# Patient Record
Sex: Female | Born: 1975 | Race: Black or African American | Hispanic: No | Smoking: Current some day smoker
Health system: Southern US, Community
[De-identification: ages and names within clinical notes are randomized; demographics above are authoritative.]

## PROBLEM LIST (undated history)

## (undated) DIAGNOSIS — R569 Unspecified convulsions: Secondary | ICD-10-CM

## (undated) DIAGNOSIS — R011 Cardiac murmur, unspecified: Secondary | ICD-10-CM

## (undated) DIAGNOSIS — Z87442 Personal history of urinary calculi: Secondary | ICD-10-CM

## (undated) DIAGNOSIS — R51 Headache: Secondary | ICD-10-CM

## (undated) DIAGNOSIS — J45909 Unspecified asthma, uncomplicated: Secondary | ICD-10-CM

## (undated) DIAGNOSIS — D649 Anemia, unspecified: Secondary | ICD-10-CM

## (undated) DIAGNOSIS — F32A Depression, unspecified: Secondary | ICD-10-CM

## (undated) DIAGNOSIS — R519 Headache, unspecified: Secondary | ICD-10-CM

## (undated) DIAGNOSIS — F329 Major depressive disorder, single episode, unspecified: Secondary | ICD-10-CM

## (undated) DIAGNOSIS — L732 Hidradenitis suppurativa: Secondary | ICD-10-CM

## (undated) HISTORY — PX: BACK SURGERY: SHX140

## (undated) HISTORY — DX: Hidradenitis suppurativa: L73.2

## (undated) HISTORY — PX: TUBAL LIGATION: SHX77

## (undated) HISTORY — PX: KIDNEY STONE SURGERY: SHX686

## (undated) HISTORY — PX: CHOLECYSTECTOMY: SHX55

---

## 1995-07-06 HISTORY — PX: APPENDECTOMY: SHX54

## 1995-07-06 HISTORY — PX: KNEE SURGERY: SHX244

## 2005-07-05 DIAGNOSIS — L732 Hidradenitis suppurativa: Secondary | ICD-10-CM

## 2005-07-05 HISTORY — DX: Hidradenitis suppurativa: L73.2

## 2005-07-05 HISTORY — PX: AXILLARY HIDRADENITIS EXCISION: SUR522

## 2007-02-27 ENCOUNTER — Emergency Department: Payer: Self-pay | Admitting: Emergency Medicine

## 2007-06-29 ENCOUNTER — Emergency Department: Payer: Self-pay | Admitting: Emergency Medicine

## 2007-08-07 ENCOUNTER — Emergency Department: Payer: Self-pay | Admitting: Emergency Medicine

## 2008-06-09 ENCOUNTER — Emergency Department: Payer: Self-pay | Admitting: Emergency Medicine

## 2009-04-19 ENCOUNTER — Emergency Department: Payer: Self-pay | Admitting: Internal Medicine

## 2009-09-25 ENCOUNTER — Emergency Department: Payer: Self-pay | Admitting: Emergency Medicine

## 2009-10-21 ENCOUNTER — Ambulatory Visit: Payer: Self-pay | Admitting: Orthopedic Surgery

## 2010-10-07 ENCOUNTER — Ambulatory Visit: Payer: Self-pay | Admitting: Internal Medicine

## 2011-01-18 ENCOUNTER — Ambulatory Visit: Payer: Self-pay | Admitting: Family

## 2011-02-01 ENCOUNTER — Ambulatory Visit: Payer: Self-pay | Admitting: Family

## 2012-05-25 ENCOUNTER — Emergency Department: Payer: Self-pay | Admitting: Emergency Medicine

## 2012-05-25 LAB — HCG, QUANTITATIVE, PREGNANCY: Beta Hcg, Quant.: <1 m[IU]/mL — ABNORMAL LOW

## 2012-05-25 LAB — ETHANOL
Ethanol %: 0.087 % — ABNORMAL HIGH (ref 0.000–0.080)
Ethanol: 87 mg/dL

## 2012-06-15 ENCOUNTER — Emergency Department: Payer: Self-pay | Admitting: Emergency Medicine

## 2012-06-15 LAB — COMPREHENSIVE METABOLIC PANEL
Albumin: 3.4 g/dL (ref 3.4–5.0)
Anion Gap: 10 (ref 7–16)
BUN: 6 mg/dL — ABNORMAL LOW (ref 7–18)
Bilirubin,Total: 0.2 mg/dL (ref 0.2–1.0)
Chloride: 111 mmol/L — ABNORMAL HIGH (ref 98–107)
Creatinine: 0.56 mg/dL — ABNORMAL LOW (ref 0.60–1.30)
EGFR (African American): >60
EGFR (Non-African Amer.): >60
Glucose: 82 mg/dL (ref 65–99)
Potassium: 3.9 mmol/L (ref 3.5–5.1)
SGOT(AST): 23 U/L (ref 15–37)
Sodium: 142 mmol/L (ref 136–145)
Total Protein: 8 g/dL (ref 6.4–8.2)

## 2012-06-15 LAB — CBC
HGB: 11.8 g/dL — ABNORMAL LOW (ref 12.0–16.0)
MCH: 32.5 pg (ref 26.0–34.0)
MCV: 95 fL (ref 80–100)
Platelet: 214 10*3/uL (ref 150–440)
RBC: 3.64 10*6/uL — ABNORMAL LOW (ref 3.80–5.20)
WBC: 10.1 10*3/uL (ref 3.6–11.0)

## 2012-06-15 LAB — ETHANOL: Ethanol %: 0.162 % — ABNORMAL HIGH (ref 0.000–0.080)

## 2012-09-04 ENCOUNTER — Ambulatory Visit: Payer: Self-pay | Admitting: Emergency Medicine

## 2012-09-04 LAB — PREGNANCY, URINE: Pregnancy Test, Urine: NEGATIVE m[IU]/mL

## 2012-10-19 ENCOUNTER — Emergency Department: Payer: Self-pay | Admitting: Emergency Medicine

## 2012-10-19 LAB — CK TOTAL AND CKMB (NOT AT ARMC): CK-MB: <0.5 ng/mL — ABNORMAL LOW (ref 0.5–3.6)

## 2012-10-19 LAB — BASIC METABOLIC PANEL
Anion Gap: 7 (ref 7–16)
BUN: 5 mg/dL — ABNORMAL LOW (ref 7–18)
Calcium, Total: 9.1 mg/dL (ref 8.5–10.1)
Chloride: 109 mmol/L — ABNORMAL HIGH (ref 98–107)
Co2: 23 mmol/L (ref 21–32)
Creatinine: 0.81 mg/dL (ref 0.60–1.30)
EGFR (African American): >60
EGFR (Non-African Amer.): >60
Glucose: 86 mg/dL (ref 65–99)
Osmolality: 274 (ref 275–301)
Sodium: 139 mmol/L (ref 136–145)

## 2012-10-19 LAB — TROPONIN I: Troponin-I: <0.02 ng/mL

## 2012-10-19 LAB — CBC
HCT: 37.1 % (ref 35.0–47.0)
Platelet: 264 10*3/uL (ref 150–440)
RBC: 3.88 10*6/uL (ref 3.80–5.20)
RDW: 13.5 % (ref 11.5–14.5)
WBC: 11.3 10*3/uL — ABNORMAL HIGH (ref 3.6–11.0)

## 2013-01-06 ENCOUNTER — Emergency Department: Payer: Self-pay | Admitting: Emergency Medicine

## 2013-04-24 ENCOUNTER — Emergency Department: Payer: Self-pay | Admitting: Emergency Medicine

## 2013-04-24 LAB — CBC
HCT: 38.8 % (ref 35.0–47.0)
MCH: 33.7 pg (ref 26.0–34.0)
MCHC: 34.8 g/dL (ref 32.0–36.0)
MCV: 97 fL (ref 80–100)
Platelet: 230 10*3/uL (ref 150–440)
RBC: 4 10*6/uL (ref 3.80–5.20)
WBC: 16.5 10*3/uL — ABNORMAL HIGH (ref 3.6–11.0)

## 2013-04-24 LAB — BASIC METABOLIC PANEL
BUN: 6 mg/dL — ABNORMAL LOW (ref 7–18)
Calcium, Total: 8.4 mg/dL — ABNORMAL LOW (ref 8.5–10.1)
Chloride: 109 mmol/L — ABNORMAL HIGH (ref 98–107)
Co2: 19 mmol/L — ABNORMAL LOW (ref 21–32)
Creatinine: 0.77 mg/dL (ref 0.60–1.30)
EGFR (African American): >60
EGFR (Non-African Amer.): >60
Glucose: 86 mg/dL (ref 65–99)
Osmolality: 271 (ref 275–301)
Potassium: 3.5 mmol/L (ref 3.5–5.1)
Sodium: 137 mmol/L (ref 136–145)

## 2013-04-28 ENCOUNTER — Ambulatory Visit: Payer: Self-pay | Admitting: Family Medicine

## 2013-10-05 ENCOUNTER — Ambulatory Visit: Payer: Self-pay | Admitting: Physician Assistant

## 2013-10-05 DIAGNOSIS — R059 Cough, unspecified: Secondary | ICD-10-CM | POA: Diagnosis not present

## 2013-10-05 DIAGNOSIS — J45909 Unspecified asthma, uncomplicated: Secondary | ICD-10-CM | POA: Diagnosis not present

## 2013-10-05 DIAGNOSIS — Z79899 Other long term (current) drug therapy: Secondary | ICD-10-CM | POA: Diagnosis not present

## 2013-10-05 DIAGNOSIS — G8929 Other chronic pain: Secondary | ICD-10-CM | POA: Diagnosis not present

## 2013-10-05 DIAGNOSIS — F17200 Nicotine dependence, unspecified, uncomplicated: Secondary | ICD-10-CM | POA: Diagnosis not present

## 2013-10-05 DIAGNOSIS — R05 Cough: Secondary | ICD-10-CM | POA: Diagnosis not present

## 2013-10-05 DIAGNOSIS — J069 Acute upper respiratory infection, unspecified: Secondary | ICD-10-CM | POA: Diagnosis not present

## 2013-11-05 DIAGNOSIS — M545 Low back pain, unspecified: Secondary | ICD-10-CM | POA: Diagnosis not present

## 2013-11-05 DIAGNOSIS — J449 Chronic obstructive pulmonary disease, unspecified: Secondary | ICD-10-CM | POA: Diagnosis not present

## 2013-11-05 DIAGNOSIS — J019 Acute sinusitis, unspecified: Secondary | ICD-10-CM | POA: Diagnosis not present

## 2013-11-05 DIAGNOSIS — R51 Headache: Secondary | ICD-10-CM | POA: Diagnosis not present

## 2013-11-27 ENCOUNTER — Ambulatory Visit: Payer: Self-pay | Admitting: Physician Assistant

## 2013-11-27 DIAGNOSIS — Z79899 Other long term (current) drug therapy: Secondary | ICD-10-CM | POA: Diagnosis not present

## 2013-11-27 DIAGNOSIS — S060X9A Concussion with loss of consciousness of unspecified duration, initial encounter: Secondary | ICD-10-CM | POA: Diagnosis not present

## 2013-11-27 DIAGNOSIS — H9319 Tinnitus, unspecified ear: Secondary | ICD-10-CM | POA: Diagnosis not present

## 2013-11-27 DIAGNOSIS — S060XAA Concussion with loss of consciousness status unknown, initial encounter: Secondary | ICD-10-CM | POA: Diagnosis not present

## 2013-11-27 DIAGNOSIS — R42 Dizziness and giddiness: Secondary | ICD-10-CM | POA: Diagnosis not present

## 2013-11-27 DIAGNOSIS — J45909 Unspecified asthma, uncomplicated: Secondary | ICD-10-CM | POA: Diagnosis not present

## 2013-11-27 DIAGNOSIS — S0990XA Unspecified injury of head, initial encounter: Secondary | ICD-10-CM | POA: Diagnosis not present

## 2013-11-27 DIAGNOSIS — Z9851 Tubal ligation status: Secondary | ICD-10-CM | POA: Diagnosis not present

## 2013-11-27 DIAGNOSIS — R51 Headache: Secondary | ICD-10-CM | POA: Diagnosis not present

## 2013-11-27 DIAGNOSIS — H538 Other visual disturbances: Secondary | ICD-10-CM | POA: Diagnosis not present

## 2013-11-27 DIAGNOSIS — Z9089 Acquired absence of other organs: Secondary | ICD-10-CM | POA: Diagnosis not present

## 2013-12-07 ENCOUNTER — Emergency Department: Payer: Self-pay | Admitting: Emergency Medicine

## 2013-12-07 DIAGNOSIS — IMO0002 Reserved for concepts with insufficient information to code with codable children: Secondary | ICD-10-CM | POA: Diagnosis not present

## 2013-12-07 DIAGNOSIS — Z79899 Other long term (current) drug therapy: Secondary | ICD-10-CM | POA: Diagnosis not present

## 2013-12-07 DIAGNOSIS — Z9089 Acquired absence of other organs: Secondary | ICD-10-CM | POA: Diagnosis not present

## 2013-12-07 DIAGNOSIS — M5430 Sciatica, unspecified side: Secondary | ICD-10-CM | POA: Diagnosis not present

## 2013-12-07 DIAGNOSIS — F17200 Nicotine dependence, unspecified, uncomplicated: Secondary | ICD-10-CM | POA: Diagnosis not present

## 2013-12-19 ENCOUNTER — Emergency Department: Payer: Self-pay | Admitting: Internal Medicine

## 2013-12-19 DIAGNOSIS — L02219 Cutaneous abscess of trunk, unspecified: Secondary | ICD-10-CM | POA: Diagnosis not present

## 2013-12-19 DIAGNOSIS — R52 Pain, unspecified: Secondary | ICD-10-CM | POA: Diagnosis not present

## 2013-12-19 DIAGNOSIS — J45909 Unspecified asthma, uncomplicated: Secondary | ICD-10-CM | POA: Diagnosis not present

## 2013-12-19 DIAGNOSIS — R109 Unspecified abdominal pain: Secondary | ICD-10-CM | POA: Diagnosis not present

## 2013-12-19 DIAGNOSIS — Z9089 Acquired absence of other organs: Secondary | ICD-10-CM | POA: Diagnosis not present

## 2013-12-19 DIAGNOSIS — Z79899 Other long term (current) drug therapy: Secondary | ICD-10-CM | POA: Diagnosis not present

## 2013-12-19 DIAGNOSIS — R1110 Vomiting, unspecified: Secondary | ICD-10-CM | POA: Diagnosis not present

## 2013-12-19 DIAGNOSIS — N949 Unspecified condition associated with female genital organs and menstrual cycle: Secondary | ICD-10-CM | POA: Diagnosis not present

## 2013-12-19 LAB — CBC WITH DIFFERENTIAL/PLATELET
BASOS ABS: 0.1 10*3/uL (ref 0.0–0.1)
Basophil %: 0.9 %
EOS PCT: 1.8 %
Eosinophil #: 0.2 10*3/uL (ref 0.0–0.7)
HCT: 39.8 % (ref 35.0–47.0)
HGB: 13.4 g/dL (ref 12.0–16.0)
LYMPHS PCT: 20.8 %
Lymphocyte #: 2.7 10*3/uL (ref 1.0–3.6)
MCH: 32.7 pg (ref 26.0–34.0)
MCHC: 33.6 g/dL (ref 32.0–36.0)
MCV: 97 fL (ref 80–100)
Monocyte #: 0.7 x10 3/mm (ref 0.2–0.9)
Monocyte %: 5.6 %
Neutrophil #: 9.1 10*3/uL — ABNORMAL HIGH (ref 1.4–6.5)
Neutrophil %: 70.9 %
PLATELETS: 274 10*3/uL (ref 150–440)
RBC: 4.09 10*6/uL (ref 3.80–5.20)
RDW: 12.5 % (ref 11.5–14.5)
WBC: 12.9 10*3/uL — AB (ref 3.6–11.0)

## 2013-12-19 LAB — COMPREHENSIVE METABOLIC PANEL
ALBUMIN: 3.5 g/dL (ref 3.4–5.0)
ALK PHOS: 115 U/L
Anion Gap: 8 (ref 7–16)
BUN: 5 mg/dL — AB (ref 7–18)
Bilirubin,Total: 0.6 mg/dL (ref 0.2–1.0)
CALCIUM: 9.3 mg/dL (ref 8.5–10.1)
Chloride: 102 mmol/L (ref 98–107)
Co2: 24 mmol/L (ref 21–32)
Creatinine: 0.81 mg/dL (ref 0.60–1.30)
EGFR (African American): >60
Glucose: 84 mg/dL (ref 65–99)
Osmolality: 265 (ref 275–301)
Potassium: 3.7 mmol/L (ref 3.5–5.1)
SGOT(AST): 14 U/L — ABNORMAL LOW (ref 15–37)
SGPT (ALT): 11 U/L — ABNORMAL LOW (ref 12–78)
Sodium: 134 mmol/L — ABNORMAL LOW (ref 136–145)
Total Protein: 8.5 g/dL — ABNORMAL HIGH (ref 6.4–8.2)

## 2013-12-19 LAB — URINALYSIS, COMPLETE
BACTERIA: NONE SEEN
Bilirubin,UR: NEGATIVE
Blood: NEGATIVE
Glucose,UR: NEGATIVE mg/dL (ref 0–75)
Nitrite: NEGATIVE
PH: 5 (ref 4.5–8.0)
PROTEIN: NEGATIVE
RBC,UR: 7 /HPF (ref 0–5)
SPECIFIC GRAVITY: 1.021 (ref 1.003–1.030)

## 2013-12-19 LAB — TROPONIN I

## 2013-12-19 LAB — LIPASE, BLOOD: LIPASE: 86 U/L (ref 73–393)

## 2014-02-25 DIAGNOSIS — M5137 Other intervertebral disc degeneration, lumbosacral region: Secondary | ICD-10-CM | POA: Diagnosis not present

## 2014-02-25 DIAGNOSIS — R0789 Other chest pain: Secondary | ICD-10-CM | POA: Diagnosis not present

## 2014-02-25 DIAGNOSIS — R079 Chest pain, unspecified: Secondary | ICD-10-CM | POA: Diagnosis not present

## 2014-03-01 DIAGNOSIS — N946 Dysmenorrhea, unspecified: Secondary | ICD-10-CM | POA: Diagnosis not present

## 2014-03-26 DIAGNOSIS — L0501 Pilonidal cyst with abscess: Secondary | ICD-10-CM | POA: Diagnosis not present

## 2014-04-02 ENCOUNTER — Encounter: Payer: Self-pay | Admitting: General Surgery

## 2014-04-02 ENCOUNTER — Ambulatory Visit (INDEPENDENT_AMBULATORY_CARE_PROVIDER_SITE_OTHER): Payer: Medicare Other | Admitting: General Surgery

## 2014-04-02 VITALS — BP 122/78 | HR 78 | Resp 14 | Ht 64.0 in | Wt 197.0 lb

## 2014-04-02 DIAGNOSIS — L0501 Pilonidal cyst with abscess: Secondary | ICD-10-CM | POA: Diagnosis not present

## 2014-04-02 DIAGNOSIS — L732 Hidradenitis suppurativa: Secondary | ICD-10-CM

## 2014-04-02 NOTE — Progress Notes (Signed)
Patient ID: Melissa [name], female   DOB: 09/10/1975, 38 y.o.   MRN: [id number]  Chief Complaint  Patient presents with  . Other    New Pt evaluation abscess on tailbone    HPI Melissa [name] is a 38 y.o. female here today for a evaluation of a abscess on tailbone.  Patient is on Keflex four times daily. The patient states she noticed on Sunday 03/24/14. She complains of pain in this area. The area feels like it is starting to get larger in size. She has had some drainage in this area. She also states she has an area located in the pubic area. The left pubic area has been more chronic.   HPI  Past Medical History  Diagnosis Date  . Myocardial infarction 2014  . Hidradenitis 2007    Past Surgical History  Procedure Laterality Date  . Appendectomy  1997  . Knee surgery Left 1997  . Axillary hidradenitis excision Bilateral 2007    History reviewed. No pertinent family history.  Social History History  Substance Use Topics  . Smoking status: Current Every Day Smoker -- 1.00 packs/day for 10 years  . Smokeless tobacco: Not on file  . Alcohol Use: Yes    Allergies  Allergen Reactions  . Tylenol [Acetaminophen] Hives    Current Outpatient Prescriptions  Medication Sig Dispense Refill  . cephALEXin (KEFLEX) 500 MG capsule Take 1 capsule by mouth 4 (four) times daily. For 10 days      . HYDROcodone-acetaminophen (NORCO) 7.5-325 MG per tablet Take 1 tablet by mouth 2 (two) times daily.      . naproxen sodium (ANAPROX) 220 MG tablet Take 440 mg by mouth 2 (two) times daily with a meal.      . traMADol (ULTRAM) 50 MG tablet Take 1 tablet by mouth 2 (two) times daily.       No current facility-administered medications for this visit.    Review of Systems Review of Systems  Constitutional: Negative.   Respiratory: Negative.   Cardiovascular: Negative.     Blood pressure 122/78, pulse 78, resp. rate 14, height 5\' 4"  (1.626 m), weight 197 lb (89.359 kg), last menstrual period  04/02/2014.  Physical Exam Physical Exam  Constitutional: She is oriented to person, place, and time. She appears well-developed and well-nourished.  Lymphadenopathy:       Right: No inguinal adenopathy present.       Left: No inguinal adenopathy present.  Neurological: She is alert and oriented to person, place, and time.   Small area of hidradenits in left pubic area.  Has a 4cm induration with soft middle in upper gluteal cleft.  Data Reviewed PCP notes  Assessment    Pilonidal abscess and focal hidradenitis pubic area. Rcommended drainage of pilonidal abscess and completed with her consent.    Plan   2 ml 1% xylocaine used. Betadine prep. Cruciate incision made and pus and blood drained.  No immediate problems from procedure.          [name] 04/03/2014, 11:19 AM

## 2014-04-02 NOTE — Patient Instructions (Signed)
Patient to return in 3 weeks for follow up. The patient is aware to call back for any questions or concerns.  

## 2014-04-03 ENCOUNTER — Encounter: Payer: Self-pay | Admitting: General Surgery

## 2014-04-05 DIAGNOSIS — L0502 Pilonidal sinus with abscess: Secondary | ICD-10-CM | POA: Diagnosis not present

## 2014-04-24 ENCOUNTER — Ambulatory Visit: Payer: Medicare Other | Admitting: General Surgery

## 2014-05-05 ENCOUNTER — Emergency Department: Payer: Self-pay | Admitting: Emergency Medicine

## 2014-05-05 DIAGNOSIS — F918 Other conduct disorders: Secondary | ICD-10-CM | POA: Diagnosis not present

## 2014-05-05 DIAGNOSIS — R401 Stupor: Secondary | ICD-10-CM | POA: Diagnosis not present

## 2014-05-05 DIAGNOSIS — R079 Chest pain, unspecified: Secondary | ICD-10-CM | POA: Diagnosis not present

## 2014-05-05 DIAGNOSIS — R410 Disorientation, unspecified: Secondary | ICD-10-CM | POA: Diagnosis not present

## 2014-05-05 DIAGNOSIS — R569 Unspecified convulsions: Secondary | ICD-10-CM | POA: Diagnosis not present

## 2014-05-05 DIAGNOSIS — F419 Anxiety disorder, unspecified: Secondary | ICD-10-CM | POA: Diagnosis not present

## 2014-05-05 DIAGNOSIS — J45901 Unspecified asthma with (acute) exacerbation: Secondary | ICD-10-CM | POA: Diagnosis not present

## 2014-05-05 LAB — COMPREHENSIVE METABOLIC PANEL
ALK PHOS: 90 U/L
ANION GAP: 11 (ref 7–16)
AST: 23 U/L (ref 15–37)
Albumin: 3.3 g/dL — ABNORMAL LOW (ref 3.4–5.0)
BUN: 7 mg/dL (ref 7–18)
Bilirubin,Total: 0.2 mg/dL (ref 0.2–1.0)
Calcium, Total: 8.2 mg/dL — ABNORMAL LOW (ref 8.5–10.1)
Chloride: 112 mmol/L — ABNORMAL HIGH (ref 98–107)
Co2: 22 mmol/L (ref 21–32)
Creatinine: 0.8 mg/dL (ref 0.60–1.30)
EGFR (Non-African Amer.): >60
GLUCOSE: 85 mg/dL (ref 65–99)
OSMOLALITY: 286 (ref 275–301)
POTASSIUM: 3.4 mmol/L — AB (ref 3.5–5.1)
SGPT (ALT): 15 U/L
SODIUM: 145 mmol/L (ref 136–145)
Total Protein: 7.7 g/dL (ref 6.4–8.2)

## 2014-05-05 LAB — CK TOTAL AND CKMB (NOT AT ARMC)
CK, TOTAL: 422 U/L — AB
CK-MB: 1.5 ng/mL (ref 0.5–3.6)

## 2014-05-05 LAB — CBC
HCT: 36.9 % (ref 35.0–47.0)
HGB: 12.1 g/dL (ref 12.0–16.0)
MCH: 32.1 pg (ref 26.0–34.0)
MCHC: 32.9 g/dL (ref 32.0–36.0)
MCV: 98 fL (ref 80–100)
PLATELETS: 269 10*3/uL (ref 150–440)
RBC: 3.78 10*6/uL — ABNORMAL LOW (ref 3.80–5.20)
RDW: 13.3 % (ref 11.5–14.5)
WBC: 11.3 10*3/uL — ABNORMAL HIGH (ref 3.6–11.0)

## 2014-05-05 LAB — TROPONIN I

## 2014-05-05 LAB — ETHANOL: ETHANOL LVL: 171 mg/dL

## 2014-05-06 LAB — URINALYSIS, COMPLETE
BACTERIA: NONE SEEN
BILIRUBIN, UR: NEGATIVE
BLOOD: NEGATIVE
GLUCOSE, UR: NEGATIVE mg/dL (ref 0–75)
Ketone: NEGATIVE
Nitrite: NEGATIVE
Ph: 5 (ref 4.5–8.0)
Protein: NEGATIVE
RBC,UR: 1 /HPF (ref 0–5)
Specific Gravity: 1.01 (ref 1.003–1.030)
Squamous Epithelial: <1
WBC UR: 2 /HPF (ref 0–5)

## 2014-05-06 LAB — DRUG SCREEN, URINE
AMPHETAMINES, UR SCREEN: NEGATIVE (ref ?–1000)
BARBITURATES, UR SCREEN: NEGATIVE (ref ?–200)
Benzodiazepine, Ur Scrn: NEGATIVE (ref ?–200)
CANNABINOID 50 NG, UR ~~LOC~~: NEGATIVE (ref ?–50)
COCAINE METABOLITE, UR ~~LOC~~: NEGATIVE (ref ?–300)
MDMA (Ecstasy)Ur Screen: NEGATIVE (ref ?–500)
Methadone, Ur Screen: NEGATIVE (ref ?–300)
Opiate, Ur Screen: NEGATIVE (ref ?–300)
PHENCYCLIDINE (PCP) UR S: NEGATIVE (ref ?–25)
Tricyclic, Ur Screen: NEGATIVE (ref ?–1000)

## 2014-05-06 LAB — TROPONIN I: Troponin-I: <0.02 ng/mL

## 2014-05-07 ENCOUNTER — Encounter: Payer: Self-pay | Admitting: General Surgery

## 2014-05-07 ENCOUNTER — Ambulatory Visit (INDEPENDENT_AMBULATORY_CARE_PROVIDER_SITE_OTHER): Payer: Medicare Other | Admitting: General Surgery

## 2014-05-07 VITALS — BP 128/72 | HR 76 | Resp 12 | Ht 64.0 in | Wt 197.0 lb

## 2014-05-07 DIAGNOSIS — L732 Hidradenitis suppurativa: Secondary | ICD-10-CM | POA: Diagnosis not present

## 2014-05-07 MED ORDER — MINOCYCLINE HCL 100 MG PO CAPS: 100.0000 mg | ORAL_CAPSULE | Freq: Every day | ORAL | Status: DC

## 2014-05-07 NOTE — Progress Notes (Signed)
Patient ID: Melissa [name], female   DOB: 1976-03-10, 38 y.o.   MRN: [id number]  Chief Complaint  Patient presents with  . Follow-up    pilonidal cyst    HPI Melissa [name] is a 38 y.o. female. here today following up from an pilonidal cyst abcsess. Patient states the area is itching but not draining. She has an area in the labia  That is still draining.                              HPI  Past Medical History  Diagnosis Date  . Myocardial infarction 2014  . Hidradenitis 2007    Past Surgical History  Procedure Laterality Date  . Appendectomy  1997  . Knee surgery Left 1997  . Axillary hidradenitis excision Bilateral 2007    History reviewed. No pertinent family history.  Social History History  Substance Use Topics  . Smoking status: Current Every Day Smoker -- 1.00 packs/day for 10 years  . Smokeless tobacco: Not on file  . Alcohol Use: Yes    Allergies  Allergen Reactions  . Tylenol [Acetaminophen] Hives    Current Outpatient Prescriptions  Medication Sig Dispense Refill  . cephALEXin (KEFLEX) 500 MG capsule Take 1 capsule by mouth 4 (four) times daily. For 10 days    . HYDROcodone-acetaminophen (NORCO) 7.5-325 MG per tablet Take 1 tablet by mouth 2 (two) times daily.    . naproxen sodium (ANAPROX) 220 MG tablet Take 440 mg by mouth 2 (two) times daily with a meal.    . traMADol (ULTRAM) 50 MG tablet Take 1 tablet by mouth 2 (two) times daily.    . minocycline (MINOCIN,DYNACIN) 100 MG capsule Take 1 capsule (100 mg total) by mouth daily. 30 capsule 0   No current facility-administered medications for this visit.    Review of Systems Review of Systems  Constitutional: Negative.   Respiratory: Negative.   Cardiovascular: Negative.      Blood pressure 128/72, pulse 76, resp. rate 12, height 5\' 4"  (1.626 m), weight 197 lb (89.359 kg).  Physical Exam Physical Exam Pilonidal area is fully healed. Still has active 5cm area of hidradenitis left pubic skin.  One  spot of pus noted, culture obtained. Data Reviewed None  Assessment    Healed pilonidal abscess . Hidradenitis left public area. Doxycyline 100 mg daily for one month    Plan    Patient to return in one month       SANKAR,SEEPLAPUTHUR G 05/07/2014, 8:03 PM

## 2014-05-07 NOTE — Patient Instructions (Signed)
Patient to return in one month. 

## 2014-05-13 LAB — ANAEROBIC AND AEROBIC CULTURE

## 2014-05-30 DIAGNOSIS — M79604 Pain in right leg: Secondary | ICD-10-CM | POA: Diagnosis not present

## 2014-05-30 DIAGNOSIS — M549 Dorsalgia, unspecified: Secondary | ICD-10-CM | POA: Diagnosis not present

## 2014-05-30 DIAGNOSIS — R0782 Intercostal pain: Secondary | ICD-10-CM | POA: Diagnosis not present

## 2014-06-01 ENCOUNTER — Emergency Department: Payer: Self-pay | Admitting: Emergency Medicine

## 2014-06-01 DIAGNOSIS — R079 Chest pain, unspecified: Secondary | ICD-10-CM | POA: Diagnosis not present

## 2014-06-01 DIAGNOSIS — R0789 Other chest pain: Secondary | ICD-10-CM | POA: Diagnosis not present

## 2014-06-01 DIAGNOSIS — Z3202 Encounter for pregnancy test, result negative: Secondary | ICD-10-CM | POA: Diagnosis not present

## 2014-06-01 DIAGNOSIS — F329 Major depressive disorder, single episode, unspecified: Secondary | ICD-10-CM | POA: Diagnosis not present

## 2014-06-01 DIAGNOSIS — M549 Dorsalgia, unspecified: Secondary | ICD-10-CM | POA: Diagnosis not present

## 2014-06-01 DIAGNOSIS — Z791 Long term (current) use of non-steroidal anti-inflammatories (NSAID): Secondary | ICD-10-CM | POA: Diagnosis not present

## 2014-06-01 DIAGNOSIS — F419 Anxiety disorder, unspecified: Secondary | ICD-10-CM | POA: Diagnosis not present

## 2014-06-01 DIAGNOSIS — R109 Unspecified abdominal pain: Secondary | ICD-10-CM | POA: Diagnosis not present

## 2014-06-01 DIAGNOSIS — Z792 Long term (current) use of antibiotics: Secondary | ICD-10-CM | POA: Diagnosis not present

## 2014-06-01 DIAGNOSIS — Z79891 Long term (current) use of opiate analgesic: Secondary | ICD-10-CM | POA: Diagnosis not present

## 2014-06-01 DIAGNOSIS — Z720 Tobacco use: Secondary | ICD-10-CM | POA: Diagnosis not present

## 2014-06-01 DIAGNOSIS — Z79899 Other long term (current) drug therapy: Secondary | ICD-10-CM | POA: Diagnosis not present

## 2014-06-01 LAB — DRUG SCREEN, URINE
Amphetamines, Ur Screen: NEGATIVE (ref ?–1000)
Barbiturates, Ur Screen: NEGATIVE (ref ?–200)
Benzodiazepine, Ur Scrn: NEGATIVE (ref ?–200)
COCAINE METABOLITE, UR ~~LOC~~: NEGATIVE (ref ?–300)
Cannabinoid 50 Ng, Ur ~~LOC~~: NEGATIVE (ref ?–50)
MDMA (ECSTASY) UR SCREEN: NEGATIVE (ref ?–500)
Methadone, Ur Screen: NEGATIVE (ref ?–300)
Opiate, Ur Screen: NEGATIVE (ref ?–300)
PHENCYCLIDINE (PCP) UR S: NEGATIVE (ref ?–25)
Tricyclic, Ur Screen: NEGATIVE (ref ?–1000)

## 2014-06-01 LAB — COMPREHENSIVE METABOLIC PANEL
ALBUMIN: 3.3 g/dL — AB (ref 3.4–5.0)
ALK PHOS: 94 U/L
ALT: 16 U/L
Anion Gap: 7 (ref 7–16)
BILIRUBIN TOTAL: 0.1 mg/dL — AB (ref 0.2–1.0)
BUN: 11 mg/dL (ref 7–18)
Calcium, Total: 8.5 mg/dL (ref 8.5–10.1)
Chloride: 109 mmol/L — ABNORMAL HIGH (ref 98–107)
Co2: 25 mmol/L (ref 21–32)
Creatinine: 0.75 mg/dL (ref 0.60–1.30)
EGFR (African American): >60
GLUCOSE: 82 mg/dL (ref 65–99)
Osmolality: 280 (ref 275–301)
POTASSIUM: 3.9 mmol/L (ref 3.5–5.1)
SGOT(AST): 22 U/L (ref 15–37)
SODIUM: 141 mmol/L (ref 136–145)
TOTAL PROTEIN: 7.9 g/dL (ref 6.4–8.2)

## 2014-06-01 LAB — TROPONIN I
Troponin-I: <0.02 ng/mL
Troponin-I: <0.02 ng/mL

## 2014-06-01 LAB — ETHANOL: Ethanol: 130 mg/dL

## 2014-06-01 LAB — URINALYSIS, COMPLETE
BACTERIA: NONE SEEN
Bilirubin,UR: NEGATIVE
Glucose,UR: NEGATIVE mg/dL (ref 0–75)
KETONE: NEGATIVE
Nitrite: NEGATIVE
Ph: 6 (ref 4.5–8.0)
Protein: NEGATIVE
RBC,UR: <1 /HPF (ref 0–5)
SPECIFIC GRAVITY: 1.003 (ref 1.003–1.030)
WBC UR: 5 /HPF (ref 0–5)

## 2014-06-01 LAB — PREGNANCY, URINE: Pregnancy Test, Urine: NEGATIVE m[IU]/mL

## 2014-06-01 LAB — CBC
HCT: 37.2 % (ref 35.0–47.0)
HGB: 12.4 g/dL (ref 12.0–16.0)
MCH: 33.2 pg (ref 26.0–34.0)
MCHC: 33.4 g/dL (ref 32.0–36.0)
MCV: 99 fL (ref 80–100)
PLATELETS: 241 10*3/uL (ref 150–440)
RBC: 3.75 10*6/uL — ABNORMAL LOW (ref 3.80–5.20)
RDW: 13.5 % (ref 11.5–14.5)
WBC: 12.8 10*3/uL — ABNORMAL HIGH (ref 3.6–11.0)

## 2014-06-01 LAB — LIPASE, BLOOD: LIPASE: 179 U/L (ref 73–393)

## 2014-06-05 ENCOUNTER — Ambulatory Visit: Payer: Medicare Other | Admitting: General Surgery

## 2014-06-05 ENCOUNTER — Ambulatory Visit (INDEPENDENT_AMBULATORY_CARE_PROVIDER_SITE_OTHER): Payer: Medicare Other | Admitting: General Surgery

## 2014-06-05 ENCOUNTER — Encounter: Payer: Self-pay | Admitting: General Surgery

## 2014-06-05 VITALS — BP 130/74 | HR 76 | Resp 14 | Ht 64.0 in | Wt 191.0 lb

## 2014-06-05 DIAGNOSIS — L732 Hidradenitis suppurativa: Secondary | ICD-10-CM | POA: Diagnosis not present

## 2014-06-05 NOTE — Patient Instructions (Signed)
The patient is aware to call back for any questions or concerns.  

## 2014-06-05 NOTE — Addendum Note (Signed)
Addended by: Kieth BrightlySANKAR, SEEPLAPUTHUR G on: 06/05/2014 01:14 PM   Modules accepted: Orders

## 2014-06-05 NOTE — Progress Notes (Signed)
Patient ID: Melissa [name], female   DOB: [birth year], 38 y.o.   MRN: [id number]  The patient presents for a 1 month follow up of hidradenitis. The patient states she is having pain and drainage in these areas. Still taking doxycycline.  Fair amount of induration left pubic area     PE Heart  -NSR, No murmurs Neck: no masses Lungs: clear  pubic area: R side appears normal with healed areas of hidradenitis in the past. Left side with moderate induration encroaching on left labial region, couple of small spots of drainage.  Impression Active hidradenitis left pubic region. Failed antibiotic treatment Discussed surgical option as well as importance of hygiene Patient is agreeable to surgical excision. Procedure risks and benefits explained

## 2014-06-06 ENCOUNTER — Other Ambulatory Visit: Payer: Self-pay | Admitting: General Surgery

## 2014-06-06 NOTE — Telephone Encounter (Signed)
She is not sure why we got a refill request. Surgery scheduled for 06-18-14. She will need work note. How long will she be out 2 or will it be 3 weeks?

## 2014-06-10 ENCOUNTER — Ambulatory Visit: Payer: Self-pay | Admitting: General Surgery

## 2014-06-18 ENCOUNTER — Ambulatory Visit: Payer: Self-pay | Admitting: General Surgery

## 2014-06-18 ENCOUNTER — Encounter: Payer: Self-pay | Admitting: General Surgery

## 2014-06-18 DIAGNOSIS — Z720 Tobacco use: Secondary | ICD-10-CM | POA: Diagnosis not present

## 2014-06-18 DIAGNOSIS — I252 Old myocardial infarction: Secondary | ICD-10-CM | POA: Diagnosis not present

## 2014-06-18 DIAGNOSIS — M549 Dorsalgia, unspecified: Secondary | ICD-10-CM | POA: Diagnosis not present

## 2014-06-18 DIAGNOSIS — L732 Hidradenitis suppurativa: Secondary | ICD-10-CM | POA: Diagnosis not present

## 2014-06-18 DIAGNOSIS — N830 Follicular cyst of ovary: Secondary | ICD-10-CM | POA: Diagnosis not present

## 2014-06-18 DIAGNOSIS — E669 Obesity, unspecified: Secondary | ICD-10-CM | POA: Diagnosis not present

## 2014-06-18 HISTORY — PX: INGUINAL HIDRADENITIS EXCISION: SHX1827

## 2014-06-20 ENCOUNTER — Encounter: Payer: Self-pay | Admitting: General Surgery

## 2014-06-20 ENCOUNTER — Telehealth: Payer: Self-pay | Admitting: General Surgery

## 2014-06-20 NOTE — Telephone Encounter (Signed)
06-20-14 PT CALLED IN AND WANTED TO KNOW IF SHE SHOULD BE TAKING BOTH MINOCYCLINE 100 MG & DOXYCYCLINE 100 MG? SHE ALSO REPORTS HER LT LEG KEEPS GOING NUMB.WHEN IT DOES SHE HAS TROUBLE WALKING & MOVING HER LEG. SHE WOULD LIKE TO KNOW IF SHE HAS DISSOLVABLE SUTURES?

## 2014-06-25 ENCOUNTER — Ambulatory Visit (INDEPENDENT_AMBULATORY_CARE_PROVIDER_SITE_OTHER): Payer: Self-pay | Admitting: General Surgery

## 2014-06-25 ENCOUNTER — Encounter: Payer: Self-pay | Admitting: General Surgery

## 2014-06-25 VITALS — BP 130/68 | HR 82 | Resp 14 | Ht 64.0 in | Wt 191.0 lb

## 2014-06-25 DIAGNOSIS — L732 Hidradenitis suppurativa: Secondary | ICD-10-CM

## 2014-06-25 NOTE — Progress Notes (Signed)
This is a 38 year old female here today for her post op hidradenitis groin area done on 06/18/14. Patient states she is still very sore.   The incision is relatively clean. Upper stiches removed. 5 stitches left to be removed next week.  May soak in warm tub water. Complete antibiotics this week. New RX for Percocet.

## 2014-06-25 NOTE — Patient Instructions (Signed)
Warm bath soaks

## 2014-06-26 ENCOUNTER — Encounter: Payer: Self-pay | Admitting: General Surgery

## 2014-07-02 ENCOUNTER — Ambulatory Visit: Payer: Medicare Other

## 2014-07-03 ENCOUNTER — Ambulatory Visit (INDEPENDENT_AMBULATORY_CARE_PROVIDER_SITE_OTHER): Payer: Self-pay | Admitting: *Deleted

## 2014-07-03 DIAGNOSIS — L732 Hidradenitis suppurativa: Secondary | ICD-10-CM

## 2014-07-03 NOTE — Progress Notes (Signed)
Patient came in today for a wound check/suture removal.  The wound is clean, the top and lower portion of incision is open. Follow up as scheduled.

## 2014-07-03 NOTE — Patient Instructions (Signed)
Follow up as scheduled.  

## 2014-07-15 ENCOUNTER — Encounter: Payer: Self-pay | Admitting: General Surgery

## 2014-07-15 ENCOUNTER — Ambulatory Visit (INDEPENDENT_AMBULATORY_CARE_PROVIDER_SITE_OTHER): Payer: Self-pay | Admitting: General Surgery

## 2014-07-15 VITALS — BP 124/72 | HR 100 | Resp 14 | Ht 64.0 in | Wt 191.0 lb

## 2014-07-15 DIAGNOSIS — L732 Hidradenitis suppurativa: Secondary | ICD-10-CM

## 2014-07-15 NOTE — Progress Notes (Signed)
This is a 39 year old female here today for her post op excuision hidradenitis groin area done on 06/18/14. Incision looks clean and healing well. Only area that is minimally open is in the middle.  No induration or any drainage suggesting infection. Patient to return in 1 mo.

## 2014-07-15 NOTE — Patient Instructions (Signed)
Patient to return in one month. 

## 2014-07-26 ENCOUNTER — Emergency Department: Payer: Self-pay | Admitting: Emergency Medicine

## 2014-07-26 DIAGNOSIS — Z720 Tobacco use: Secondary | ICD-10-CM | POA: Diagnosis not present

## 2014-07-26 DIAGNOSIS — Z79899 Other long term (current) drug therapy: Secondary | ICD-10-CM | POA: Diagnosis not present

## 2014-07-26 DIAGNOSIS — Z3202 Encounter for pregnancy test, result negative: Secondary | ICD-10-CM | POA: Diagnosis not present

## 2014-07-26 DIAGNOSIS — Z9049 Acquired absence of other specified parts of digestive tract: Secondary | ICD-10-CM | POA: Diagnosis not present

## 2014-07-26 DIAGNOSIS — N12 Tubulo-interstitial nephritis, not specified as acute or chronic: Secondary | ICD-10-CM | POA: Diagnosis not present

## 2014-07-26 DIAGNOSIS — R591 Generalized enlarged lymph nodes: Secondary | ICD-10-CM | POA: Diagnosis not present

## 2014-07-26 DIAGNOSIS — R1031 Right lower quadrant pain: Secondary | ICD-10-CM | POA: Diagnosis not present

## 2014-07-26 DIAGNOSIS — Z9089 Acquired absence of other organs: Secondary | ICD-10-CM | POA: Diagnosis not present

## 2014-07-26 DIAGNOSIS — N390 Urinary tract infection, site not specified: Secondary | ICD-10-CM | POA: Diagnosis not present

## 2014-07-26 DIAGNOSIS — Z79891 Long term (current) use of opiate analgesic: Secondary | ICD-10-CM | POA: Diagnosis not present

## 2014-07-26 LAB — COMPREHENSIVE METABOLIC PANEL
ALK PHOS: 93 U/L
Albumin: 3.5 g/dL (ref 3.4–5.0)
Anion Gap: 7 (ref 7–16)
BUN: 10 mg/dL (ref 7–18)
Bilirubin,Total: 0.3 mg/dL (ref 0.2–1.0)
CREATININE: 0.9 mg/dL (ref 0.60–1.30)
Calcium, Total: 9 mg/dL (ref 8.5–10.1)
Chloride: 107 mmol/L (ref 98–107)
Co2: 25 mmol/L (ref 21–32)
Glucose: 83 mg/dL (ref 65–99)
OSMOLALITY: 276 (ref 275–301)
Potassium: 4.2 mmol/L (ref 3.5–5.1)
SGOT(AST): 20 U/L (ref 15–37)
SGPT (ALT): 14 U/L
SODIUM: 139 mmol/L (ref 136–145)
Total Protein: 8.4 g/dL — ABNORMAL HIGH (ref 6.4–8.2)

## 2014-07-26 LAB — CBC
HCT: 44.4 % (ref 35.0–47.0)
HGB: 14.7 g/dL (ref 12.0–16.0)
MCH: 33 pg (ref 26.0–34.0)
MCHC: 33.1 g/dL (ref 32.0–36.0)
MCV: 100 fL (ref 80–100)
Platelet: 287 10*3/uL (ref 150–440)
RBC: 4.45 10*6/uL (ref 3.80–5.20)
RDW: 13 % (ref 11.5–14.5)
WBC: 12 10*3/uL — AB (ref 3.6–11.0)

## 2014-07-26 LAB — URINALYSIS, COMPLETE
Bacteria: NONE SEEN
Bilirubin,UR: NEGATIVE
GLUCOSE, UR: NEGATIVE mg/dL (ref 0–75)
Ketone: NEGATIVE
NITRITE: NEGATIVE
Ph: 5 (ref 4.5–8.0)
Protein: NEGATIVE
RBC,UR: 8 /HPF (ref 0–5)
Specific Gravity: 1.026 (ref 1.003–1.030)
Squamous Epithelial: 3

## 2014-07-26 LAB — TROPONIN I: Troponin-I: <0.02 ng/mL

## 2014-08-14 ENCOUNTER — Ambulatory Visit (INDEPENDENT_AMBULATORY_CARE_PROVIDER_SITE_OTHER): Payer: Self-pay | Admitting: General Surgery

## 2014-08-14 ENCOUNTER — Encounter: Payer: Self-pay | Admitting: General Surgery

## 2014-08-14 VITALS — BP 124/82 | HR 72 | Resp 14 | Ht 64.0 in | Wt 195.0 lb

## 2014-08-14 DIAGNOSIS — L732 Hidradenitis suppurativa: Secondary | ICD-10-CM

## 2014-08-14 NOTE — Patient Instructions (Signed)
The patient is aware to call back for any questions or concerns.  

## 2014-08-14 NOTE — Progress Notes (Signed)
This is a 39 year old female here today for her post op excuision hidradenitis groin area done on 06/18/14. She is recovering from a kidney infection 08-04-14 and has completed the Levaquin the ED gave her.  Incision is intact and well healed. Pt instructed to keep area clean and dry.  PT to return to work 08/19/14.

## 2014-08-15 ENCOUNTER — Encounter: Payer: Self-pay | Admitting: General Surgery

## 2014-08-26 DIAGNOSIS — M5136 Other intervertebral disc degeneration, lumbar region: Secondary | ICD-10-CM | POA: Diagnosis not present

## 2014-08-26 DIAGNOSIS — M542 Cervicalgia: Secondary | ICD-10-CM | POA: Diagnosis not present

## 2014-08-26 DIAGNOSIS — M65812 Other synovitis and tenosynovitis, left shoulder: Secondary | ICD-10-CM | POA: Diagnosis not present

## 2014-08-26 DIAGNOSIS — M66812 Spontaneous rupture of other tendons, left shoulder: Secondary | ICD-10-CM | POA: Diagnosis not present

## 2014-08-27 ENCOUNTER — Ambulatory Visit: Payer: Self-pay | Admitting: Internal Medicine

## 2014-08-27 DIAGNOSIS — S4991XA Unspecified injury of right shoulder and upper arm, initial encounter: Secondary | ICD-10-CM | POA: Diagnosis not present

## 2014-08-27 DIAGNOSIS — M25511 Pain in right shoulder: Secondary | ICD-10-CM | POA: Diagnosis not present

## 2014-08-30 DIAGNOSIS — M47812 Spondylosis without myelopathy or radiculopathy, cervical region: Secondary | ICD-10-CM | POA: Diagnosis not present

## 2014-08-30 DIAGNOSIS — G959 Disease of spinal cord, unspecified: Secondary | ICD-10-CM | POA: Diagnosis not present

## 2014-08-30 DIAGNOSIS — M7581 Other shoulder lesions, right shoulder: Secondary | ICD-10-CM | POA: Diagnosis not present

## 2014-09-22 ENCOUNTER — Emergency Department: Payer: Self-pay | Admitting: Emergency Medicine

## 2014-10-01 ENCOUNTER — Ambulatory Visit: Payer: Self-pay | Admitting: Internal Medicine

## 2014-10-01 DIAGNOSIS — M25811 Other specified joint disorders, right shoulder: Secondary | ICD-10-CM | POA: Diagnosis not present

## 2014-10-01 DIAGNOSIS — M79601 Pain in right arm: Secondary | ICD-10-CM | POA: Diagnosis not present

## 2014-10-01 DIAGNOSIS — M7550 Bursitis of unspecified shoulder: Secondary | ICD-10-CM | POA: Diagnosis not present

## 2014-10-01 DIAGNOSIS — M5022 Other cervical disc displacement, mid-cervical region: Secondary | ICD-10-CM | POA: Diagnosis not present

## 2014-10-01 DIAGNOSIS — M5382 Other specified dorsopathies, cervical region: Secondary | ICD-10-CM | POA: Diagnosis not present

## 2014-10-01 DIAGNOSIS — M5020 Other cervical disc displacement, unspecified cervical region: Secondary | ICD-10-CM | POA: Diagnosis not present

## 2014-10-01 DIAGNOSIS — M5021 Other cervical disc displacement,  high cervical region: Secondary | ICD-10-CM | POA: Diagnosis not present

## 2014-10-18 DIAGNOSIS — M5136 Other intervertebral disc degeneration, lumbar region: Secondary | ICD-10-CM | POA: Diagnosis not present

## 2014-10-18 DIAGNOSIS — M549 Dorsalgia, unspecified: Secondary | ICD-10-CM | POA: Diagnosis not present

## 2014-10-22 ENCOUNTER — Emergency Department
Admit: 2014-10-22 | Disposition: A | Discharge status: Other Acute Inpatient Hospital | Payer: Self-pay | Admitting: Student

## 2014-10-22 DIAGNOSIS — R509 Fever, unspecified: Secondary | ICD-10-CM | POA: Diagnosis not present

## 2014-10-22 DIAGNOSIS — N939 Abnormal uterine and vaginal bleeding, unspecified: Secondary | ICD-10-CM | POA: Diagnosis not present

## 2014-10-22 DIAGNOSIS — A419 Sepsis, unspecified organism: Secondary | ICD-10-CM | POA: Diagnosis not present

## 2014-10-22 DIAGNOSIS — D72829 Elevated white blood cell count, unspecified: Secondary | ICD-10-CM | POA: Diagnosis not present

## 2014-10-22 DIAGNOSIS — N7689 Other specified inflammation of vagina and vulva: Secondary | ICD-10-CM | POA: Diagnosis not present

## 2014-10-22 DIAGNOSIS — Z720 Tobacco use: Secondary | ICD-10-CM | POA: Diagnosis not present

## 2014-10-22 DIAGNOSIS — L732 Hidradenitis suppurativa: Secondary | ICD-10-CM | POA: Diagnosis not present

## 2014-10-22 DIAGNOSIS — N739 Female pelvic inflammatory disease, unspecified: Secondary | ICD-10-CM | POA: Diagnosis not present

## 2014-10-22 DIAGNOSIS — Z3202 Encounter for pregnancy test, result negative: Secondary | ICD-10-CM | POA: Diagnosis not present

## 2014-10-22 DIAGNOSIS — R1030 Lower abdominal pain, unspecified: Secondary | ICD-10-CM | POA: Diagnosis not present

## 2014-10-22 DIAGNOSIS — R591 Generalized enlarged lymph nodes: Secondary | ICD-10-CM | POA: Diagnosis not present

## 2014-10-22 DIAGNOSIS — J9811 Atelectasis: Secondary | ICD-10-CM | POA: Diagnosis not present

## 2014-10-22 DIAGNOSIS — R319 Hematuria, unspecified: Secondary | ICD-10-CM | POA: Diagnosis not present

## 2014-10-22 DIAGNOSIS — R109 Unspecified abdominal pain: Secondary | ICD-10-CM | POA: Diagnosis not present

## 2014-10-22 DIAGNOSIS — R1084 Generalized abdominal pain: Secondary | ICD-10-CM | POA: Diagnosis not present

## 2014-10-22 DIAGNOSIS — N493 Fournier gangrene: Secondary | ICD-10-CM | POA: Diagnosis not present

## 2014-10-22 DIAGNOSIS — L089 Local infection of the skin and subcutaneous tissue, unspecified: Secondary | ICD-10-CM | POA: Diagnosis not present

## 2014-10-22 LAB — COMPREHENSIVE METABOLIC PANEL
ALBUMIN: 3.7 g/dL
ANION GAP: 8 (ref 7–16)
AST: 21 U/L
Alkaline Phosphatase: 86 U/L
BUN: 12 mg/dL
Bilirubin,Total: 0.7 mg/dL
CHLORIDE: 106 mmol/L
Calcium, Total: 8.6 mg/dL — ABNORMAL LOW
Co2: 22 mmol/L
Creatinine: 0.8 mg/dL
EGFR (Non-African Amer.): >60
Glucose: 106 mg/dL — ABNORMAL HIGH
Potassium: 3.6 mmol/L
SGPT (ALT): 11 U/L — ABNORMAL LOW
SODIUM: 136 mmol/L
TOTAL PROTEIN: 7.6 g/dL

## 2014-10-22 LAB — URINALYSIS, COMPLETE
BILIRUBIN, UR: NEGATIVE
Glucose,UR: NEGATIVE mg/dL (ref 0–75)
Ketone: NEGATIVE
NITRITE: NEGATIVE
PROTEIN: NEGATIVE
Ph: 8 (ref 4.5–8.0)
Specific Gravity: 1.018 (ref 1.003–1.030)

## 2014-10-22 LAB — CBC
HCT: 35.7 % (ref 35.0–47.0)
HGB: 11.9 g/dL — AB (ref 12.0–16.0)
MCH: 32.3 pg (ref 26.0–34.0)
MCHC: 33.4 g/dL (ref 32.0–36.0)
MCV: 97 fL (ref 80–100)
Platelet: 199 10*3/uL (ref 150–440)
RBC: 3.7 10*6/uL — AB (ref 3.80–5.20)
RDW: 12.5 % (ref 11.5–14.5)
WBC: 16.5 10*3/uL — ABNORMAL HIGH (ref 3.6–11.0)

## 2014-10-22 LAB — WET PREP, GENITAL

## 2014-10-22 LAB — LIPASE, BLOOD: Lipase: 25 U/L

## 2014-10-22 LAB — LACTIC ACID, PLASMA: Lactic Acid, Venous: 0.6 mmol/L

## 2014-10-23 DIAGNOSIS — R591 Generalized enlarged lymph nodes: Secondary | ICD-10-CM | POA: Diagnosis not present

## 2014-10-23 DIAGNOSIS — K668 Other specified disorders of peritoneum: Secondary | ICD-10-CM | POA: Diagnosis not present

## 2014-10-23 DIAGNOSIS — Z886 Allergy status to analgesic agent status: Secondary | ICD-10-CM | POA: Diagnosis not present

## 2014-10-23 DIAGNOSIS — L732 Hidradenitis suppurativa: Secondary | ICD-10-CM | POA: Diagnosis not present

## 2014-10-23 DIAGNOSIS — R509 Fever, unspecified: Secondary | ICD-10-CM | POA: Diagnosis not present

## 2014-10-23 DIAGNOSIS — I96 Gangrene, not elsewhere classified: Secondary | ICD-10-CM | POA: Diagnosis not present

## 2014-10-23 DIAGNOSIS — N764 Abscess of vulva: Secondary | ICD-10-CM | POA: Diagnosis present

## 2014-10-23 DIAGNOSIS — A549 Gonococcal infection, unspecified: Secondary | ICD-10-CM | POA: Diagnosis not present

## 2014-10-23 DIAGNOSIS — N7689 Other specified inflammation of vagina and vulva: Secondary | ICD-10-CM | POA: Diagnosis not present

## 2014-10-23 DIAGNOSIS — Z3202 Encounter for pregnancy test, result negative: Secondary | ICD-10-CM | POA: Diagnosis not present

## 2014-10-23 DIAGNOSIS — R1084 Generalized abdominal pain: Secondary | ICD-10-CM | POA: Diagnosis not present

## 2014-10-23 DIAGNOSIS — A419 Sepsis, unspecified organism: Secondary | ICD-10-CM | POA: Diagnosis not present

## 2014-10-23 DIAGNOSIS — R1030 Lower abdominal pain, unspecified: Secondary | ICD-10-CM | POA: Diagnosis not present

## 2014-10-23 DIAGNOSIS — Z720 Tobacco use: Secondary | ICD-10-CM | POA: Diagnosis not present

## 2014-10-23 DIAGNOSIS — D72829 Elevated white blood cell count, unspecified: Secondary | ICD-10-CM | POA: Diagnosis not present

## 2014-10-23 DIAGNOSIS — N939 Abnormal uterine and vaginal bleeding, unspecified: Secondary | ICD-10-CM | POA: Diagnosis not present

## 2014-10-23 DIAGNOSIS — R102 Pelvic and perineal pain: Secondary | ICD-10-CM | POA: Diagnosis not present

## 2014-10-23 DIAGNOSIS — J45909 Unspecified asthma, uncomplicated: Secondary | ICD-10-CM | POA: Diagnosis present

## 2014-10-23 DIAGNOSIS — Z5320 Procedure and treatment not carried out because of patient's decision for unspecified reasons: Secondary | ICD-10-CM | POA: Diagnosis present

## 2014-10-23 DIAGNOSIS — J9811 Atelectasis: Secondary | ICD-10-CM | POA: Diagnosis not present

## 2014-10-23 DIAGNOSIS — M726 Necrotizing fasciitis: Secondary | ICD-10-CM | POA: Diagnosis not present

## 2014-10-23 DIAGNOSIS — L089 Local infection of the skin and subcutaneous tissue, unspecified: Secondary | ICD-10-CM | POA: Diagnosis not present

## 2014-10-23 LAB — DRUG SCREEN, URINE
Amphetamines, Ur Screen: NEGATIVE
Barbiturates, Ur Screen: NEGATIVE
Benzodiazepine, Ur Scrn: NEGATIVE
Cannabinoid 50 Ng, Ur ~~LOC~~: NEGATIVE
Cocaine Metabolite,Ur ~~LOC~~: NEGATIVE
MDMA (ECSTASY) UR SCREEN: NEGATIVE
METHADONE, UR SCREEN: NEGATIVE
Opiate, Ur Screen: NEGATIVE
Phencyclidine (PCP) Ur S: NEGATIVE
TRICYCLIC, UR SCREEN: NEGATIVE

## 2014-10-23 LAB — GC/CHLAMYDIA PROBE AMP

## 2014-10-23 LAB — HCG, QUANTITATIVE, PREGNANCY: Beta Hcg, Quant.: 2 m[IU]/mL

## 2014-10-23 LAB — TROPONIN I: Troponin-I: 0.03 ng/mL

## 2014-10-26 NOTE — Op Note (Signed)
PATIENT NAME:  Melissa [name], Melissa [name] MR#:  [id number] DATE OF BIRTH:  25-Feb-1976  DATE OF PROCEDURE:  06/18/2014  PREOPERATIVE DIAGNOSIS: Hidradenitis in the left pubic and labial region.   POSTOPERATIVE DIAGNOSIS: Hidradenitis in the left pubic and labial region.  OPERATION PERFORMED: Excision of hidradenitis, left pubic and labial region.   SURGEON: Kathreen CosierS. G. Komal Stangelo, M.D.   ANESTHESIA: General.   COMPLICATIONS: None.   ESTIMATED BLOOD LOSS: Approximately 100 mL.  DRAINS: None.   DESCRIPTION OF PROCEDURE: This patient was put to sleep in the supine position on the operating room table and thereafter placed in adjustable stirrups with adequate exposure of the left side of the perineum and pubic region. The area was prepped with Betadine and draped out. Timeout was performed. The active area of hidradenitis was located in the left side of the pubis extending onto the left labial fold and even further posteriorly as another extension. The entire area measured approximately 12 cm long and as much as 5 cm wide in maximal width. There were some other areas of healed hidradenitis on both sides. These were left alone since the most active process appeared to be in the region on the left side. This area was mapped out. [name] skin incision was made along this area encircling the involved portion of the skin and using cautery it was then completely excised out from the deep and subcutaneous tissue in some places extending to [name] depth of about 2 cm. Bleeding was controlled with cautery. There was [name] significant amount of vasculature in this area and the dissection was done slowly so that bleeders could be cauterized satisfactorily. The excised tissue was sent in formalin for pathology. The wound was inspected and hemostasis ensured. It was then irrigated with some saline. [name] small portion of the skin involving the left labial region, in the posterior aspect, was repaired with 2-0 Vicryl in the deep tissue and the skin with  subcuticular 4-0 Vicryl. The posterior 1 inch of the incision also closed with 2-0 Vicryl and 3-0 Vicryl in deeper tissue and on the skin with subcuticular 4-0 Vicryl. Following this the rest of the wound was approximated with interrupted 3-0 Vicryl stitches, and the skin closed with interrupted stitches of 4-0 nylon [name] dry sterile dressing was placed with use of padded gauze and mesh underwear. The patient subsequently was returned to the recovery room in stable condition.  ____________________________ S.Wynona LunaG. Rito Lecomte, MD sgs:sb D: 06/18/2014 10:53:57 ET T: 06/18/2014 12:15:09 ET JOB#: [id number]  cc: S.G. Evette CristalSankar, MD, <Dictator> [facility] Wynona LunaG Toluwani Ruder MD ELECTRONICALLY SIGNED 06/21/2014 10:05

## 2014-10-27 LAB — CULTURE, BLOOD (SINGLE)

## 2014-10-28 LAB — SURGICAL PATHOLOGY

## 2014-11-03 NOTE — Consult Note (Signed)
Consulting Service: Emergency Department Consulting Physician: Toney RakesGayle, Eryka MD  Consulting Question: Necrotizing Fasciitis   History of Present Illness: 39 year old female presenting to the emergency room with abdominal pain, subjective fevers, chills, rigors.  The patient started feeling ill approximately 24-hr ago, gradual worsening over that time course prompting her fiance to call EMS.  The patient did not note any lesions prior to onset of symptoms.  She does have history of I&D left labial crual fold/mons on June 18, 2014 by Dr. Evette CristalSankar [facility] for what was believed to be hidradenitis.  She also relays a history of similar biols under her arms.  Some nausea no emesis.  History limited by patient discomfort, also received fentanyl just prior to my arrival.  Review of Systems: 10 point review of sytems negative unless otherwise noted in HPI  Past Medical History:Hidradenitisi suppurativa  Surgical History:I&D mons 06/18/2014  Gyencologic History: Denies history of abnormal paps or STI's  Social History: Denies tobacco, EtOH, or illicit drug use  Family History: non-contirbutory  Allergies: tramadol, tyelnol  Medications: none  Physical Exam:97.7; BP 118/76; HR 78, RR 20, O2sat 100%RA appears in acute distress, writhing on stretcher, slightly diaphoreticnormocephalic, anicterictachycardic, no adventitious heart soundsCTABNABS, soft, non-distended, difussely tender, no crepetence, no visible skin changes Some drainage/exudate noted on left labia, overall there is no signfiicant erythema or induration, she does reprots signficant tenderness over both labia extending onto medial thighs, no crepetence as well.no edema, erythema, pedal pulses 2+  Laboratory:Acid: 0.6WBC 16.5K, H&H 11.9 & 25.7, platelets 199K25normalnegative Scan: CT scan showing inflammatory changes vulva L>R, extending mons, and labial crual folds, most concerning for evidence of air within the  changes  Assessment: 39 year old female with presentation and imaging 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  If this is indeed necroticing fasciitis will likely require mutliple debridements, possible wound vac placement or flaps for reconstructions.  I do not feel that we have the ability to offer the patient a high enough acutiy of care at our facility and she may benefit from gynecology oncology input in her management particular if she requires extensive debridement or resection.  Electronic Signatures: Lorrene ReidStaebler, Dynasti Kerman M (MD)  (Signed on 20-Apr-16 01:05)  Authored  Last Updated: 20-Apr-16 01:05 by Lorrene ReidStaebler, Mima Cranmore M (MD)

## 2014-12-27 DIAGNOSIS — Z6835 Body mass index (BMI) 35.0-35.9, adult: Secondary | ICD-10-CM | POA: Diagnosis not present

## 2014-12-27 DIAGNOSIS — M5136 Other intervertebral disc degeneration, lumbar region: Secondary | ICD-10-CM | POA: Diagnosis not present

## 2015-01-09 ENCOUNTER — Other Ambulatory Visit: Payer: Self-pay | Admitting: Neurosurgery

## 2015-01-09 DIAGNOSIS — M5136 Other intervertebral disc degeneration, lumbar region: Secondary | ICD-10-CM

## 2015-01-14 ENCOUNTER — Ambulatory Visit
Admission: RE | Admit: 2015-01-14 | Discharge: 2015-01-14 | Disposition: A | Discharge status: Home / Self Care | Payer: Medicare Other | Source: Ambulatory Visit | Attending: Neurosurgery | Admitting: Neurosurgery

## 2015-01-14 DIAGNOSIS — M5127 Other intervertebral disc displacement, lumbosacral region: Secondary | ICD-10-CM | POA: Diagnosis not present

## 2015-01-14 DIAGNOSIS — M4806 Spinal stenosis, lumbar region: Secondary | ICD-10-CM | POA: Diagnosis not present

## 2015-01-14 DIAGNOSIS — M5137 Other intervertebral disc degeneration, lumbosacral region: Secondary | ICD-10-CM | POA: Diagnosis not present

## 2015-01-14 DIAGNOSIS — M5136 Other intervertebral disc degeneration, lumbar region: Secondary | ICD-10-CM

## 2015-01-14 DIAGNOSIS — M47817 Spondylosis without myelopathy or radiculopathy, lumbosacral region: Secondary | ICD-10-CM | POA: Diagnosis not present

## 2015-01-14 DIAGNOSIS — M5387 Other specified dorsopathies, lumbosacral region: Secondary | ICD-10-CM | POA: Insufficient documentation

## 2015-01-14 DIAGNOSIS — M9973 Connective tissue and disc stenosis of intervertebral foramina of lumbar region: Secondary | ICD-10-CM | POA: Diagnosis not present

## 2015-01-17 DIAGNOSIS — M5136 Other intervertebral disc degeneration, lumbar region: Secondary | ICD-10-CM | POA: Diagnosis not present

## 2015-01-27 DIAGNOSIS — M5136 Other intervertebral disc degeneration, lumbar region: Secondary | ICD-10-CM | POA: Diagnosis not present

## 2015-01-27 DIAGNOSIS — H65199 Other acute nonsuppurative otitis media, unspecified ear: Secondary | ICD-10-CM | POA: Diagnosis not present

## 2015-01-27 DIAGNOSIS — M549 Dorsalgia, unspecified: Secondary | ICD-10-CM | POA: Diagnosis not present

## 2015-01-31 DIAGNOSIS — M5136 Other intervertebral disc degeneration, lumbar region: Secondary | ICD-10-CM | POA: Diagnosis not present

## 2015-02-27 DIAGNOSIS — F17200 Nicotine dependence, unspecified, uncomplicated: Secondary | ICD-10-CM | POA: Diagnosis not present

## 2015-02-27 DIAGNOSIS — M5136 Other intervertebral disc degeneration, lumbar region: Secondary | ICD-10-CM | POA: Diagnosis not present

## 2015-02-27 DIAGNOSIS — R51 Headache: Secondary | ICD-10-CM | POA: Diagnosis not present

## 2015-03-20 ENCOUNTER — Other Ambulatory Visit (HOSPITAL_COMMUNITY): Payer: Self-pay | Admitting: Neurosurgery

## 2015-03-20 DIAGNOSIS — M5136 Other intervertebral disc degeneration, lumbar region: Secondary | ICD-10-CM | POA: Diagnosis not present

## 2015-03-24 ENCOUNTER — Encounter (HOSPITAL_COMMUNITY): Payer: Self-pay | Admitting: Emergency Medicine

## 2015-03-24 ENCOUNTER — Encounter (HOSPITAL_COMMUNITY)
Admission: RE | Admit: 2015-03-24 | Discharge: 2015-03-24 | Disposition: A | Discharge status: Home / Self Care | Payer: Medicare Other | Source: Ambulatory Visit | Attending: Neurosurgery | Admitting: Neurosurgery

## 2015-03-24 ENCOUNTER — Other Ambulatory Visit (HOSPITAL_COMMUNITY): Payer: Self-pay | Admitting: *Deleted

## 2015-03-24 ENCOUNTER — Encounter (HOSPITAL_COMMUNITY): Payer: Self-pay

## 2015-03-24 HISTORY — DX: Headache: R51

## 2015-03-24 HISTORY — DX: Headache, unspecified: R51.9

## 2015-03-24 HISTORY — DX: Unspecified asthma, uncomplicated: J45.909

## 2015-03-24 HISTORY — DX: Anemia, unspecified: D64.9

## 2015-03-24 HISTORY — DX: Major depressive disorder, single episode, unspecified: F32.9

## 2015-03-24 HISTORY — DX: Cardiac murmur, unspecified: R01.1

## 2015-03-24 HISTORY — DX: Unspecified convulsions: R56.9

## 2015-03-24 HISTORY — DX: Depression, unspecified: F32.A

## 2015-03-24 LAB — CBC WITH DIFFERENTIAL/PLATELET
BASOS PCT: 1 %
Basophils Absolute: 0.1 10*3/uL (ref 0.0–0.1)
Eosinophils Absolute: 0.4 10*3/uL (ref 0.0–0.7)
Eosinophils Relative: 4 %
HEMATOCRIT: 36.6 % (ref 36.0–46.0)
Hemoglobin: 12.4 g/dL (ref 12.0–15.0)
LYMPHS ABS: 3 10*3/uL (ref 0.7–4.0)
LYMPHS PCT: 31 %
MCH: 32 pg (ref 26.0–34.0)
MCHC: 33.9 g/dL (ref 30.0–36.0)
MCV: 94.6 fL (ref 78.0–100.0)
MONO ABS: 0.6 10*3/uL (ref 0.1–1.0)
MONOS PCT: 6 %
NEUTROS ABS: 5.7 10*3/uL (ref 1.7–7.7)
Neutrophils Relative %: 58 %
Platelets: 222 10*3/uL (ref 150–400)
RBC: 3.87 MIL/uL (ref 3.87–5.11)
RDW: 12.2 % (ref 11.5–15.5)
WBC: 9.7 10*3/uL (ref 4.0–10.5)

## 2015-03-24 LAB — TYPE AND SCREEN
ABO/RH(D): A POS
Antibody Screen: NEGATIVE

## 2015-03-24 LAB — SURGICAL PCR SCREEN
MRSA, PCR: NEGATIVE
Staphylococcus aureus: NEGATIVE

## 2015-03-24 LAB — ABO/RH: ABO/RH(D): A POS

## 2015-03-24 LAB — HCG, SERUM, QUALITATIVE: Preg, Serum: NEGATIVE

## 2015-03-24 MED ORDER — CEFAZOLIN SODIUM-DEXTROSE 2-3 GM-% IV SOLR
2.0000 g | INTRAVENOUS | Status: AC
  Administered 2015-03-25: 2 g via INTRAVENOUS
  Filled 2015-03-24: qty 50

## 2015-03-24 MED ORDER — DEXAMETHASONE SODIUM PHOSPHATE 10 MG/ML IJ SOLN
10.0000 mg | INTRAMUSCULAR | Status: AC
  Administered 2015-03-25: 10 mg via INTRAVENOUS
  Filled 2015-03-24: qty 1

## 2015-03-24 NOTE — Progress Notes (Signed)
Anesthesia Chart Review:  Pt is 39 year old female scheduled for L4-5 posterior lumbar fusion on 03/25/2015 with Dr. Jordan Likes.   PMH includes: heart murmur (was told had one after one of her children was born), asthma, anemia. Current smoker. BMI 35.   Preoperative labs reviewed.   EKG 07/26/2014: NSR. Possible Left atrial enlargement. LVH.   H&P in media tab dated 03/26/14 documented no heart murmurs auscultated on CV exam.   If no changes, I anticipate pt can proceed with surgery as scheduled.   Rica Mast, FNP-BC [facility] Short Stay Surgical Center/Anesthesiology Phone: [phone] 03/24/2015 2:53 PM

## 2015-03-24 NOTE — Pre-Procedure Instructions (Signed)
  Melissa [name]  03/24/2015     Your procedure is scheduled on Tuesday, March 25, 2015 at 8:00 AM.   Report to [facility] Entrance "A" Admitting Office at 6:00 AM.   Call this number if you have problems the morning of surgery: [phone]     Remember:  Do not eat food or drink liquids after midnight tonight.  Take these medicines the morning of surgery with A SIP OF WATER: Gabapentin (Neurontin), Hydrocodone OR Tramadol - if needed   Do not wear jewelry, make-up or nail polish.  Do not wear lotions, powders, or perfumes.  You may wear deodorant.  Do not shave 48 hours prior to surgery.    Do not bring valuables to the hospital.  [facility] is not responsible for any belongings or valuables.  Contacts, dentures or bridgework may not be worn into surgery.  Leave your suitcase in the car.  After surgery it may be brought to your room.  For patients admitted to the hospital, discharge time will be determined by your treatment team.  Special instructions:  [place] - Preparing for Surgery  Before surgery, you can play an important role.  Because skin is not sterile, your skin needs to be as free of germs as possible.  You can reduce the number of germs on you skin by washing with CHG (chlorahexidine gluconate) soap before surgery.  CHG is an antiseptic cleaner which kills germs and bonds with the skin to continue killing germs even after washing.  Please DO NOT use if you have an allergy to CHG or antibacterial soaps.  If your skin becomes reddened/irritated stop using the CHG and inform your nurse when you arrive at Short Stay.  Do not shave (including legs and underarms) for at least 48 hours prior to the first CHG shower.  You may shave your face.  Please follow these instructions carefully:   1.  Shower with CHG Soap the night before surgery and the                                morning of Surgery.  2.  If you choose to wash your hair, wash your hair first as  usual with your       normal shampoo.  3.  After you shampoo, rinse your hair and body thoroughly to remove the                      Shampoo.  4.  Use CHG as you would any other liquid soap.  You can apply chg directly       to the skin and wash gently with scrungie or a clean washcloth.  5.  Apply the CHG Soap to your body ONLY FROM THE NECK DOWN.        Do not use on open wounds or open sores.  Avoid contact with your eyes,       ears, mouth and genitals (private parts).  Wash genitals (private parts)       with your normal soap.  6.  Wash thoroughly, paying special attention to the area where your surgery        will be performed.  7.  Thoroughly rinse your body with warm water from the neck down.  8.  DO NOT shower/wash with your normal soap after using and rinsing off       the   CHG Soap.  9.  Pat yourself dry with a clean towel.            10.  Wear clean pajamas.            11.  Place clean sheets on your bed the night of your first shower and do not        sleep with pets.  Day of Surgery  Do not apply any lotions the morning of surgery.  Please wear clean clothes to the hospital.   Please read over the following fact sheets that you were given. Pain Booklet, Coughing and Deep Breathing, Blood Transfusion Information, MRSA Information and Surgical Site Infection Prevention       

## 2015-03-24 NOTE — Progress Notes (Signed)
Pt states she was told after having one of her children she had a heart murmur. She states she has never had any problems with it. She also states that "a nurse" a couple of years ago told her that it looked like she had had a heart attack at sometime by looking at her EKG. She states she was never sent for followup, never had Echo, stress test or heart cath. She denies any chest pain or sob.

## 2015-03-24 NOTE — Pre-Procedure Instructions (Signed)
Melissa [name]  03/24/2015     Your procedure is scheduled on Tuesday, March 25, 2015 at 8:00 AM.   Report to [facility] Entrance "A" Admitting Office at 6:00 AM.   Call this number if you have problems the morning of surgery: [phone]     Remember:  Do not eat food or drink liquids after midnight tonight.  Take these medicines the morning of surgery with A SIP OF WATER: Gabapentin (Neurontin), Hydrocodone OR Tramadol - if needed   Do not wear jewelry, make-up or nail polish.  Do not wear lotions, powders, or perfumes.  You may wear deodorant.  Do not shave 48 hours prior to surgery.    Do not bring valuables to the hospital.  [facility] is not responsible for any belongings or valuables.  Contacts, dentures or bridgework may not be worn into surgery.  Leave your suitcase in the car.  After surgery it may be brought to your room.  For patients admitted to the hospital, discharge time will be determined by your treatment team.  Special instructions:  [place] - Preparing for Surgery  Before surgery, you can play an important role.  Because skin is not sterile, your skin needs to be as free of germs as possible.  You can reduce the number of germs on you skin by washing with CHG (chlorahexidine gluconate) soap before surgery.  CHG is an antiseptic cleaner which kills germs and bonds with the skin to continue killing germs even after washing.  Please DO NOT use if you have an allergy to CHG or antibacterial soaps.  If your skin becomes reddened/irritated stop using the CHG and inform your nurse when you arrive at Short Stay.  Do not shave (including legs and underarms) for at least 48 hours prior to the first CHG shower.  You may shave your face.  Please follow these instructions carefully:   1.  Shower with CHG Soap the night before surgery and the                                morning of Surgery.  2.  If you choose to wash your hair, wash your hair first as  usual with your       normal shampoo.  3.  After you shampoo, rinse your hair and body thoroughly to remove the                      Shampoo.  4.  Use CHG as you would any other liquid soap.  You can apply chg directly       to the skin and wash gently with scrungie or a clean washcloth.  5.  Apply the CHG Soap to your body ONLY FROM THE NECK DOWN.        Do not use on open wounds or open sores.  Avoid contact with your eyes,       ears, mouth and genitals (private parts).  Wash genitals (private parts)       with your normal soap.  6.  Wash thoroughly, paying special attention to the area where your surgery        will be performed.  7.  Thoroughly rinse your body with warm water from the neck down.  8.  DO NOT shower/wash with your normal soap after using and rinsing off       the  CHG Soap.  9.  Pat yourself dry with a clean towel.            10.  Wear clean pajamas.            11.  Place clean sheets on your bed the night of your first shower and do not        sleep with pets.  Day of Surgery  Do not apply any lotions the morning of surgery.  Please wear clean clothes to the hospital.   Please read over the following fact sheets that you were given. Pain Booklet, Coughing and Deep Breathing, Blood Transfusion Information, MRSA Information and Surgical Site Infection Prevention

## 2015-03-24 NOTE — Pre-Procedure Instructions (Signed)
    Melissa [name]  03/24/2015    Your procedure is scheduled on Tuesday, September 20..  Report to [facility] Admitting at [facility].                Your surgery is scheduled for 8:00 A.M.   Call this number if you have problems the morning of [phone]                  For any other questions, please call [phone], Monday - Friday 8 AM - 4 PM.   Remember:  Do not eat food or drink liquids after midnight.  Take these medicines the morning of surgery with A SIP OF WATER gabapentin (NEURONTIN).               Take if needed and if you can tolerate on an empty stomach.   Do not wear jewelry, make-up or nail polish.   Do not wear lotions, powders, or perfumes.  You may wear deodorant.   Do not shave 48 hours prior to surgery.  Men may shave face and neck.   Do not bring valuables to the hospital.   [facility] is not responsible for any belongings or valuables.  Contacts, dentures or bridgework may not be worn into surgery.  Leave your suitcase in the car.  After surgery it may be brought to your room.  For patients admitted to the hospital, discharge time will be determined by your treatment team.   Special instructions:  Review  [place] - Preparing For Surgery.  Please read over the following fact sheets that you were given. Pain Booklet, Coughing and Deep Breathing, Blood Transfusion Information and Surgical Site Infection Prevention

## 2015-03-25 ENCOUNTER — Inpatient Hospital Stay (HOSPITAL_COMMUNITY): Payer: Medicare Other

## 2015-03-25 ENCOUNTER — Inpatient Hospital Stay: Admit: 2015-03-25 | Payer: Self-pay | Admitting: Neurosurgery

## 2015-03-25 ENCOUNTER — Encounter (HOSPITAL_COMMUNITY)
Admission: RE | Disposition: A | Discharge status: Home Health | Payer: Self-pay | Source: Ambulatory Visit | Attending: Neurosurgery

## 2015-03-25 ENCOUNTER — Inpatient Hospital Stay (HOSPITAL_COMMUNITY)
Admission: RE | Admit: 2015-03-25 | Discharge: 2015-03-27 | DRG: 460 | Disposition: A | Discharge status: Home Health | Payer: Medicare Other | Source: Ambulatory Visit | Attending: Neurosurgery | Admitting: Neurosurgery

## 2015-03-25 ENCOUNTER — Encounter (HOSPITAL_COMMUNITY): Payer: Self-pay | Admitting: *Deleted

## 2015-03-25 ENCOUNTER — Inpatient Hospital Stay (HOSPITAL_COMMUNITY): Payer: Medicare Other | Admitting: Certified Registered Nurse Anesthetist

## 2015-03-25 ENCOUNTER — Encounter (HOSPITAL_COMMUNITY)
Admission: RE | Disposition: A | Discharge status: Home Health | Payer: Medicare Other | Source: Ambulatory Visit | Attending: Neurosurgery

## 2015-03-25 DIAGNOSIS — F17210 Nicotine dependence, cigarettes, uncomplicated: Secondary | ICD-10-CM | POA: Diagnosis present

## 2015-03-25 DIAGNOSIS — M51369 Other intervertebral disc degeneration, lumbar region without mention of lumbar back pain or lower extremity pain: Secondary | ICD-10-CM | POA: Diagnosis present

## 2015-03-25 DIAGNOSIS — Z6834 Body mass index (BMI) 34.0-34.9, adult: Secondary | ICD-10-CM

## 2015-03-25 DIAGNOSIS — M4806 Spinal stenosis, lumbar region: Secondary | ICD-10-CM | POA: Diagnosis present

## 2015-03-25 DIAGNOSIS — M5136 Other intervertebral disc degeneration, lumbar region: Secondary | ICD-10-CM | POA: Diagnosis present

## 2015-03-25 DIAGNOSIS — Z888 Allergy status to other drugs, medicaments and biological substances status: Secondary | ICD-10-CM | POA: Diagnosis not present

## 2015-03-25 DIAGNOSIS — Z01812 Encounter for preprocedural laboratory examination: Secondary | ICD-10-CM | POA: Diagnosis not present

## 2015-03-25 DIAGNOSIS — D649 Anemia, unspecified: Secondary | ICD-10-CM | POA: Diagnosis not present

## 2015-03-25 DIAGNOSIS — Z886 Allergy status to analgesic agent status: Secondary | ICD-10-CM

## 2015-03-25 DIAGNOSIS — M4326 Fusion of spine, lumbar region: Secondary | ICD-10-CM | POA: Diagnosis not present

## 2015-03-25 DIAGNOSIS — Z419 Encounter for procedure for purposes other than remedying health state, unspecified: Secondary | ICD-10-CM

## 2015-03-25 HISTORY — PX: POSTERIOR LUMBAR FUSION: SHX6036

## 2015-03-25 SURGERY — POSTERIOR LUMBAR FUSION
Anesthesia: General

## 2015-03-25 SURGERY — POSTERIOR LUMBAR FUSION 1 LEVEL
Anesthesia: General | Site: Back

## 2015-03-25 MED ORDER — MIDAZOLAM HCL 2 MG/2ML IJ SOLN
INTRAMUSCULAR | Status: AC
  Administered 2015-03-25: 1 mg
  Filled 2015-03-25: qty 2

## 2015-03-25 MED ORDER — VANCOMYCIN HCL 1000 MG IV SOLR
INTRAVENOUS | Status: AC
  Filled 2015-03-25: qty 1000

## 2015-03-25 MED ORDER — ACETAMINOPHEN 650 MG RE SUPP: 650.0000 mg | RECTAL | Status: DC | PRN

## 2015-03-25 MED ORDER — BUPIVACAINE HCL (PF) 0.25 % IJ SOLN
INTRAMUSCULAR | Status: DC | PRN
Start: 2015-03-25 — End: 2015-03-25
  Administered 2015-03-25: 20 mL

## 2015-03-25 MED ORDER — CEFAZOLIN SODIUM 1-5 GM-% IV SOLN
1.0000 g | Freq: Three times a day (TID) | INTRAVENOUS | Status: AC
  Administered 2015-03-25 – 2015-03-26 (×2): 1 g via INTRAVENOUS
  Filled 2015-03-25 (×2): qty 50

## 2015-03-25 MED ORDER — ROCURONIUM BROMIDE 100 MG/10ML IV SOLN
INTRAVENOUS | Status: DC | PRN
  Administered 2015-03-25: 50 mg via INTRAVENOUS

## 2015-03-25 MED ORDER — PHENOL 1.4 % MT LIQD: 1.0000 | OROMUCOSAL | Status: DC | PRN

## 2015-03-25 MED ORDER — LACTATED RINGERS IV SOLN
INTRAVENOUS | Status: DC | PRN
  Administered 2015-03-25 (×2): via INTRAVENOUS

## 2015-03-25 MED ORDER — THROMBIN 20000 UNITS EX SOLR
CUTANEOUS | Status: DC | PRN
  Administered 2015-03-25: 11:00:00 via TOPICAL

## 2015-03-25 MED ORDER — FENTANYL CITRATE (PF) 100 MCG/2ML IJ SOLN
INTRAMUSCULAR | Status: AC
  Administered 2015-03-25: 50 ug
  Filled 2015-03-25: qty 2

## 2015-03-25 MED ORDER — DIAZEPAM 5 MG PO TABS
5.0000 mg | ORAL_TABLET | Freq: Four times a day (QID) | ORAL | Status: DC | PRN
  Administered 2015-03-25 – 2015-03-26 (×5): 5 mg via ORAL
  Filled 2015-03-25 (×4): qty 1

## 2015-03-25 MED ORDER — GABAPENTIN 100 MG PO CAPS
100.0000 mg | ORAL_CAPSULE | Freq: Three times a day (TID) | ORAL | Status: DC
  Administered 2015-03-25 – 2015-03-26 (×5): 100 mg via ORAL
  Filled 2015-03-25 (×5): qty 1

## 2015-03-25 MED ORDER — LIDOCAINE HCL (CARDIAC) 20 MG/ML IV SOLN
INTRAVENOUS | Status: AC
  Filled 2015-03-25: qty 5

## 2015-03-25 MED ORDER — PROMETHAZINE HCL 25 MG/ML IJ SOLN
6.2500 mg | INTRAMUSCULAR | Status: DC | PRN
  Administered 2015-03-25: 6.25 mg via INTRAVENOUS

## 2015-03-25 MED ORDER — HYDROCODONE-ACETAMINOPHEN 5-325 MG PO TABS: 1.0000 | ORAL_TABLET | ORAL | Status: DC | PRN

## 2015-03-25 MED ORDER — MIDAZOLAM HCL 2 MG/2ML IJ SOLN
INTRAMUSCULAR | Status: AC
  Filled 2015-03-25: qty 4

## 2015-03-25 MED ORDER — HYDROMORPHONE HCL 1 MG/ML IJ SOLN
INTRAMUSCULAR | Status: AC
  Filled 2015-03-25: qty 1

## 2015-03-25 MED ORDER — PROPOFOL 10 MG/ML IV BOLUS
INTRAVENOUS | Status: AC
  Filled 2015-03-25: qty 20

## 2015-03-25 MED ORDER — PROPOFOL 10 MG/ML IV BOLUS
INTRAVENOUS | Status: DC | PRN
  Administered 2015-03-25: 150 mg via INTRAVENOUS

## 2015-03-25 MED ORDER — MENTHOL 3 MG MT LOZG: 1.0000 | LOZENGE | OROMUCOSAL | Status: DC | PRN

## 2015-03-25 MED ORDER — MIDAZOLAM HCL 5 MG/5ML IJ SOLN
INTRAMUSCULAR | Status: DC | PRN
  Administered 2015-03-25: 2 mg via INTRAVENOUS

## 2015-03-25 MED ORDER — FENTANYL CITRATE (PF) 250 MCG/5ML IJ SOLN
INTRAMUSCULAR | Status: AC
  Filled 2015-03-25: qty 5

## 2015-03-25 MED ORDER — FENTANYL CITRATE (PF) 100 MCG/2ML IJ SOLN
INTRAMUSCULAR | Status: DC | PRN
  Administered 2015-03-25: 200 ug via INTRAVENOUS
  Administered 2015-03-25 (×4): 50 ug via INTRAVENOUS

## 2015-03-25 MED ORDER — OXYCODONE-ACETAMINOPHEN 5-325 MG PO TABS
ORAL_TABLET | ORAL | Status: AC
  Filled 2015-03-25: qty 2

## 2015-03-25 MED ORDER — GLYCOPYRROLATE 0.2 MG/ML IJ SOLN
INTRAMUSCULAR | Status: DC | PRN
  Administered 2015-03-25: .8 mg via INTRAVENOUS

## 2015-03-25 MED ORDER — LIDOCAINE HCL 4 % MT SOLN
OROMUCOSAL | Status: DC | PRN
  Administered 2015-03-25: 4 mL via TOPICAL

## 2015-03-25 MED ORDER — ROCURONIUM BROMIDE 50 MG/5ML IV SOLN
INTRAVENOUS | Status: AC
  Filled 2015-03-25: qty 1

## 2015-03-25 MED ORDER — HYDROMORPHONE HCL 1 MG/ML IJ SOLN
0.2500 mg | INTRAMUSCULAR | Status: DC | PRN
  Administered 2015-03-25 (×4): 0.5 mg via INTRAVENOUS

## 2015-03-25 MED ORDER — 0.9 % SODIUM CHLORIDE (POUR BTL) OPTIME
TOPICAL | Status: DC | PRN
  Administered 2015-03-25: 1000 mL

## 2015-03-25 MED ORDER — DIAZEPAM 5 MG PO TABS
ORAL_TABLET | ORAL | Status: AC
Start: 2015-03-25 — End: 2015-03-26
  Filled 2015-03-25: qty 1

## 2015-03-25 MED ORDER — PROMETHAZINE HCL 25 MG/ML IJ SOLN
INTRAMUSCULAR | Status: AC
  Filled 2015-03-25: qty 1

## 2015-03-25 MED ORDER — ACETAMINOPHEN 325 MG PO TABS: 650.0000 mg | ORAL_TABLET | ORAL | Status: DC | PRN

## 2015-03-25 MED ORDER — OXYCODONE-ACETAMINOPHEN 5-325 MG PO TABS
1.0000 | ORAL_TABLET | ORAL | Status: DC | PRN
  Administered 2015-03-25 – 2015-03-27 (×10): 2 via ORAL
  Filled 2015-03-25 (×9): qty 2

## 2015-03-25 MED ORDER — MEPERIDINE HCL 25 MG/ML IJ SOLN: 6.2500 mg | INTRAMUSCULAR | Status: DC | PRN

## 2015-03-25 MED ORDER — HYDROMORPHONE HCL 1 MG/ML IJ SOLN
0.5000 mg | INTRAMUSCULAR | Status: DC | PRN
  Administered 2015-03-25 – 2015-03-26 (×3): 1 mg via INTRAVENOUS
  Filled 2015-03-25 (×3): qty 1

## 2015-03-25 MED ORDER — ONDANSETRON HCL 4 MG/2ML IJ SOLN
INTRAMUSCULAR | Status: DC | PRN
  Administered 2015-03-25: 4 mg via INTRAVENOUS

## 2015-03-25 MED ORDER — SODIUM CHLORIDE 0.9 % IJ SOLN: 3.0000 mL | INTRAMUSCULAR | Status: DC | PRN

## 2015-03-25 MED ORDER — SODIUM CHLORIDE 0.9 % IR SOLN
Status: DC | PRN
  Administered 2015-03-25: 11:00:00

## 2015-03-25 MED ORDER — ONDANSETRON HCL 4 MG/2ML IJ SOLN
INTRAMUSCULAR | Status: AC
  Filled 2015-03-25: qty 2

## 2015-03-25 MED ORDER — SODIUM CHLORIDE 0.9 % IJ SOLN
3.0000 mL | Freq: Two times a day (BID) | INTRAMUSCULAR | Status: DC
  Administered 2015-03-26 (×2): 3 mL via INTRAVENOUS

## 2015-03-25 MED ORDER — FENTANYL CITRATE (PF) 100 MCG/2ML IJ SOLN
50.0000 ug | Freq: Once | INTRAMUSCULAR | Status: DC
  Filled 2015-03-25: qty 1

## 2015-03-25 MED ORDER — MIDAZOLAM HCL 2 MG/2ML IJ SOLN
0.5000 mg | Freq: Once | INTRAMUSCULAR | Status: AC | PRN
  Administered 2015-03-25: 1 mg via INTRAVENOUS

## 2015-03-25 MED ORDER — VANCOMYCIN HCL 1000 MG IV SOLR
INTRAVENOUS | Status: DC | PRN
  Administered 2015-03-25: 1000 mg

## 2015-03-25 MED ORDER — LACTATED RINGERS IV SOLN: INTRAVENOUS | Status: DC | PRN

## 2015-03-25 MED ORDER — NEOSTIGMINE METHYLSULFATE 10 MG/10ML IV SOLN
INTRAVENOUS | Status: DC | PRN
  Administered 2015-03-25: 5 mg via INTRAVENOUS

## 2015-03-25 MED ORDER — LIDOCAINE HCL (CARDIAC) 20 MG/ML IV SOLN
INTRAVENOUS | Status: DC | PRN
  Administered 2015-03-25: 30 mg via INTRAVENOUS

## 2015-03-25 MED ORDER — ONDANSETRON HCL 4 MG/2ML IJ SOLN: 4.0000 mg | INTRAMUSCULAR | Status: DC | PRN

## 2015-03-25 SURGICAL SUPPLY — 63 items
BAG DECANTER FOR FLEXI CONT (MISCELLANEOUS) ×2 IMPLANT
BENZOIN TINCTURE PRP APPL 2/3 (GAUZE/BANDAGES/DRESSINGS) ×2 IMPLANT
BLADE CLIPPER SURG (BLADE) IMPLANT
BRUSH SCRUB EZ PLAIN DRY (MISCELLANEOUS) ×2 IMPLANT
BUR MATCHSTICK NEURO 3.0 LAGG (BURR) ×2 IMPLANT
CAGE CAPSTONE 10X22X6 (Cage) ×4 IMPLANT
CANISTER SUCT 3000ML PPV (MISCELLANEOUS) ×2 IMPLANT
CAP LCK SPNE (Orthopedic Implant) ×4 IMPLANT
CAP LOCK SPINE RADIUS (Orthopedic Implant) ×4 IMPLANT
CAP LOCKING (Orthopedic Implant) ×4 IMPLANT
CONT SPEC 4OZ CLIKSEAL STRL BL (MISCELLANEOUS) ×4 IMPLANT
COVER BACK TABLE 60X90IN (DRAPES) ×2 IMPLANT
COVER SURGICAL LIGHT HANDLE (MISCELLANEOUS) ×4 IMPLANT
DECANTER SPIKE VIAL GLASS SM (MISCELLANEOUS) ×2 IMPLANT
DERMABOND ADVANCED (GAUZE/BANDAGES/DRESSINGS) ×1
DERMABOND ADVANCED .7 DNX12 (GAUZE/BANDAGES/DRESSINGS) ×1 IMPLANT
DRAPE C-ARM 42X72 X-RAY (DRAPES) ×4 IMPLANT
DRAPE LAPAROTOMY 100X72X124 (DRAPES) ×2 IMPLANT
DRAPE POUCH INSTRU U-SHP 10X18 (DRAPES) ×2 IMPLANT
DRAPE PROXIMA HALF (DRAPES) IMPLANT
DRAPE SURG 17X23 STRL (DRAPES) ×8 IMPLANT
DRSG OPSITE POSTOP 4X6 (GAUZE/BANDAGES/DRESSINGS) ×2 IMPLANT
DURAPREP 26ML APPLICATOR (WOUND CARE) ×2 IMPLANT
ELECT REM PT RETURN 9FT ADLT (ELECTROSURGICAL) ×2
ELECTRODE REM PT RTRN 9FT ADLT (ELECTROSURGICAL) ×1 IMPLANT
EVACUATOR 1/8 PVC DRAIN (DRAIN) ×2 IMPLANT
GAUZE SPONGE 4X4 12PLY STRL (GAUZE/BANDAGES/DRESSINGS) ×2 IMPLANT
GAUZE SPONGE 4X4 16PLY XRAY LF (GAUZE/BANDAGES/DRESSINGS) IMPLANT
GLOVE BIOGEL PI IND STRL 7.0 (GLOVE) ×1 IMPLANT
GLOVE BIOGEL PI IND STRL 7.5 (GLOVE) ×1 IMPLANT
GLOVE BIOGEL PI INDICATOR 7.0 (GLOVE) ×1
GLOVE BIOGEL PI INDICATOR 7.5 (GLOVE) ×1
GLOVE ECLIPSE 9.0 STRL (GLOVE) ×4 IMPLANT
GLOVE EXAM NITRILE LRG STRL (GLOVE) IMPLANT
GLOVE EXAM NITRILE MD LF STRL (GLOVE) IMPLANT
GLOVE EXAM NITRILE XL STR (GLOVE) IMPLANT
GLOVE EXAM NITRILE XS STR PU (GLOVE) IMPLANT
GOWN STRL REUS W/ TWL LRG LVL3 (GOWN DISPOSABLE) IMPLANT
GOWN STRL REUS W/ TWL XL LVL3 (GOWN DISPOSABLE) ×3 IMPLANT
GOWN STRL REUS W/TWL 2XL LVL3 (GOWN DISPOSABLE) IMPLANT
GOWN STRL REUS W/TWL LRG LVL3 (GOWN DISPOSABLE)
GOWN STRL REUS W/TWL XL LVL3 (GOWN DISPOSABLE) ×3
KIT BASIN OR (CUSTOM PROCEDURE TRAY) ×2 IMPLANT
KIT ROOM TURNOVER OR (KITS) ×2 IMPLANT
LIQUID BAND (GAUZE/BANDAGES/DRESSINGS) ×2 IMPLANT
NEEDLE HYPO 22GX1.5 SAFETY (NEEDLE) ×2 IMPLANT
NS IRRIG 1000ML POUR BTL (IV SOLUTION) ×2 IMPLANT
PACK LAMINECTOMY NEURO (CUSTOM PROCEDURE TRAY) ×2 IMPLANT
ROD RADIUS 40MM (Neuro Prosthesis/Implant) ×2 IMPLANT
ROD SPNL 40X5.5XNS TI RDS (Neuro Prosthesis/Implant) ×2 IMPLANT
SCREW 5.75X40M (Screw) ×4 IMPLANT
SCREW 5.75X45MM (Screw) ×14 IMPLANT
SPONGE SURGIFOAM ABS GEL 100 (HEMOSTASIS) ×2 IMPLANT
STRIP CLOSURE SKIN 1/2X4 (GAUZE/BANDAGES/DRESSINGS) ×2 IMPLANT
SUT VIC AB 0 CT1 18XCR BRD8 (SUTURE) ×2 IMPLANT
SUT VIC AB 0 CT1 8-18 (SUTURE) ×2
SUT VIC AB 2-0 CT1 18 (SUTURE) ×2 IMPLANT
SUT VIC AB 3-0 SH 8-18 (SUTURE) ×4 IMPLANT
SYR 20ML ECCENTRIC (SYRINGE) ×2 IMPLANT
TOWEL OR 17X24 6PK STRL BLUE (TOWEL DISPOSABLE) ×2 IMPLANT
TOWEL OR 17X26 10 PK STRL BLUE (TOWEL DISPOSABLE) ×2 IMPLANT
TRAY FOLEY CATH 14FRSI W/METER (CATHETERS) IMPLANT
WATER STERILE IRR 1000ML POUR (IV SOLUTION) ×2 IMPLANT

## 2015-03-25 NOTE — Transfer of Care (Signed)
Immediate Anesthesia Transfer of Care Note  Patient: Melissa [name]  Procedure(s) Performed: Procedure(s): POSTERIOR LUMBAR INTERBODY FUSION LUMBAR FOUR-FIVE (N/A)  Patient Location: PACU  Anesthesia Type:General  Level of Consciousness: awake, alert  and oriented  Airway & Oxygen Therapy: Patient Spontanous Breathing and Patient connected to face mask oxygen  Post-op Assessment: Report given to RN and Post -op Vital signs reviewed and stable  Post vital signs: Reviewed and stable  Last Vitals:  Filed Vitals:   03/25/15 0650  BP: 111/63  Pulse: 66  Temp: 36.3 C  Resp: 18    Complications: No apparent anesthesia complications

## 2015-03-25 NOTE — Progress Notes (Signed)
Utilization review completed.  

## 2015-03-25 NOTE — Anesthesia Procedure Notes (Signed)
Procedure Name: Intubation Date/Time: 03/25/2015 10:54 AM Performed by: Dairl Ponder Pre-anesthesia Checklist: Patient identified, Timeout performed, Emergency Drugs available, Suction available and Patient being monitored Patient Re-evaluated:Patient Re-evaluated prior to inductionOxygen Delivery Method: Circle system utilized Preoxygenation: Pre-oxygenation with 100% oxygen Intubation Type: IV induction Ventilation: Mask ventilation without difficulty Laryngoscope Size: Mac and 3 Grade View: Grade II Tube size: 7.0 mm Number of attempts: 1 Airway Equipment and Method: Stylet Placement Confirmation: ETT inserted through vocal cords under direct vision,  breath sounds checked- equal and bilateral and positive ETCO2 Secured at: 23 cm Tube secured with: Tape Dental Injury: Teeth and Oropharynx as per pre-operative assessment

## 2015-03-25 NOTE — OR Nursing (Signed)
Late entry for delay code documentation. 

## 2015-03-25 NOTE — Plan of Care (Signed)
Problem: Consults Goal: Diagnosis - Spinal Surgery Outcome: Completed/Met Date Met:  03/25/15 Thoraco/Lumbar Spine Fusion

## 2015-03-25 NOTE — Progress Notes (Signed)
Surgery delayed for a little while.Pt c/o back pain rated 10 on 0-10 scale. Spoke with Dr.Germeroth with orders received may give Fentanyl and may repeat if needed.

## 2015-03-25 NOTE — H&P (Signed)
Melissa Melissa [name] is an 39 y.o. female.   Chief Complaint: Back pain HPI: 39 year old female with severe back pain and intermittent bilateral lower extremity numbness paresthesias and radiating pain. Patient has workup demonstrated evidence of extreme disc degeneration at L4-5 with erosive endplate changes. No history of prior trauma. No definite history of prior infection. Patient has failed conservative management. Remainder of her spinal's healthy.  Past Medical History  Diagnosis Date  . Hidradenitis 2007  . Kidney infection   . Heart murmur     after child was born - has never had any problems  . Asthma     as Melissa [name] young adult - not for many years  . Depression   . Kidney stones   . Seizures     as Melissa [name] baby   . Headache     migraines  . Anemia   . Family history of adverse reaction to anesthesia     Past Surgical History  Procedure Laterality Date  . Appendectomy  1997  . Knee surgery Left 1997  . Axillary hidradenitis excision Bilateral 2007  . Inguinal hidradenitis excision  06-18-14    left  . Kidney stone surgery    . Tubal ligation    . Cesarean section      Family History  Problem Relation Age of Onset  . Diabetes Mother   . Hypertension Mother    Social History:  reports that she has been smoking Cigarettes.  She has Melissa [name] 5 pack-year smoking history. She has never used smokeless tobacco. She reports that she drinks about 12.6 oz of alcohol per week. She reports that she does not use illicit drugs.  Allergies:  Allergies  Allergen Reactions  . Tylenol [Acetaminophen] Itching    Throat itch and high fever (no reaction to hydrocodone with acetaminophen)  . Celebrex [Celecoxib] Nausea And Vomiting  . Other Rash    Powder in gloves causes rash    Medications Prior to Admission  Medication Sig Dispense Refill  . gabapentin (NEURONTIN) 100 MG capsule Take 100 mg by mouth 3 (three) times daily.  2  . HYDROcodone-acetaminophen (NORCO/VICODIN) 5-325 MG per tablet Take 1  tablet by mouth every 6 (six) hours as needed (pain).   0  . Menthol, Topical Analgesic, (ICY HOT EX) Apply 1 application topically every 2 (two) hours as needed (pain).    . traMADol (ULTRAM) 50 MG tablet Take 50 mg by mouth 2 (two) times daily as needed (pain).   1    Results for orders placed or performed during the hospital encounter of 03/24/15 (from the past 48 hour(s))  hCG, serum, qualitative     Status: None   Collection Time: 03/24/15  1:47 PM  Result Value Ref Range   Preg, Serum NEGATIVE NEGATIVE    Comment:        THE SENSITIVITY OF THIS METHODOLOGY IS >10 mIU/mL.   Surgical pcr screen     Status: None   Collection Time: 03/24/15  1:47 PM  Result Value Ref Range   MRSA, PCR NEGATIVE NEGATIVE   Staphylococcus aureus NEGATIVE NEGATIVE    Comment:        The Xpert SA Assay (FDA approved for NASAL specimens in patients over 74 years of age), is one component of Melissa [name] comprehensive surveillance program.  Test performance has been validated by [facility] for patients greater than or equal to 13 year old. It is not intended to diagnose infection nor to guide or monitor treatment.  CBC WITH DIFFERENTIAL     Status: None   Collection Time: 03/24/15  1:47 PM  Result Value Ref Range   WBC 9.7 4.0 - 10.5 K/uL   RBC 3.87 3.87 - 5.11 MIL/uL   Hemoglobin 12.4 12.0 - 15.0 g/dL   HCT 40.9 81.1 - 91.4 %   MCV 94.6 78.0 - 100.0 fL   MCH 32.0 26.0 - 34.0 pg   MCHC 33.9 30.0 - 36.0 g/dL   RDW 78.2 95.6 - 21.3 %   Platelets 222 150 - 400 K/uL   Neutrophils Relative % 58 %   Neutro Abs 5.7 1.7 - 7.7 K/uL   Lymphocytes Relative 31 %   Lymphs Abs 3.0 0.7 - 4.0 K/uL   Monocytes Relative 6 %   Monocytes Absolute 0.6 0.1 - 1.0 K/uL   Eosinophils Relative 4 %   Eosinophils Absolute 0.4 0.0 - 0.7 K/uL   Basophils Relative 1 %   Basophils Absolute 0.1 0.0 - 0.1 K/uL  Type and screen     Status: None   Collection Time: 03/24/15  1:50 PM  Result Value Ref Range   ABO/RH(D) Melissa [name] POS     Antibody Screen NEG    Sample Expiration 03/27/2015   ABO/Rh     Status: None   Collection Time: 03/24/15  1:50 PM  Result Value Ref Range   ABO/RH(D) Melissa [name] POS    No results found.  Melissa [name] comprehensive review of systems was negative.  Blood pressure 111/63, pulse 66, temperature 97.4 F (36.3 C), temperature source Oral, resp. rate 18, height  (1.651 m), weight 94.348 kg (208 lb), last menstrual period 03/02/2015, SpO2 100 %.  The patient is awake and alert. She is oriented and appropriate. Cranial nerve function is intact. Speech is fluent. Judgment and insight are intact. Motor and sensory function extremities intact to direct testing. Reflexes normal. Examination head ears eyes and Thursdays are unremarkable. Neck is supple. Carotid pulses normal. Chest clear to auscultation. Heart regular rate and rhythm. Abdomen benign. Assessment/Plan L4-5 degenerative disc disease with intractable back pain and some foraminal stenosis. Plan L4-5 decompression and fusion with interbody peek cages, locally harvested autograft, and augmented with posterior lateral arthrodesis utilizing nonsegmental pedicle screw instrumentation in hopes of improving her symptoms. Risks and benefits of been explained. Patient wishes to proceed.Marland Kitchen  Melissa Melissa [name] 03/25/2015, 7:46 AM

## 2015-03-25 NOTE — Op Note (Signed)
Date of procedure: 03/25/2015  Date of dictation: Same  Service: Neurosurgery  Preoperative diagnosis: L4/5 DDD/Stenosis  Postoperative diagnosis: Same  Procedure Name: Bilateral L4-L5 decompressive laminotomies with foraminotomies, more than would be required for simple interbody fusion alone.  L4-5 posterior lumbar interbody fusion utilizing interbody peek cages and locally harvested autograft  L4-5 posterior lateral arthrodesis utilizing nonsegmental pedicle screw instrumentation and local autograft  Surgeon:Henry A.Pool, M.D.  Asst. Surgeon: Conchita Paris  Anesthesia: General  Indication: 39 year old female with severe back and bilateral lower extremity symptoms failing conservative management. Workup demonstrates evidence of marked disc degeneration with severe endplate changes at L4-5 with resultant foraminal stenosis. Patient has failed conservative management presents now for decompression and fusion in hopes of improving her symptoms.  Operative note: After induction of anesthesia, patient position prone onto Wilson frame and a properly padded. Lumbar region prepped and draped. Incision made overlying L4-5. Dissection performed. Retractor placed. Fluoroscopy used. Levels confirmed. Bilateral decompressive laminotomies and foraminotomies involving the L4 and L5 nerve roots were performed. Bone was collected for later use and autografting. Ligament flavum elevated and resected. Foraminotomies completed on course exiting nerve roots. Bilateral discectomies performed at L4-5. Disc space disc was distracted and a 10 mm distractor left the patient's left side. Thecal sac and nerve roots protected on the right side. Disc space prepared for interbody fusion. A 10 mm x 22 mm x 6 [employer] cage packed with locally harvested autograft was impacted into place and rotated into final position. Distractor removed patient's left side. Thecal sac and nerve respect on the left side. Disc space once again  prepared for interbody fusion. Morselized autograft packed into the interspace. A second cage packed with autograft was then impacted into place and her's and rotated into final position. Pedicles of L4 and L5 were notified using surface landmarks and intraoperative fluoroscopy. Pilot holes drilled. Each pilot hole was probed using pedicle awl each. Each pedicle awl track was probed and found to be solidly within the bone. Each pedicle awl track was then tapped with a screw tap and then 5.75 radius brand screws from [employer] were placed bilaterally at L4 and L5. Transverse processes and residual facets were decorticated. Morselize autograft packed posterior laterally. Short segment titanium rod placed or the screw heads at L4 and L5. Locking caps placed over the screws. Locking caps engaged with the construct under compression. Final images revealed good position of the bone graft and hardware at proper upper level with normal alignment of spine. Wound irrigated one final time. Gelfoam placed topically for hemostasis. Vancomycin powder placed the deep wound space. Wound closed in layers of 5 sutures. Steri-Strips and sterile dressing applied. No apparent complications. Patient tolerated the procedure well and she returns to the recovery room postop.

## 2015-03-25 NOTE — Anesthesia Preprocedure Evaluation (Addendum)
Anesthesia Evaluation  Patient identified by MRN, date of birth, ID band Patient awake    Reviewed: Allergy & Precautions, NPO status , Patient's Chart, lab work & pertinent test results  History of Anesthesia Complications Negative for: history of anesthetic complications  Airway Mallampati: II  TM Distance: >3 FB Neck ROM: Full    Dental  (+) Chipped, Dental Advisory Given   Pulmonary asthma , Current Smoker,    breath sounds clear to auscultation       Cardiovascular negative cardio ROS   Rhythm:Regular Rate:Normal     Neuro/Psych Seizures - (not since childhood),  Anxiety Depression Chronic back pain: narcotics    GI/Hepatic negative GI ROS, Neg liver ROS,   Endo/Other  Morbid obesity  Renal/GU negative Renal ROS     Musculoskeletal   Abdominal (+) + obese,   Peds  Hematology negative hematology ROS (+)   Anesthesia Other Findings   Reproductive/Obstetrics 03/24/15 preg test: neg                          Anesthesia Physical Anesthesia Plan  ASA: II  Anesthesia Plan: General   Post-op Pain Management:    Induction: Intravenous  Airway Management Planned: Oral ETT  Additional Equipment:   Intra-op Plan:   Post-operative Plan: Extubation in OR  Informed Consent: I have reviewed the patients History and Physical, chart, labs and discussed the procedure including the risks, benefits and alternatives for the proposed anesthesia with the patient or authorized representative who has indicated his/her understanding and acceptance.   Dental advisory given  Plan Discussed with: CRNA and Surgeon  Anesthesia Plan Comments: (Plan routine monitors, GETA)        Anesthesia Quick Evaluation

## 2015-03-25 NOTE — Brief Op Note (Signed)
03/25/2015  12:40 PM  PATIENT:  Melissa [name]  39 y.o. female  PRE-OPERATIVE DIAGNOSIS:  Spinal stenosis  POST-OPERATIVE DIAGNOSIS:  Spinal stenosis  PROCEDURE:  Procedure(s): POSTERIOR LUMBAR INTERBODY FUSION LUMBAR FOUR-FIVE (N/A)  SURGEON:  Surgeon(s) and Role:    * Julio Sicks, MD - Primary    * Lisbeth Renshaw, MD - Assisting  PHYSICIAN ASSISTANT:   ASSISTANTS:    ANESTHESIA:   general  EBL:  Total I/O In: 1000 [I.V.:1000] Out: 250 [Urine:50; Blood:200]  BLOOD ADMINISTERED:none  DRAINS: none   LOCAL MEDICATIONS USED:  MARCAINE     SPECIMEN:  No Specimen  DISPOSITION OF SPECIMEN:  N/A  COUNTS:  YES  TOURNIQUET:  * No tourniquets in log *  DICTATION: .Dragon Dictation  PLAN OF CARE: Admit to inpatient   PATIENT DISPOSITION:  PACU - hemodynamically stable.   Delay start of Pharmacological VTE agent (>24hrs) due to surgical blood loss or risk of bleeding: yes

## 2015-03-25 NOTE — Anesthesia Postprocedure Evaluation (Signed)
  Anesthesia Post-op Note  Patient: Melissa [name]  Procedure(s) Performed: Procedure(s): POSTERIOR LUMBAR INTERBODY FUSION LUMBAR FOUR-FIVE (N/A)  Patient Location: PACU  Anesthesia Type:General  Level of Consciousness: sedated, patient cooperative and responds to stimulation  Airway and Oxygen Therapy: Patient Spontanous Breathing  Post-op Pain: none  Post-op Assessment: Post-op Vital signs reviewed, Patient's Cardiovascular Status Stable, Respiratory Function Stable, Patent Airway, No signs of Nausea or vomiting and Pain level controlled LLE Motor Response: Purposeful movement LLE Sensation: Full sensation RLE Motor Response: Purposeful movement RLE Sensation: Full sensation      Post-op Vital Signs: Reviewed and stable  Last Vitals:  Filed Vitals:   03/25/15 1430  BP: 119/68  Pulse:   Temp:   Resp:     Complications: No apparent anesthesia complications

## 2015-03-26 ENCOUNTER — Encounter (HOSPITAL_COMMUNITY): Payer: Self-pay | Admitting: Neurosurgery

## 2015-03-26 ENCOUNTER — Ambulatory Visit: Payer: Medicare Other | Admitting: Anesthesiology

## 2015-03-26 MED ORDER — KETOROLAC TROMETHAMINE 30 MG/ML IJ SOLN
30.0000 mg | Freq: Four times a day (QID) | INTRAMUSCULAR | Status: DC
  Administered 2015-03-26 – 2015-03-27 (×5): 30 mg via INTRAVENOUS
  Filled 2015-03-26 (×5): qty 1

## 2015-03-26 MED FILL — Heparin Sodium (Porcine) Inj 1000 Unit/ML: INTRAMUSCULAR | Qty: 30 | Status: AC

## 2015-03-26 MED FILL — Sodium Chloride IV Soln 0.9%: INTRAVENOUS | Qty: 1000 | Status: AC

## 2015-03-26 NOTE — Evaluation (Signed)
Physical Therapy Evaluation Patient Details Name: Melissa [name] MRN: [id number] DOB: [birth year] Today's Date: 03/26/2015   History of Present Illness  39 y/o s/p L4-L5 decompressive fusion (03/25/15)  Clinical Impression  Pt admitted with above diagnosis. Pt currently with functional limitations due to the deficits listed below (see PT Problem List). Pt will benefit from skilled PT to increase their independence and safety with mobility to allow discharge to the venue listed below.  Pt with 8-9/10 pain level in back and R LE (primarily upper lateral thigh).  Educated on back precautions and incorporating into mobility.  Pt required MIN A with bed mobility.  Pt able to ambulate 40' with RW and MIN A with R LE having more difficulty progressing as gait progressed with eventual shuffle on R LE.  Pt may benefit from outpatient PT once MD clears her for this.     Follow Up Recommendations Outpatient PT (once cleared by MD)    Equipment Recommendations  Rolling walker with 5" wheels    Recommendations for Other Services       Precautions / Restrictions Precautions Precautions: Back;Fall Precaution Booklet Issued: Yes (comment) Precaution Comments: Reviewed with pt. Required Braces or Orthoses: Spinal Brace Spinal Brace: Lumbar corset Restrictions Weight Bearing Restrictions: No      Mobility  Bed Mobility Overal bed mobility: Needs Assistance Bed Mobility: Supine to Sit;Sit to Supine     Supine to sit: Min assist Sit to supine: Min assist   General bed mobility comments: A for legs with sit > supine and A for turnk with supine > sit with cueing for proper body mechanics.  Pt very guarded and moaning with bed mobility.  Transfers Overall transfer level: Needs assistance Equipment used: Rolling walker (2 wheeled) Transfers: Sit to/from Stand Sit to Stand: Min guard         General transfer comment: cues for hand placement.  Ambulation/Gait Ambulation/Gait assistance: Min  guard;Min assist Ambulation Distance (Feet): 70 Feet Assistive device: Rolling walker (2 wheeled) Gait Pattern/deviations: Decreased step length - left;Decreased stance time - right;Shuffle   Gait velocity interpretation: Below normal speed for age/gender General Gait Details: As gait progressed R LE began having more difficulty  progressing forward with eventual shuffle on R with flexed knee  Stairs            Wheelchair Mobility    Modified Rankin (Stroke Patients Only)       Balance Overall balance assessment: Needs assistance           Standing balance-Leahy Scale: Poor Standing balance comment: requires UE support                             Pertinent Vitals/Pain Pain Assessment: 0-10 Pain Score: 8  Pain Location: 8/10 R leg and 9/10 in back Pain Descriptors / Indicators: Operative site guarding;Spasm;Sore;Grimacing (grabbing) Pain Intervention(s): Premedicated before session;Ice applied;Monitored during session;Repositioned;Relaxation    Home Living Family/patient expects to be discharged to:: Private residence Living Arrangements: Spouse/significant other;Children Available Help at Discharge: Available PRN/intermittently;Family   Home Access: Stairs to enter Entrance Stairs-Rails: None Entrance Stairs-Number of Steps: 3 Home Layout: One level Home Equipment: None      Prior Function Level of Independence: Independent         Comments: Pt reports some falls at home     Hand Dominance        Extremity/Trunk Assessment   Upper Extremity Assessment: Defer to OT evaluation  Lower Extremity Assessment: RLE deficits/detail;Overall WFL for tasks assessed RLE Deficits / Details: at least 3/5 with ankle df increasing R lateral thigh pain    Cervical / Trunk Assessment: Other exceptions  Communication   Communication: No difficulties  Cognition Arousal/Alertness: Awake/alert Behavior During Therapy: WFL for tasks  assessed/performed Overall Cognitive Status: Within Functional Limits for tasks assessed                      General Comments      Exercises        Assessment/Plan    PT Assessment Patient needs continued PT services  PT Diagnosis Difficulty walking;Acute pain   PT Problem List Decreased strength;Decreased balance;Decreased mobility;Pain  PT Treatment Interventions Gait training;Stair training;DME instruction;Functional mobility training;Therapeutic activities;Therapeutic exercise;Balance training   PT Goals (Current goals can be found in the Care Plan section) Acute Rehab PT Goals Patient Stated Goal: decreased pain PT Goal Formulation: With patient Time For Goal Achievement: 04/09/15 Potential to Achieve Goals: Good    Frequency Min 5X/week   Barriers to discharge        Co-evaluation               End of Session Equipment Utilized During Treatment: Gait belt Activity Tolerance: Patient limited by pain Patient left: in chair;with call bell/phone within reach Nurse Communication: Mobility status         Time: 2956-2130 PT Time Calculation (min) (ACUTE ONLY): 32 min   Charges:   PT Evaluation $Initial PT Evaluation Tier I: 1 Procedure PT Treatments $Gait Training: 8-22 mins   PT G Codes:        SMITH,KAREN LUBECK 03/26/2015, 10:53 AM

## 2015-03-26 NOTE — Progress Notes (Addendum)
Occupational Therapy Evaluation Patient Details Name: Melissa [name] MRN: [id number] DOB: 03/04/1976 Today's Date: 03/26/2015    History of Present Illness 39 y/o s/p L4-L5 decompressive fusion (03/25/15)   Clinical Impression   PTA, pt independent with ADL. States she has had several falls at home due to RLE weakness. Began education regarding ADL and functional mobility following back precautions with use of DME and AE. Written information given. Will plan to see in am to complete education and further assess need for AE to facilitate safe D/C home with intermittent S.Recommend HHOT as pt has limited caregiver support and needs to be mod I.  Nsg notified of DME needs.    Follow Up Recommendations  HHOT follow up;Supervision - Intermittent    Equipment Recommendations  3 in 1 bedside comode;Other (comment) (RW)    Recommendations for Other Services       Precautions / Restrictions Precautions Precautions: Back;Fall Precaution Booklet Issued: Yes (comment) Precaution Comments: Reviewed with pt. Required Braces or Orthoses: Spinal Brace Spinal Brace: Lumbar corset Restrictions Weight Bearing Restrictions: No      Mobility Bed Mobility Overal bed mobility: Needs Assistance Bed Mobility: Sit to Sidelying     Supine to sit: Supervision Sit to supine: Min assist   General bed mobility comments:  s for technique  Transfers Overall transfer level: Needs assistance Equipment used: Rolling walker (2 wheeled) Transfers: Sit to/from [employer] Sit to Stand: Min guard Stand pivot transfers: Min guard       General transfer comment: good carry over from earlier session    Balance Overall balance assessment: Needs assistance           Standing balance-Leahy Scale: Fair Standing balance comment: able to release for ADL                            ADL Overall ADL's : Needs assistance/impaired     Grooming: Supervision/safety;Set  up;Standing   Upper Body Bathing: Set up;Supervision/ safety;Sitting   Lower Body Bathing: Minimal assistance;Sit to/from stand   Upper Body Dressing : Set up;Supervision/safety;Sitting   Lower Body Dressing: Minimal assistance;Sit to/from stand;Adhering to back precautions   Toilet Transfer: Ambulation;Minimal assistance;Regular Toilet   Toileting- Clothing Manipulation and Hygiene: Minimal assistance Toileting - Clothing Manipulation Details (indicate cue type and reason): vc to not twist and adherre to back precautions     Functional mobility during ADLs: Minimal assistance;Rolling walker;Cueing for safety;Cueing for sequencing General ADL Comments: Began education on back precautions and ADL using compensatory techniques. Demonstrate use of AE for ADL if needed. Pt primarily using cross leg technique but would benefit from reacher and possible long handled sponge and tongs. Pt prefers to not use tongs if able.   Pt states that her fiance will not help her "very much" - she needs to be mod I.hil     Vision     Perception     Praxis      Pertinent Vitals/Pain Pain Assessment: 0-10 Pain Score: 9  Pain Location: back/R leg Pain Descriptors / Indicators: Cramping;Burning;Aching Pain Intervention(s): Limited activity within patient's tolerance;Monitored during session;Repositioned;Premedicated before session;Ice applied     Hand Dominance     Extremity/Trunk Assessment Upper Extremity Assessment Upper Extremity Assessment: Defer to OT evaluation   Lower Extremity Assessment Lower Extremity Assessment: Defer to PT evaluation (RLE weakness) RLE Deficits / Details: at least 3/5 with ankle df increasing R lateral thigh pain RLE: Unable to fully assess  due to pain   Cervical / Trunk Assessment Cervical / Trunk Assessment: Other exceptions Cervical / Trunk Exceptions: lumbar corset   Communication Communication Communication: No difficulties   Cognition  Arousal/Alertness: Awake/alert Behavior During Therapy: WFL for tasks assessed/performed Overall Cognitive Status: Within Functional Limits for tasks assessed                     General Comments   Pt very appreciative of help    Exercises       Shoulder Instructions      Home Living Family/patient expects to be discharged to:: Private residence Living Arrangements: Spouse/significant other;Children Available Help at Discharge: Available PRN/intermittently;Family Type of Home: House Home Access: Stairs to enter Secretary/administrator of Steps: 3 Entrance Stairs-Rails: None Home Layout: One level     Bathroom Shower/Tub: Tub/shower unit Shower/tub characteristics: Engineer, building services: Standard Bathroom Accessibility: Yes How Accessible: Accessible via walker Home Equipment: None          Prior Functioning/Environment Level of Independence: Independent        Comments: Pt reports some falls at home    OT Diagnosis: Generalized weakness;Acute pain   OT Problem List: Decreased strength;Decreased activity tolerance;Decreased knowledge of use of DME or AE;Decreased knowledge of precautions;Obesity;Pain   OT Treatment/Interventions: Self-care/ADL training;DME and/or AE instruction;Therapeutic activities;Patient/family education    OT Goals(Current goals can be found in the care plan section) Acute Rehab OT Goals Patient Stated Goal: to be able to take care of myself OT Goal Formulation: With patient Time For Goal Achievement: 04/09/15 Potential to Achieve Goals: Good  OT Frequency: Min 2X/week   Barriers to D/C:            Co-evaluation              End of Session Equipment Utilized During Treatment: Gait belt;Rolling walker;Back brace Nurse Communication: Mobility status;Other (comment) (DME needs)  Activity Tolerance: Patient tolerated treatment well Patient left: in bed;with call bell/phone within reach   Time: 1031-1109 OT Time  Calculation (min): 38 min Charges:  OT General Charges $OT Visit: 1 Procedure OT Evaluation $Initial OT Evaluation Tier I: 1 Procedure OT Treatments $Self Care/Home Management : 23-37 mins G-Codes:    WARD,HILLARY 03/30/2015, 11:25 AM   Luisa Dago, OTR/L  [phone] 03/30/2015

## 2015-03-26 NOTE — Progress Notes (Signed)
Postop day 1. Back pain reasonably well-controlled. Note some pain and paresthesias in her right leg similar to preop. States her right leg gave out when walking yesterday.  Afebrile. Vitals are stable. Urine output good. Awake and alert. Oriented and appropriate. Motor and sensory exam intact although somewhat poor effort with right lower extremity. Dressing dry. Chest and abdomen benign.  Progressing slowly following lumbar decompression and fusion. Add Toradol for pain control. Possible discharge tomorrow.

## 2015-03-27 MED ORDER — DIAZEPAM 5 MG PO TABS: 5.0000 mg | ORAL_TABLET | Freq: Four times a day (QID) | ORAL | Status: DC | PRN

## 2015-03-27 MED ORDER — OXYCODONE-ACETAMINOPHEN 10-325 MG PO TABS: 1.0000 | ORAL_TABLET | ORAL | Status: DC | PRN

## 2015-03-27 NOTE — Progress Notes (Signed)
Pt and boyfriend given D/C instructions with Rx's, verbal understanding was provided. Pt's incision is clean and dry with no sign of infection. Pt's IV was removed prior to D/C. Pt's Home Health was arranged and she received RW and 3-N-1 from [facility] prior to D/C. Pt D/C'd home via wheelchair  per MD order. Pt is stable @ D/C and has no other needs at this time. Rema Fendt, RN

## 2015-03-27 NOTE — Discharge Summary (Signed)
Physician Discharge Summary  Patient ID: Melissa [name] MRN: [id number] DOB/AGE: Apr 21, 1976 39 y.o.  Admit date: 03/25/2015 Discharge date: 03/27/2015  Admission Diagnoses:  Discharge Diagnoses:  Principal Problem:   DDD (degenerative disc disease), lumbar   Discharged Condition: good  Hospital Course: Patient admitted the hospital where she underwent uncomplicated L4-5 decompression infusion. Postoperative she is doing well. Back and leg pain improving. Standing and walking with minimal difficulty ready for discharge home.  Consults:   Significant Diagnostic Studies:   Treatments:   Discharge Exam: Blood pressure 114/54, pulse 75, temperature 98.2 F (36.8 C), temperature source Oral, resp. rate 18, height  (1.651 m), weight 94.348 kg (208 lb), last menstrual period 03/02/2015, SpO2 99 %. Awake and alert. Oriented and appropriate. Cranial nerve function intact. Motor and sensory function extremities normal. Wound clean and dry. Chest and abdomen benign. Disposition: Final discharge disposition not confirmed     Medication List    TAKE these medications        diazepam 5 MG tablet  Commonly known as:  VALIUM  Take 1-2 tablets (5-10 mg total) by mouth every 6 (six) hours as needed for muscle spasms.     gabapentin 100 MG capsule  Commonly known as:  NEURONTIN  Take 100 mg by mouth 3 (three) times daily.     HYDROcodone-acetaminophen 5-325 MG per tablet  Commonly known as:  NORCO/VICODIN  Take 1 tablet by mouth every 6 (six) hours as needed (pain).     ICY HOT EX  Apply 1 application topically every 2 (two) hours as needed (pain).     oxyCODONE-acetaminophen 10-325 MG per tablet  Commonly known as:  PERCOCET  Take 1-2 tablets by mouth every 4 (four) hours as needed for pain.     traMADol 50 MG tablet  Commonly known as:  ULTRAM  Take 50 mg by mouth 2 (two) times daily as needed (pain).           Follow-up Information    Follow up with Temple Pacini, MD.    Specialty:  Neurosurgery   Contact information:   [address] [place] [address] [phone]       Signed: Temple Pacini 03/27/2015, 7:44 AM

## 2015-03-27 NOTE — Progress Notes (Signed)
Physical Therapy Treatment Patient Details Name: Melissa [name] MRN: [id number] DOB: 1976-03-31 Today's Date: 03/27/2015    History of Present Illness 39 y/o s/p L4-L5 decompressive fusion (03/25/15)    PT Comments    Pt progressing slowly towards physical therapy goals. Was able to improve ambulation distance this session however pain was a significant limiting factor. LOB on stairs requiring assist to recover. Pt will need assistance to enter home, and pt states she is unsure how much assistance her fiance will be willing to give her. Would benefit from continued therapy for transfer training, gait training, and to improve tolerance for functional activity. Recommending HHPT at d/c. Will continue to follow.   Follow Up Recommendations  Home health PT;Supervision for mobility/OOB     Equipment Recommendations  Rolling walker with 5" wheels;3in1 (PT)    Recommendations for Other Services       Precautions / Restrictions Precautions Precautions: Back;Fall Precaution Booklet Issued: Yes (comment) Precaution Comments: Reviewed with pt. Required Braces or Orthoses: Spinal Brace Spinal Brace: Lumbar corset Restrictions Weight Bearing Restrictions: No    Mobility  Bed Mobility Overal bed mobility: Needs Assistance Bed Mobility: Rolling;Sidelying to Sit Rolling: Min guard Sidelying to sit: Min assist       General bed mobility comments: Pt practiced log roll from flat bed with no railings to simulate home environment. Min assist required for trunk elevation to full sitting position.   Transfers Overall transfer level: Needs assistance Equipment used: Rolling walker (2 wheeled) Transfers: Sit to/from Stand Sit to Stand: Min assist         General transfer comment: Pt initially required min guard for power-up to standing. As pain increased during session, min assist was required for steadying assist and safety.   Ambulation/Gait Ambulation/Gait assistance: Min  guard Ambulation Distance (Feet): 200 Feet Assistive device: Rolling walker (2 wheeled) Gait Pattern/deviations: Step-through pattern;Decreased stride length;Trunk flexed Gait velocity: Decreased Gait velocity interpretation: Below normal speed for age/gender General Gait Details: Pt with difficulty with upright posture due to pain. Wheelchair was brought up for pt to sit due to pain in the RLE and back.    Stairs Stairs: Yes Stairs assistance: Min assist Stair Management: No rails;Step to pattern;Sideways;Forwards Number of Stairs: 3 General stair comments: Pt has no railings at home to enter, and close hands-on guarding was provided with +2 present for safety. Pt declined HHA and pushed up on her RLE during ascending forwards. She declines attempting ascending with LLE. To descend stairs pt turned sideways which descreased pain. One LOB in which min assist was provided to recover.   Wheelchair Mobility    Modified Rankin (Stroke Patients Only)       Balance Overall balance assessment: Needs assistance Sitting-balance support: Feet supported;No upper extremity supported Sitting balance-Leahy Scale: Fair       Standing balance-Leahy Scale: Fair Standing balance comment: static standing                    Cognition Arousal/Alertness: Awake/alert Behavior During Therapy: WFL for tasks assessed/performed Overall Cognitive Status: Within Functional Limits for tasks assessed                      Exercises      General Comments        Pertinent Vitals/Pain Pain Assessment: Faces Faces Pain Scale: Hurts whole lot Pain Location: Back/R LE Pain Descriptors / Indicators: Aching;Cramping Pain Intervention(s): Limited activity within patient's tolerance;Monitored during session;Repositioned  Home Living                      Prior Function            PT Goals (current goals can now be found in the care plan section) Acute Rehab PT  Goals Patient Stated Goal: to be able to take care of myself PT Goal Formulation: With patient Time For Goal Achievement: 04/09/15 Potential to Achieve Goals: Good Progress towards PT goals: Progressing toward goals    Frequency  Min 5X/week    PT Plan Discharge plan needs to be updated    Co-evaluation             End of Session Equipment Utilized During Treatment: Gait belt;Back brace Activity Tolerance: Patient limited by pain Patient left: in chair;with call bell/phone within reach     Time: 0759-0848 PT Time Calculation (min) (ACUTE ONLY): 49 min  Charges:  $Gait Training: 23-37 mins $Therapeutic Activity: 8-22 mins                    G Codes:      Conni Slipper 04/04/15, 9:04 AM   Conni Slipper, PT, DPT Acute Rehabilitation Services Pager: [phone]

## 2015-03-27 NOTE — Progress Notes (Signed)
Occupational Therapy Treatment Patient Details Name: Melissa [name] MRN: [id number] DOB: 06-20-1976 Today's Date: 03/27/2015    History of present illness 39 y/o s/p L4-L5 decompressive fusion (03/25/15)   OT comments  Completed education with pt regarding ADL and functional mobility. Pt voiced concerns over limited caregiver support. Will recommend follow up with [place]. Pt safe to D/C home when medically stable.   Follow Up Recommendations  Home health OT;Supervision - Intermittent    Equipment Recommendations  3 in 1 bedside comode;Other (comment)    Recommendations for Other Services      Precautions / Restrictions Precautions Precautions: Back;Fall Precaution Booklet Issued: Yes (comment) Precaution Comments: Reviewed with pt. Required Braces or Orthoses: Spinal Brace Spinal Brace: Lumbar corset Restrictions Weight Bearing Restrictions: No       Mobility   Balance    ADL                                         General ADL Comments: completed education regarding use of AE and DME for ADL. Caregiver present for educaiton. Pt voiced concerns about limited caregiver assistance. will recommend HHOT. Pt able to return demonstrate use of AE. Given reacher, long handled sponge and toilet tongs (CAID). Discussed home safety and reducing risk of falls. Educated fiance on need to assist pt after D/C - he verbalized understanding. Asking questions regarding resuming sexual activity. Booklet given. Recommended pt discuss this with MD prior to resuming.      Vision                     Perception     Praxis      Cognition   Behavior During Therapy: WFL for tasks assessed/performed Overall Cognitive Status: Within Functional Limits for tasks assessed                       Extremity/Trunk Assessment               Exercises     Shoulder Instructions       General Comments      Pertinent Vitals/ Pain       Pain Assessment:  Faces Faces Pain Scale: Hurts little more Pain Location: Back/RLE Pain Descriptors / Indicators: Aching;Grimacing Pain Intervention(s): Limited activity within patient's tolerance  Home Living                                          Prior Functioning/Environment              Frequency       Progress Toward Goals  OT Goals(current goals can now be found in the care plan section)  Progress towards OT goals: Goals met/education completed, patient discharged from OT (continue with HHOT)  Acute Rehab OT Goals Patient Stated Goal: to be able to take care of myself OT Goal Formulation: With patient Time For Goal Achievement: 04/09/15 Potential to Achieve Goals: Good ADL Goals Pt Will Perform Lower Body Bathing: with supervision;with adaptive equipment;sit to/from stand Pt Will Perform Lower Body Dressing: with supervision;with adaptive equipment;sit to/from stand Pt Will Perform Toileting - Clothing Manipulation and hygiene: with supervision;sit to/from stand;with adaptive equipment;sitting/lateral leans Pt Will Perform Tub/Shower Transfer: with supervision;3 in 1;rolling walker;ambulating;Tub transfer Additional ADL Goal #1:  Pt will independently verbalized 3/3 back precautions  Plan Discharge plan needs to be updated    Co-evaluation                 End of Session Equipment Utilized During Treatment: Back brace   Activity Tolerance Patient tolerated treatment well   Patient Left in chair;with call bell/phone within reach;with family/visitor present   Nurse Communication Mobility status        Time: 5277-8242 OT Time Calculation (min): 20 min  Charges: OT General Charges $OT Visit: 1 Procedure OT Treatments $Self Care/Home Management : 8-22 mins  WARD,HILLARY 03/27/2015, 10:08 AM   Maurie Boettcher, OTR/L  [phone] 03/27/2015

## 2015-03-27 NOTE — Care Management Note (Signed)
Case Management Note  Patient Details  Name: [name] MRN: [id number] Date of Birth: 1976-03-22  Subjective/Objective:    39 y.o. F admitted to the hospital on 03/25/2015 for Posterior Lumbar Interbody Fusion 4-5. Discharged to Home with HHPT/OT provided by [facility] services (confirmed with [facility]).               Action/Plan:Will sign off for now.    Expected Discharge Date:                  Expected Discharge Plan:  Home w Home Health Services  In-House Referral:     Discharge planning Services  CM Consult  Post Acute Care Choice:  Home Health Choice offered to:  Patient  DME Arranged:    DME Agency:  [facility]  HH Arranged:  PT, OT [facility] Agency:  [facility]  Status of Service:  Completed, signed off  Medicare Important Message Given:    Date Medicare IM Given:    Medicare IM give by:    Date Additional Medicare IM Given:    Additional Medicare Important Message give by:     If discussed at Long Length of Stay Meetings, dates discussed:    Additional Comments:  Yvone Neu, RN 03/27/2015, 12:12 PM

## 2015-03-27 NOTE — Discharge Instructions (Signed)
Wound Care °Keep incision covered and dry for two days.  If you shower, cover incision with plastic wrap.  °Do not put any creams, lotions, or ointments on incision. °Leave steri-strips on back.  They will fall off by themselves. °Activity °Walk each and every day, increasing distance each day. °No lifting greater than 5 lbs.  Avoid excessive neck motion. °No driving for 2 weeks; may ride as a passenger locally. °If provided with back brace, wear when out of bed.  It is not necessary to wear brace in bed. °Diet °Resume your normal diet.  °Return to Work °Will be discussed at you follow up appointment. °Call Your Doctor If Any of These Occur °Redness, drainage, or swelling at the wound.  °Temperature greater than 101 degrees. °Severe pain not relieved by pain medication. °Incision starts to come apart. °Follow Up Appt °Call today for appointment in 1-2 weeks ([phone]) or for problems.  If you have any hardware placed in your spine, you will need an x-ray before your appointment. ° ° °Spinal Fusion °Spinal fusion is a procedure to make 2 or more of the bones in your spinal column (vertebrae) grow together (fuse). This procedure stops movement between the vertebrae and can relieve pain and prevent deformity.  °Spinal fusion is used to treat the following conditions: °· Fractures of the spine. °· Herniated disk (the spongy material [cartilage] between the vertebrae). °· Abnormal curvatures of the spine, such as scoliosis or kyphosis. °· A weak or an unstable spine, caused by infections or tumor. °RISKS AND COMPLICATIONS °Complications associated with spinal fusion are rare, but they can occur. Possible complications include: °· Bleeding. °· Infection near the incision. °· Nerve damage. Signs of nerve damage are back pain, pain in one or both legs, weakness, or numbness. °· Spinal fluid leakage. °· Blood clot in your leg, which can move to your lungs. °· Difficulty controlling urination or bowel movements. °BEFORE THE  PROCEDURE °· A medical evaluation will be done. This will include a physical exam, blood tests, and imaging exams. °· You will talk with an anesthesiologist. This is the person who will be in charge of the anesthesia during the procedure. Spinal fusion usually requires that you are asleep during the procedure (general anesthesia). °· You will need to stop taking certain medicines, particularly those associated with an increased risk of bleeding. Ask your caregiver about changing or stopping your regular medicines. °· If you smoke, you will need to stop at least 2 weeks before the procedure. Smoking can slow down the healing process, especially fusion of the vertebrae, and increase the risk of complications. °· Do not eat or drink anything for at least 8 hours before the procedure. °PROCEDURE  °A cut (incision) is made over the vertebrae that will be fused. The back muscles are separated from the vertebrae. If you are having this procedure to treat a herniated disk, the disc material pressing on the nerve root is removed (decompression). The area where the disk is removed is then filled with extra bone. Bone from another part of your body (autogenous bone) or bone from a bone donor (allograft bone) may be used. The extra bone promotes fusion between the vertebrae. Sometimes, specific medicines are added to the fusion area to promote bone healing. In most cases, screws and rods or metal plates will be used to attach the vertebrae to stabilize them while they fuse.  °AFTER THE PROCEDURE  °· You will stay in a recovery area until the anesthesia has worn   off. Your blood pressure and pulse will be checked frequently. °· You will be given antibiotics to prevent infection. °· You may continue to receive fluids through an intravenous (IV) tube while you are still in the hospital. °· Pain after surgery is normal. You will be given pain medicine. °· You will be taught how to move correctly and how to stand and walk. While in  bed, you will be instructed to turn frequently, using a "log rolling" technique, in which the entire body is moved without twisting the back. °Document Released: 03/20/2003 Document Revised: 09/13/2011 Document Reviewed: 09/03/2010 °[employer]® Patient Information ©2015 [employer]. This information is not intended to replace advice given to you by your health care provider. Make sure you discuss any questions you have with your health care provider. ° °

## 2015-03-29 DIAGNOSIS — M5136 Other intervertebral disc degeneration, lumbar region: Secondary | ICD-10-CM | POA: Diagnosis not present

## 2015-03-29 DIAGNOSIS — J45909 Unspecified asthma, uncomplicated: Secondary | ICD-10-CM | POA: Diagnosis not present

## 2015-03-29 DIAGNOSIS — Z4789 Encounter for other orthopedic aftercare: Secondary | ICD-10-CM | POA: Diagnosis not present

## 2015-03-29 DIAGNOSIS — Z981 Arthrodesis status: Secondary | ICD-10-CM | POA: Diagnosis not present

## 2015-03-29 DIAGNOSIS — Z720 Tobacco use: Secondary | ICD-10-CM | POA: Diagnosis not present

## 2015-03-31 DIAGNOSIS — Z720 Tobacco use: Secondary | ICD-10-CM | POA: Diagnosis not present

## 2015-03-31 DIAGNOSIS — J45909 Unspecified asthma, uncomplicated: Secondary | ICD-10-CM | POA: Diagnosis not present

## 2015-03-31 DIAGNOSIS — M5136 Other intervertebral disc degeneration, lumbar region: Secondary | ICD-10-CM | POA: Diagnosis not present

## 2015-03-31 DIAGNOSIS — Z981 Arthrodesis status: Secondary | ICD-10-CM | POA: Diagnosis not present

## 2015-03-31 DIAGNOSIS — Z4789 Encounter for other orthopedic aftercare: Secondary | ICD-10-CM | POA: Diagnosis not present

## 2015-04-01 DIAGNOSIS — Z4789 Encounter for other orthopedic aftercare: Secondary | ICD-10-CM | POA: Diagnosis not present

## 2015-04-01 DIAGNOSIS — M5136 Other intervertebral disc degeneration, lumbar region: Secondary | ICD-10-CM | POA: Diagnosis not present

## 2015-04-01 DIAGNOSIS — Z981 Arthrodesis status: Secondary | ICD-10-CM | POA: Diagnosis not present

## 2015-04-01 DIAGNOSIS — Z720 Tobacco use: Secondary | ICD-10-CM | POA: Diagnosis not present

## 2015-04-01 DIAGNOSIS — J45909 Unspecified asthma, uncomplicated: Secondary | ICD-10-CM | POA: Diagnosis not present

## 2015-04-02 ENCOUNTER — Emergency Department
Admission: EM | Admit: 2015-04-02 | Discharge: 2015-04-02 | Disposition: A | Discharge status: Home / Self Care | Payer: Medicare Other | Attending: Emergency Medicine | Admitting: Emergency Medicine

## 2015-04-02 ENCOUNTER — Encounter: Payer: Self-pay | Admitting: Medical Oncology

## 2015-04-02 DIAGNOSIS — Z79899 Other long term (current) drug therapy: Secondary | ICD-10-CM | POA: Diagnosis not present

## 2015-04-02 DIAGNOSIS — M5441 Lumbago with sciatica, right side: Secondary | ICD-10-CM | POA: Diagnosis not present

## 2015-04-02 DIAGNOSIS — M545 Low back pain: Secondary | ICD-10-CM | POA: Diagnosis present

## 2015-04-02 DIAGNOSIS — M79604 Pain in right leg: Secondary | ICD-10-CM | POA: Diagnosis not present

## 2015-04-02 DIAGNOSIS — Z981 Arthrodesis status: Secondary | ICD-10-CM | POA: Diagnosis not present

## 2015-04-02 DIAGNOSIS — Z4789 Encounter for other orthopedic aftercare: Secondary | ICD-10-CM | POA: Diagnosis not present

## 2015-04-02 DIAGNOSIS — J45909 Unspecified asthma, uncomplicated: Secondary | ICD-10-CM | POA: Diagnosis not present

## 2015-04-02 DIAGNOSIS — Z720 Tobacco use: Secondary | ICD-10-CM | POA: Diagnosis not present

## 2015-04-02 DIAGNOSIS — M5136 Other intervertebral disc degeneration, lumbar region: Secondary | ICD-10-CM | POA: Diagnosis not present

## 2015-04-02 DIAGNOSIS — M549 Dorsalgia, unspecified: Secondary | ICD-10-CM | POA: Diagnosis not present

## 2015-04-02 LAB — COMPREHENSIVE METABOLIC PANEL
ALBUMIN: 3.6 g/dL (ref 3.5–5.0)
ALT: 13 U/L — ABNORMAL LOW (ref 14–54)
ANION GAP: 6 (ref 5–15)
AST: 22 U/L (ref 15–41)
Alkaline Phosphatase: 90 U/L (ref 38–126)
BUN: 6 mg/dL (ref 6–20)
CHLORIDE: 107 mmol/L (ref 101–111)
CO2: 25 mmol/L (ref 22–32)
Calcium: 9.1 mg/dL (ref 8.9–10.3)
Creatinine, Ser: 0.84 mg/dL (ref 0.44–1.00)
GFR calc Af Amer: >60 mL/min (ref >60)
GFR calc non Af Amer: >60 mL/min (ref >60)
GLUCOSE: 96 mg/dL (ref 65–99)
POTASSIUM: 3.9 mmol/L (ref 3.5–5.1)
SODIUM: 138 mmol/L (ref 135–145)
Total Bilirubin: 0.5 mg/dL (ref 0.3–1.2)
Total Protein: 8 g/dL (ref 6.5–8.1)

## 2015-04-02 LAB — CBC WITH DIFFERENTIAL/PLATELET
BASOS ABS: 0.1 10*3/uL (ref 0–0.1)
Basophils Relative: 1 %
Eosinophils Absolute: 0.7 10*3/uL (ref 0–0.7)
Eosinophils Relative: 6 %
HEMATOCRIT: 32.8 % — AB (ref 35.0–47.0)
Hemoglobin: 11.1 g/dL — ABNORMAL LOW (ref 12.0–16.0)
LYMPHS ABS: 2.7 10*3/uL (ref 1.0–3.6)
LYMPHS PCT: 20 %
MCH: 32.2 pg (ref 26.0–34.0)
MCHC: 34 g/dL (ref 32.0–36.0)
MCV: 94.8 fL (ref 80.0–100.0)
MONO ABS: 0.9 10*3/uL (ref 0.2–0.9)
MONOS PCT: 7 %
NEUTROS ABS: 8.8 10*3/uL — AB (ref 1.4–6.5)
Neutrophils Relative %: 66 %
Platelets: 291 10*3/uL (ref 150–440)
RBC: 3.46 MIL/uL — ABNORMAL LOW (ref 3.80–5.20)
RDW: 12.2 % (ref 11.5–14.5)
WBC: 13.3 10*3/uL — ABNORMAL HIGH (ref 3.6–11.0)

## 2015-04-02 MED ORDER — PREDNISONE 20 MG PO TABS: 40.0000 mg | ORAL_TABLET | Freq: Every day | ORAL | Status: DC

## 2015-04-02 MED ORDER — HYDROMORPHONE HCL 1 MG/ML IJ SOLN
1.0000 mg | Freq: Once | INTRAMUSCULAR | Status: AC
  Administered 2015-04-02: 1 mg via INTRAVENOUS
  Filled 2015-04-02: qty 1

## 2015-04-02 NOTE — Discharge Instructions (Signed)
Please follow-up with Dr. Jordan Likes as soon as possible. Return to the emergency department for any fever, increased pain/weakness, or any other symptom personally concerning to yourself.   Back Pain, Adult Back pain is very common. The pain often gets better over time. The cause of back pain is usually not dangerous. Most people can learn to manage their back pain on their own.  HOME CARE   Stay active. Start with short walks on flat ground if you can. Try to walk farther each day.  Do not sit, drive, or stand in one place for more than 30 minutes. Do not stay in bed.  Do not avoid exercise or work. Activity can help your back heal faster.  Be careful when you bend or lift an object. Bend at your knees, keep the object close to you, and do not twist.  Sleep on a firm mattress. Lie on your side, and bend your knees. If you lie on your back, put a pillow under your knees.  Only take medicines as told by your doctor.  Put ice on the injured area.  Put ice in a plastic bag.  Place a towel between your skin and the bag.  Leave the ice on for 15-20 minutes, 03-04 times a day for the first 2 to 3 days. After that, you can switch between ice and heat packs.  Ask your doctor about back exercises or massage.  Avoid feeling anxious or stressed. Find good ways to deal with stress, such as exercise. GET HELP RIGHT AWAY IF:   Your pain does not go away with rest or medicine.  Your pain does not go away in 1 week.  You have new problems.  You do not feel well.  The pain spreads into your legs.  You cannot control when you poop (bowel movement) or pee (urinate).  Your arms or legs feel weak or lose feeling (numbness).  You feel sick to your stomach (nauseous) or throw up (vomit).  You have belly (abdominal) pain.  You feel like you may pass out (faint). MAKE SURE YOU:   Understand these instructions.  Will watch your condition.  Will get help right away if you are not doing well  or get worse. Document Released: 12/08/2007 Document Revised: 09/13/2011 Document Reviewed: 10/23/2013 [facility] Patient Information 2015 [place], [place]. This information is not intended to replace advice given to you by your health care provider. Make sure you discuss any questions you have with your health care provider.

## 2015-04-02 NOTE — ED Provider Notes (Signed)
[facility] Emergency Department Provider Note  Time seen: 1:06 PM  I have reviewed the triage vital signs and the nursing notes.   HISTORY  Chief Complaint Back Pain    HPI Melissa [name] is a 39 y.o. female with a past medical history of asthma, depression, seizure disorder, headaches, status post recent back surgery 03/25/15 at [facility], presents the emergency department with back pain and right leg weakness and numbness which she states happens intermittently for the past few months. States her pain medication at home has not been enough to control her pain so she came to the emergency department due to severity of pain. States right lower extremity weakness and numbness, but states this has been happening intermittently times months, Even before the surgery. Describes the pain as 10/10 and her lower back. Much worse with any type of movement.      Past Medical History  Diagnosis Date  . Hidradenitis 2007  . Kidney infection   . Heart murmur     after child was born - has never had any problems  . Asthma     as a young adult - not for many years  . Depression   . Kidney stones   . Seizures     as a baby   . Headache     migraines  . Anemia   . Family history of adverse reaction to anesthesia     Patient Active Problem List   Diagnosis Date Noted  . DDD (degenerative disc disease), lumbar 03/25/2015    Past Surgical History  Procedure Laterality Date  . Appendectomy  1997  . Knee surgery Left 1997  . Axillary hidradenitis excision Bilateral 2007  . Inguinal hidradenitis excision  06-18-14    left  . Kidney stone surgery    . Tubal ligation    . Cesarean section    . Posterior lumbar fusion N/A 03/25/2015    Procedure: POSTERIOR LUMBAR INTERBODY FUSION LUMBAR FOUR-FIVE;  Surgeon: Julio Sicks, MD;  Location: [facility] OR;  Service: Neurosurgery;  Laterality: N/A;    Current Outpatient Rx  Name  Route  Sig  Dispense  Refill  .  diazepam (VALIUM) 5 MG tablet   Oral   Take 1-2 tablets (5-10 mg total) by mouth every 6 (six) hours as needed for muscle spasms.   60 tablet   0   . gabapentin (NEURONTIN) 100 MG capsule   Oral   Take 100 mg by mouth 3 (three) times daily.      2   . HYDROcodone-acetaminophen (NORCO/VICODIN) 5-325 MG per tablet   Oral   Take 1 tablet by mouth every 6 (six) hours as needed (pain).       0   . Menthol, Topical Analgesic, (ICY HOT EX)   Topical   Apply 1 application topically every 2 (two) hours as needed (pain).         Marland Kitchen oxyCODONE-acetaminophen (PERCOCET) 10-325 MG per tablet   Oral   Take 1-2 tablets by mouth every 4 (four) hours as needed for pain.   90 tablet   0   . traMADol (ULTRAM) 50 MG tablet   Oral   Take 50 mg by mouth 2 (two) times daily as needed (pain).       1     Allergies Tylenol; Celebrex; and Other  Family History  Problem Relation Age of Onset  . Diabetes Mother   . Hypertension Mother     Social  History Social History  Substance Use Topics  . Smoking status: Current Every Day Smoker -- 0.50 packs/day for 10 years    Types: Cigarettes  . Smokeless tobacco: Never Used  . Alcohol Use: 12.6 oz/week    21 Cans of beer per week    Review of Systems Constitutional: Negative for fever. Cardiovascular: Negative for chest pain. Respiratory: Negative for shortness of breath. Gastrointestinal: Negative for abdominal pain Genitourinary: Negative for dysuria. Negative for incontinence. Musculoskeletal: Positive for lower back pain. Right leg pain and numbness. Neurological: Negative for headache 10-point ROS otherwise negative.  ____________________________________________   PHYSICAL EXAM:  VITAL SIGNS: ED Triage Vitals  Enc Vitals Group     BP 04/02/15 1146 122/68 mmHg     Pulse Rate 04/02/15 1146 82     Resp 04/02/15 1146 20     Temp 04/02/15 1146 98.3 F (36.8 C)     Temp Source 04/02/15 1146 Oral     SpO2 04/02/15 1146 100 %      Weight 04/02/15 1146 211 lb 10.3 oz (96.001 kg)     Height 04/02/15 1146  (1.626 m)     Head Cir --      Peak Flow --      Pain Score 04/02/15 1147 10     Pain Loc --      Pain Edu? --      Excl. in GC? --     Constitutional: Alert and oriented. Moderate distress due to pain. Crying. Eyes: Normal exam ENT   Head: Normocephalic and atraumatic.   Mouth/Throat: Mucous membranes are moist. Cardiovascular: Normal rate, regular rhythm Respiratory: Normal respiratory effort without tachypnea nor retractions. Breath sounds are clear  Gastrointestinal: Soft and nontender. No distention.   Musculoskeletal: Unable to palpate the lower back as the patient will not roll over or sit up due to pain. Patient is able to move right and left lower extremities. States significant lower back pain when attempting to move right lower extremity. States subjective numbness in the right lower extremity. Neurologic:  Normal speech and language. Subjective numbness in the right lower extremity. Decreased motor strength in the right lower extremity but states this is due to pain. Skin:  Skin is warm, dry and intact.  Psychiatric: Tearful, but overall normal for current situation.  ____________________________________________  INITIAL IMPRESSION / ASSESSMENT AND PLAN / ED COURSE  Pertinent labs & imaging results that were available during my care of the patient were reviewed by me and considered in my medical decision making (see chart for details).  Patient presents for increased lower back and right lower extremity pain. Patient had a surgery on 03/25/15 by Dr. Dutch Quint at [facility]. The surgery appears to be an L4/L5 decompression. The patient was discharged on 03/27/15 with a note stating that the patient had minimal discomfort with walking. Patient states her pain medications have been inadequate at home, and her pain has worsened she was not able to get out of bed today so she came to the  emergency department for evaluation.  Labs are largely within normal limits. I discussed the patient with Dr. Dutch Quint of neurosurgery [facility], he states no further workup is needed at this time, and he will be happy to follow-up the patient in the office. He recommends discharge with Medrol Dosepak. I discussed the findings with the patient she is agreeable to plan.   ____________________________________________   FINAL CLINICAL IMPRESSION(S) / ED DIAGNOSES  Lower back pain Right leg pain  Minna Antis, MD 04/02/15 [phone]

## 2015-04-02 NOTE — ED Notes (Signed)
Pt from home via ems with reports of having L5-L6 back surgery at [facility]- Dr. Jordan Likes on 03/25/15. Since then pt has continued to have worsening pain that pain meds are not relieving.

## 2015-04-03 DIAGNOSIS — Z720 Tobacco use: Secondary | ICD-10-CM | POA: Diagnosis not present

## 2015-04-03 DIAGNOSIS — Z4789 Encounter for other orthopedic aftercare: Secondary | ICD-10-CM | POA: Diagnosis not present

## 2015-04-03 DIAGNOSIS — M5136 Other intervertebral disc degeneration, lumbar region: Secondary | ICD-10-CM | POA: Diagnosis not present

## 2015-04-03 DIAGNOSIS — J45909 Unspecified asthma, uncomplicated: Secondary | ICD-10-CM | POA: Diagnosis not present

## 2015-04-03 DIAGNOSIS — Z981 Arthrodesis status: Secondary | ICD-10-CM | POA: Diagnosis not present

## 2015-04-08 DIAGNOSIS — Z981 Arthrodesis status: Secondary | ICD-10-CM | POA: Diagnosis not present

## 2015-04-08 DIAGNOSIS — M5136 Other intervertebral disc degeneration, lumbar region: Secondary | ICD-10-CM | POA: Diagnosis not present

## 2015-04-08 DIAGNOSIS — J45909 Unspecified asthma, uncomplicated: Secondary | ICD-10-CM | POA: Diagnosis not present

## 2015-04-08 DIAGNOSIS — Z720 Tobacco use: Secondary | ICD-10-CM | POA: Diagnosis not present

## 2015-04-08 DIAGNOSIS — Z4789 Encounter for other orthopedic aftercare: Secondary | ICD-10-CM | POA: Diagnosis not present

## 2015-04-09 DIAGNOSIS — M5136 Other intervertebral disc degeneration, lumbar region: Secondary | ICD-10-CM | POA: Diagnosis not present

## 2015-04-09 DIAGNOSIS — J45909 Unspecified asthma, uncomplicated: Secondary | ICD-10-CM | POA: Diagnosis not present

## 2015-04-09 DIAGNOSIS — Z4789 Encounter for other orthopedic aftercare: Secondary | ICD-10-CM | POA: Diagnosis not present

## 2015-04-09 DIAGNOSIS — Z720 Tobacco use: Secondary | ICD-10-CM | POA: Diagnosis not present

## 2015-04-09 DIAGNOSIS — Z981 Arthrodesis status: Secondary | ICD-10-CM | POA: Diagnosis not present

## 2015-04-10 DIAGNOSIS — J45909 Unspecified asthma, uncomplicated: Secondary | ICD-10-CM | POA: Diagnosis not present

## 2015-04-10 DIAGNOSIS — Z981 Arthrodesis status: Secondary | ICD-10-CM | POA: Diagnosis not present

## 2015-04-10 DIAGNOSIS — Z720 Tobacco use: Secondary | ICD-10-CM | POA: Diagnosis not present

## 2015-04-10 DIAGNOSIS — M5136 Other intervertebral disc degeneration, lumbar region: Secondary | ICD-10-CM | POA: Diagnosis not present

## 2015-04-10 DIAGNOSIS — Z4789 Encounter for other orthopedic aftercare: Secondary | ICD-10-CM | POA: Diagnosis not present

## 2015-04-15 DIAGNOSIS — M5136 Other intervertebral disc degeneration, lumbar region: Secondary | ICD-10-CM | POA: Diagnosis not present

## 2015-04-15 DIAGNOSIS — Z4789 Encounter for other orthopedic aftercare: Secondary | ICD-10-CM | POA: Diagnosis not present

## 2015-04-15 DIAGNOSIS — J45909 Unspecified asthma, uncomplicated: Secondary | ICD-10-CM | POA: Diagnosis not present

## 2015-04-15 DIAGNOSIS — Z720 Tobacco use: Secondary | ICD-10-CM | POA: Diagnosis not present

## 2015-04-15 DIAGNOSIS — Z981 Arthrodesis status: Secondary | ICD-10-CM | POA: Diagnosis not present

## 2015-04-17 DIAGNOSIS — M5136 Other intervertebral disc degeneration, lumbar region: Secondary | ICD-10-CM | POA: Diagnosis not present

## 2015-04-17 DIAGNOSIS — Z4789 Encounter for other orthopedic aftercare: Secondary | ICD-10-CM | POA: Diagnosis not present

## 2015-04-17 DIAGNOSIS — Z720 Tobacco use: Secondary | ICD-10-CM | POA: Diagnosis not present

## 2015-04-17 DIAGNOSIS — Z981 Arthrodesis status: Secondary | ICD-10-CM | POA: Diagnosis not present

## 2015-04-17 DIAGNOSIS — J45909 Unspecified asthma, uncomplicated: Secondary | ICD-10-CM | POA: Diagnosis not present

## 2015-04-21 DIAGNOSIS — Z4789 Encounter for other orthopedic aftercare: Secondary | ICD-10-CM | POA: Diagnosis not present

## 2015-04-21 DIAGNOSIS — M5136 Other intervertebral disc degeneration, lumbar region: Secondary | ICD-10-CM | POA: Diagnosis not present

## 2015-04-21 DIAGNOSIS — J45909 Unspecified asthma, uncomplicated: Secondary | ICD-10-CM | POA: Diagnosis not present

## 2015-04-21 DIAGNOSIS — Z720 Tobacco use: Secondary | ICD-10-CM | POA: Diagnosis not present

## 2015-04-21 DIAGNOSIS — Z981 Arthrodesis status: Secondary | ICD-10-CM | POA: Diagnosis not present

## 2015-04-23 ENCOUNTER — Emergency Department (HOSPITAL_COMMUNITY): Payer: Medicare Other

## 2015-04-23 ENCOUNTER — Encounter (HOSPITAL_COMMUNITY): Payer: Self-pay | Admitting: Emergency Medicine

## 2015-04-23 ENCOUNTER — Emergency Department (HOSPITAL_COMMUNITY)
Admission: EM | Admit: 2015-04-23 | Discharge: 2015-04-23 | Disposition: A | Discharge status: Home / Self Care | Payer: Medicare Other | Attending: Emergency Medicine | Admitting: Emergency Medicine

## 2015-04-23 DIAGNOSIS — W1839XA Other fall on same level, initial encounter: Secondary | ICD-10-CM | POA: Insufficient documentation

## 2015-04-23 DIAGNOSIS — Y929 Unspecified place or not applicable: Secondary | ICD-10-CM | POA: Diagnosis not present

## 2015-04-23 DIAGNOSIS — Z872 Personal history of diseases of the skin and subcutaneous tissue: Secondary | ICD-10-CM | POA: Insufficient documentation

## 2015-04-23 DIAGNOSIS — S3992XA Unspecified injury of lower back, initial encounter: Secondary | ICD-10-CM | POA: Insufficient documentation

## 2015-04-23 DIAGNOSIS — M79604 Pain in right leg: Secondary | ICD-10-CM

## 2015-04-23 DIAGNOSIS — Z87448 Personal history of other diseases of urinary system: Secondary | ICD-10-CM | POA: Insufficient documentation

## 2015-04-23 DIAGNOSIS — M5136 Other intervertebral disc degeneration, lumbar region: Secondary | ICD-10-CM | POA: Insufficient documentation

## 2015-04-23 DIAGNOSIS — J45909 Unspecified asthma, uncomplicated: Secondary | ICD-10-CM | POA: Insufficient documentation

## 2015-04-23 DIAGNOSIS — Z87442 Personal history of urinary calculi: Secondary | ICD-10-CM | POA: Insufficient documentation

## 2015-04-23 DIAGNOSIS — G40909 Epilepsy, unspecified, not intractable, without status epilepticus: Secondary | ICD-10-CM | POA: Insufficient documentation

## 2015-04-23 DIAGNOSIS — Z4789 Encounter for other orthopedic aftercare: Secondary | ICD-10-CM | POA: Diagnosis not present

## 2015-04-23 DIAGNOSIS — S0990XA Unspecified injury of head, initial encounter: Secondary | ICD-10-CM | POA: Insufficient documentation

## 2015-04-23 DIAGNOSIS — R9431 Abnormal electrocardiogram [ECG] [EKG]: Secondary | ICD-10-CM | POA: Diagnosis not present

## 2015-04-23 DIAGNOSIS — R011 Cardiac murmur, unspecified: Secondary | ICD-10-CM | POA: Diagnosis not present

## 2015-04-23 DIAGNOSIS — G43909 Migraine, unspecified, not intractable, without status migrainosus: Secondary | ICD-10-CM | POA: Insufficient documentation

## 2015-04-23 DIAGNOSIS — Z720 Tobacco use: Secondary | ICD-10-CM | POA: Diagnosis not present

## 2015-04-23 DIAGNOSIS — W19XXXA Unspecified fall, initial encounter: Secondary | ICD-10-CM

## 2015-04-23 DIAGNOSIS — Z981 Arthrodesis status: Secondary | ICD-10-CM | POA: Diagnosis not present

## 2015-04-23 DIAGNOSIS — Y999 Unspecified external cause status: Secondary | ICD-10-CM | POA: Diagnosis not present

## 2015-04-23 DIAGNOSIS — Z7952 Long term (current) use of systemic steroids: Secondary | ICD-10-CM | POA: Diagnosis not present

## 2015-04-23 DIAGNOSIS — Z79899 Other long term (current) drug therapy: Secondary | ICD-10-CM | POA: Diagnosis not present

## 2015-04-23 DIAGNOSIS — M549 Dorsalgia, unspecified: Secondary | ICD-10-CM | POA: Diagnosis not present

## 2015-04-23 DIAGNOSIS — R531 Weakness: Secondary | ICD-10-CM | POA: Diagnosis not present

## 2015-04-23 DIAGNOSIS — Y939 Activity, unspecified: Secondary | ICD-10-CM | POA: Insufficient documentation

## 2015-04-23 DIAGNOSIS — M4326 Fusion of spine, lumbar region: Secondary | ICD-10-CM | POA: Diagnosis not present

## 2015-04-23 LAB — CBC WITH DIFFERENTIAL/PLATELET
Basophils Absolute: 0 10*3/uL (ref 0.0–0.1)
Basophils Relative: 0 %
EOS PCT: 4 %
Eosinophils Absolute: 0.4 10*3/uL (ref 0.0–0.7)
HCT: 35.8 % — ABNORMAL LOW (ref 36.0–46.0)
Hemoglobin: 11.9 g/dL — ABNORMAL LOW (ref 12.0–15.0)
LYMPHS ABS: 2.4 10*3/uL (ref 0.7–4.0)
LYMPHS PCT: 26 %
MCH: 31.5 pg (ref 26.0–34.0)
MCHC: 33.2 g/dL (ref 30.0–36.0)
MCV: 94.7 fL (ref 78.0–100.0)
MONOS PCT: 6 %
Monocytes Absolute: 0.6 10*3/uL (ref 0.1–1.0)
Neutro Abs: 6 10*3/uL (ref 1.7–7.7)
Neutrophils Relative %: 64 %
PLATELETS: 210 10*3/uL (ref 150–400)
RBC: 3.78 MIL/uL — AB (ref 3.87–5.11)
RDW: 12.7 % (ref 11.5–15.5)
WBC: 9.4 10*3/uL (ref 4.0–10.5)

## 2015-04-23 LAB — BASIC METABOLIC PANEL
Anion gap: 10 (ref 5–15)
BUN: 6 mg/dL (ref 6–20)
CHLORIDE: 108 mmol/L (ref 101–111)
CO2: 19 mmol/L — ABNORMAL LOW (ref 22–32)
Calcium: 9.2 mg/dL (ref 8.9–10.3)
Creatinine, Ser: 0.8 mg/dL (ref 0.44–1.00)
GFR calc Af Amer: >60 mL/min (ref >60)
GLUCOSE: 89 mg/dL (ref 65–99)
POTASSIUM: 3.9 mmol/L (ref 3.5–5.1)
Sodium: 137 mmol/L (ref 135–145)

## 2015-04-23 MED ORDER — LORAZEPAM 2 MG/ML IJ SOLN
INTRAMUSCULAR | Status: AC
  Administered 2015-04-23: 1 mg via INTRAVENOUS
  Filled 2015-04-23: qty 1

## 2015-04-23 MED ORDER — FENTANYL CITRATE (PF) 100 MCG/2ML IJ SOLN
25.0000 ug | Freq: Once | INTRAMUSCULAR | Status: AC
  Administered 2015-04-23: 25 ug via INTRAVENOUS
  Filled 2015-04-23: qty 2

## 2015-04-23 MED ORDER — LORAZEPAM 2 MG/ML IJ SOLN
1.0000 mg | Freq: Once | INTRAMUSCULAR | Status: AC
  Administered 2015-04-23: 1 mg via INTRAVENOUS

## 2015-04-23 MED ORDER — SODIUM CHLORIDE 0.9 % IV BOLUS (SEPSIS)
1000.0000 mL | Freq: Once | INTRAVENOUS | Status: AC
  Administered 2015-04-23: 1000 mL via INTRAVENOUS

## 2015-04-23 MED ORDER — GADOBENATE DIMEGLUMINE 529 MG/ML IV SOLN
19.0000 mL | Freq: Once | INTRAVENOUS | Status: AC | PRN
  Administered 2015-04-23: 19 mL via INTRAVENOUS

## 2015-04-23 NOTE — ED Notes (Signed)
From home via [facility], fell this AM (3rd fall in 2 days) S/P lumbar fusion, VSS, CBG 93, has marked weakness and numbness in RLE, sts this is unchanged today - strong DP pulse  4 mg Zofran 75 mcg Fentanyl

## 2015-04-23 NOTE — ED Notes (Signed)
Pt returns from MRI tearful and states 10/10 pain to leg, head, and back. MD notified.

## 2015-04-23 NOTE — Discharge Instructions (Signed)
Degenerative Disk Disease  Degenerative disk disease is a condition caused by the changes that occur in spinal disks as you grow older. Spinal disks are soft and compressible disks located between the bones of your spine (vertebrae). These disks act like shock absorbers. Degenerative disk disease can affect the whole spine. However, the neck and lower back are most commonly affected. Many changes can occur in the spinal disks with aging, such as:  · The spinal disks may dry and shrink.  · Small tears may occur in the tough, outer covering of the disk (annulus).  · The disk space may become smaller due to loss of water.  · Abnormal growths in the bone (spurs) may occur. This can put pressure on the nerve roots exiting the spinal canal, causing pain.  · The spinal canal may become narrowed.  RISK FACTORS   · Being overweight.  · Having a family history of degenerative disk disease.  · Smoking.  · There is increased risk if you are doing heavy lifting or have a sudden injury.  SIGNS AND SYMPTOMS   Symptoms vary from person to person and may include:  · Pain that varies in intensity. Some people have no pain, while others have severe pain. The location of the pain depends on the part of your backbone that is affected.  ¨ You will have neck or arm pain if a disk in the neck area is affected.  ¨ You will have pain in your back, buttocks, or legs if a disk in the lower back is affected.  · Pain that becomes worse while bending, reaching up, or with twisting movements.  · Pain that may start gradually and then get worse as time passes. It may also start after a major or minor injury.  · Numbness or tingling in the arms or legs.  DIAGNOSIS   Your health care provider will ask you about your symptoms and about activities or habits that may cause the pain. He or she may also ask about any injuries, diseases, or treatments you have had. Your health care provider will examine you to check for the range of movement that is  possible in the affected area, to check for strength in your extremities, and to check for sensation in the areas of the arms and legs supplied by different nerve roots. You may also have:   · An X-ray of the spine.  · Other imaging tests, such as MRI.  TREATMENT   Your health care provider will advise you on the best plan for treatment. Treatment may include:  · Medicines.  · Rehabilitation exercises.  HOME CARE INSTRUCTIONS   · Follow proper lifting and walking techniques as advised by your health care provider.  · Maintain good posture.  · Exercise regularly as advised by your health care provider.  · Perform relaxation exercises.  · Change your sitting, standing, and sleeping habits as advised by your health care provider.  · Change positions frequently.  · Lose weight or maintain a healthy weight as advised by your health care provider.  · Do not use any tobacco products, including cigarettes, chewing tobacco, or electronic cigarettes. If you need help quitting, ask your health care provider.  · Wear supportive footwear.  · Take medicines only as directed by your health care provider.  SEEK MEDICAL CARE IF:   · Your pain does not go away within 1-4 weeks.  · You have significant appetite or weight loss.  SEEK IMMEDIATE MEDICAL CARE IF:   ·   Your pain is severe.  · You notice weakness in your arms, hands, or legs.  · You begin to lose control of your bladder or bowel movements.  · You have fevers or night sweats.  MAKE SURE YOU:   · Understand these instructions.  · Will watch your condition.  · Will get help right away if you are not doing well or get worse.     This information is not intended to replace advice given to you by your health care provider. Make sure you discuss any questions you have with your health care provider.     Document Released: 04/18/2007 Document Revised: 07/12/2014 Document Reviewed: 10/23/2013  Elsevier Interactive Patient Education ©2016 [employer] Inc[employer]

## 2015-04-23 NOTE — ED Provider Notes (Signed)
Patient presents after fall. Recent back surgery on 03/25/15. Complains of right lower extremity numbness and weakness. No bowel or bladder incontinence. She was drowsy due to fentanyl. No concerns for sepsis. No fevers, no leukocytosis.   Patient's MRI is unremarkable for any acute changes. She has a small amount of subcutaneous fluid collection but doubted to be inflammatory or infectious in etiology. Neurosurgery was contacted and will have follow-up with Dr. Jordan LikesPool but do not believe this to be infectious etiology. Patient was in agreement with this plan and will be discharged home.   Deirdre PeerJeremiah Nolawi Kanady, MD 04/24/15 0030  Marily MemosJason Mesner, MD 04/24/15 [phone]

## 2015-04-23 NOTE — ED Provider Notes (Signed)
CSN: [id number]     Arrival date & time 04/23/15  1246 History   First MD Initiated Contact with Patient 04/23/15 1249     Chief Complaint  Patient presents with  . Fall  . Back Pain    HPI   Melissa [name] is a 39 y.o. female with a PMH of posterior lumbar fusion (03/25/15) who presents to the ED with fall and worsening back pain. Per report, this is the patient's 3rd fall in the past 2 days. She states she felt nauseous this morning, and tried to sit down, but fell. She reports she hit her head, but did not lose consciousness. She states her physical therapist called EMS. She reports worsening low back pain. She denies fever, chills. She reports headache. She denies lightheadedness, dizziness, vision changes, chest pain, shortness of breath, abdominal pain, vomiting, diarrhea, constipation. She reports weakness and numbness to her right lower extremity, which she states she experiences intermittently. She denies saddle anesthesia, bowel or bladder incontinence.     Past Medical History  Diagnosis Date  . Hidradenitis 2007  . Kidney infection   . Heart murmur     after child was born - has never had any problems  . Asthma     as a young adult - not for many years  . Depression   . Kidney stones   . Seizures (HCC)     as a baby   . Headache     migraines  . Anemia   . Family history of adverse reaction to anesthesia    Past Surgical History  Procedure Laterality Date  . Appendectomy  1997  . Knee surgery Left 1997  . Axillary hidradenitis excision Bilateral 2007  . Inguinal hidradenitis excision  06-18-14    left  . Kidney stone surgery    . Tubal ligation    . Cesarean section    . Posterior lumbar fusion N/A 03/25/2015    Procedure: POSTERIOR LUMBAR INTERBODY FUSION LUMBAR FOUR-FIVE;  Surgeon: Julio SicksHenry Pool, MD;  Location[facility] OR;  Service: Neurosurgery;  Laterality: N/A;   Family History  Problem Relation Age of Onset  . Diabetes Mother   . Hypertension Mother    Social  History  Substance Use Topics  . Smoking status: Current Every Day Smoker -- 0.50 packs/day for 10 years    Types: Cigarettes  . Smokeless tobacco: Never Used  . Alcohol Use: 12.6 oz/week    21 Cans of beer per week   OB History    Gravida Para Term Preterm AB TAB SAB Ectopic Multiple Living   6 3   3  3   3       Obstetric Comments   1st Menstrual Cycle:  11 1st Pregnancy:  18      Review of Systems  Constitutional: Negative for fever and chills.  Eyes: Negative for visual disturbance.  Respiratory: Negative for shortness of breath.   Cardiovascular: Negative for chest pain.  Gastrointestinal: Positive for nausea. Negative for vomiting, abdominal pain, diarrhea and constipation.  Musculoskeletal: Positive for back pain.  Neurological: Positive for weakness and numbness. Negative for dizziness, syncope, light-headedness and headaches.  All other systems reviewed and are negative.     Allergies  Tylenol; Celebrex; and Other  Home Medications   Prior to Admission medications   Medication Sig Start Date End Date Taking? Authorizing Provider  diazepam (VALIUM) 5 MG tablet Take 1-2 tablets (5-10 mg total) by mouth every 6 (six) hours as needed for  muscle spasms. 03/27/15   Julio Sicks, MD  gabapentin (NEURONTIN) 100 MG capsule Take 100 mg by mouth 3 (three) times daily. 02/14/15   Historical Provider, MD  HYDROcodone-acetaminophen (NORCO/VICODIN) 5-325 MG per tablet Take 1 tablet by mouth every 6 (six) hours as needed (pain).  03/12/15   Historical Provider, MD  Menthol, Topical Analgesic, (ICY HOT EX) Apply 1 application topically every 2 (two) hours as needed (pain).    Historical Provider, MD  oxyCODONE-acetaminophen (PERCOCET) 10-325 MG per tablet Take 1-2 tablets by mouth every 4 (four) hours as needed for pain. 03/27/15   Julio Sicks, MD  predniSONE (DELTASONE) 20 MG tablet Take 2 tablets (40 mg total) by mouth daily. 04/02/15   Minna Antis, MD  traMADol (ULTRAM) 50 MG  tablet Take 50 mg by mouth 2 (two) times daily as needed (pain).  01/17/15   Historical Provider, MD    BP 111/63 mmHg  Pulse 75  Resp 18  Ht  (1.651 m)  Wt 200 lb (90.719 kg)  BMI 33.28 kg/m2  SpO2 99%  LMP 03/30/2015 (Exact Date) Physical Exam  Constitutional: She is oriented to person, place, and time. No distress.  Drowsy female in no acute distress.  HENT:  Head: Normocephalic and atraumatic.  Right Ear: External ear normal.  Left Ear: External ear normal.  Nose: Nose normal.  Mouth/Throat: Oropharynx is clear and moist.  Mild TTP over left lateral forehead. No ecchymosis, hematoma, or palpable deformity.  Eyes: Conjunctivae and EOM are normal. Pupils are equal, round, and reactive to light.  Neck: Normal range of motion. Neck supple.  Cardiovascular: Normal rate, regular rhythm, normal heart sounds and intact distal pulses.   Pulmonary/Chest: Effort normal and breath sounds normal. No respiratory distress. She has no wheezes. She has no rales. She exhibits no tenderness.  Abdominal: Soft. Bowel sounds are normal. She exhibits no distension and no mass. There is no tenderness. There is no rebound and no guarding.  Genitourinary: Rectal exam shows anal tone normal.  Musculoskeletal: She exhibits tenderness.  TTP of lumbar spine and right lumbar paraspinal muscles. No step-off or deformity.  Neurological: She is oriented to person, place, and time. No cranial nerve deficit.  Strength and sensation intact in upper extremities bilaterally and left lower extremity. Patient does not move right lower extremity and reports sensory deficit to light touch.  Skin: Skin is warm and dry. No rash noted. She is not diaphoretic. No erythema. No pallor.  Psychiatric: She has a normal mood and affect. Her behavior is normal.  Nursing note and vitals reviewed.   ED Course  Procedures (including critical care time)  Labs Review Labs Reviewed  CBC WITH DIFFERENTIAL/PLATELET - Abnormal;  Notable for the following:    RBC 3.78 (*)    Hemoglobin 11.9 (*)    HCT 35.8 (*)    All other components within normal limits  BASIC METABOLIC PANEL - Abnormal; Notable for the following:    CO2 19 (*)    All other components within normal limits    Imaging Review No results found.   I have personally reviewed and evaluated these images and lab results as part of my medical decision-making.   EKG Interpretation   Date/Time:  Wednesday April 23 2015 12:50:39 EDT Ventricular Rate:  79 PR Interval:  167 QRS Duration: 99 QT Interval:  378 QTC Calculation: 433 R Axis:   21 Text Interpretation:  Sinus rhythm Low voltage, precordial leads RSR' in  V1 or V2, right  VCD or RVH ST elevation, consider inferior injury Baseline  wander in lead(s) II III aVR aVL aVF V1 V2 V5 poor tracing. not able to  assess inferior leads. Confirmed by [facility] MD, Barbara Cower [phone]) on 04/23/2015  4:07:31 PM      MDM   Final diagnoses:  Fall    39 year old female presents with fall and low back pain. States she hit her head. Denies LOC. Reports headache. Denies lightheadedness, dizziness, vision changes, chest pain, shortness of breath, abdominal pain, vomiting, diarrhea, constipation, fever, chills. Reports weakness and numbness to her right lower extremity, which she states she experiences intermittently. Denies saddle anesthesia, bowel or bladder incontinence.   Patient is afebrile. Vital signs stable. Mild TTP of left lateral forehead. No ecchymosis, hematoma, or palpable deformity. Heart RRR. Lungs clear to auscultation bilaterally. Abdomen soft, non-tender, non-distended. TTP of lumbar spine and right lumbar paraspinal muscles. No step-off or deformity. Strength and sensation intact in upper extremities bilaterally and left lower extremity. Patient does not move right lower extremity and reports sensory deficit to light touch. Normal rectal sphincter tone on DRE.  CBC negative for leukocytosis,  hemoglobin 11.9. BMP with normal creatinine. Will obtain head CT and lumbar spine MRI. Given 1 L NS in the ED given systolic BP in 90s.  Patient signed out to Dr. Ladona Ridgel at shift change; head CT and lumbar spine MRI pending.  With normal imaging and stable vitals, feel patient is stable for discharge with PCP follow-up.  BP 97/62 mmHg  Pulse 73  Resp 18  Ht  (1.651 m)  Wt 200 lb (90.719 kg)  BMI 33.28 kg/m2  SpO2 99%  LMP 03/30/2015 (Exact Date)        Mady Gemma, PA-C 04/23/15 1947  Marily Memos, MD 04/24/15 [phone]

## 2015-04-23 NOTE — ED Notes (Signed)
Patient transported to MRI 

## 2015-04-23 NOTE — ED Notes (Signed)
Pt family requesting to get food for pt at this time, pt states she wants subway, RN informed pt at this time no food until MRI results come back. Pt and family verbalized understanding. RN informed family food could be brought back and pt can eat once ok from MD.

## 2015-04-28 DIAGNOSIS — Z4789 Encounter for other orthopedic aftercare: Secondary | ICD-10-CM | POA: Diagnosis not present

## 2015-04-28 DIAGNOSIS — J45909 Unspecified asthma, uncomplicated: Secondary | ICD-10-CM | POA: Diagnosis not present

## 2015-04-28 DIAGNOSIS — Z981 Arthrodesis status: Secondary | ICD-10-CM | POA: Diagnosis not present

## 2015-04-28 DIAGNOSIS — M5136 Other intervertebral disc degeneration, lumbar region: Secondary | ICD-10-CM | POA: Diagnosis not present

## 2015-04-28 DIAGNOSIS — Z720 Tobacco use: Secondary | ICD-10-CM | POA: Diagnosis not present

## 2015-04-30 DIAGNOSIS — J45909 Unspecified asthma, uncomplicated: Secondary | ICD-10-CM | POA: Diagnosis not present

## 2015-04-30 DIAGNOSIS — M5136 Other intervertebral disc degeneration, lumbar region: Secondary | ICD-10-CM | POA: Diagnosis not present

## 2015-04-30 DIAGNOSIS — Z981 Arthrodesis status: Secondary | ICD-10-CM | POA: Diagnosis not present

## 2015-04-30 DIAGNOSIS — Z4789 Encounter for other orthopedic aftercare: Secondary | ICD-10-CM | POA: Diagnosis not present

## 2015-04-30 DIAGNOSIS — Z720 Tobacco use: Secondary | ICD-10-CM | POA: Diagnosis not present

## 2015-06-03 DIAGNOSIS — Z113 Encounter for screening for infections with a predominantly sexual mode of transmission: Secondary | ICD-10-CM | POA: Diagnosis not present

## 2015-06-03 DIAGNOSIS — Z1151 Encounter for screening for human papillomavirus (HPV): Secondary | ICD-10-CM | POA: Diagnosis not present

## 2015-06-03 DIAGNOSIS — Z01419 Encounter for gynecological examination (general) (routine) without abnormal findings: Secondary | ICD-10-CM | POA: Diagnosis not present

## 2015-06-03 DIAGNOSIS — Z202 Contact with and (suspected) exposure to infections with a predominantly sexual mode of transmission: Secondary | ICD-10-CM | POA: Diagnosis not present

## 2015-06-06 ENCOUNTER — Other Ambulatory Visit: Payer: Self-pay | Admitting: Neurosurgery

## 2015-06-06 DIAGNOSIS — M5136 Other intervertebral disc degeneration, lumbar region: Secondary | ICD-10-CM

## 2015-07-18 ENCOUNTER — Ambulatory Visit
Admission: RE | Admit: 2015-07-18 | Discharge: 2015-07-18 | Disposition: A | Discharge status: Home / Self Care | Payer: Medicare Other | Source: Ambulatory Visit | Attending: Neurosurgery | Admitting: Neurosurgery

## 2015-07-18 DIAGNOSIS — Z9889 Other specified postprocedural states: Secondary | ICD-10-CM | POA: Insufficient documentation

## 2015-07-18 DIAGNOSIS — M5136 Other intervertebral disc degeneration, lumbar region: Secondary | ICD-10-CM | POA: Diagnosis not present

## 2015-07-18 DIAGNOSIS — M545 Low back pain: Secondary | ICD-10-CM | POA: Diagnosis not present

## 2015-07-29 DIAGNOSIS — M25561 Pain in right knee: Secondary | ICD-10-CM | POA: Diagnosis not present

## 2015-07-29 DIAGNOSIS — M545 Low back pain: Secondary | ICD-10-CM | POA: Diagnosis not present

## 2015-07-29 DIAGNOSIS — G8911 Acute pain due to trauma: Secondary | ICD-10-CM | POA: Diagnosis not present

## 2015-07-29 DIAGNOSIS — S93439A Sprain of tibiofibular ligament of unspecified ankle, initial encounter: Secondary | ICD-10-CM | POA: Diagnosis not present

## 2015-07-29 DIAGNOSIS — Z87411 Personal history of vaginal dysplasia: Secondary | ICD-10-CM | POA: Diagnosis not present

## 2015-08-05 DIAGNOSIS — M5136 Other intervertebral disc degeneration, lumbar region: Secondary | ICD-10-CM | POA: Diagnosis not present

## 2015-08-05 DIAGNOSIS — Z87891 Personal history of nicotine dependence: Secondary | ICD-10-CM | POA: Diagnosis not present

## 2015-08-05 DIAGNOSIS — M545 Low back pain: Secondary | ICD-10-CM | POA: Diagnosis not present

## 2015-08-05 DIAGNOSIS — M549 Dorsalgia, unspecified: Secondary | ICD-10-CM | POA: Diagnosis not present

## 2015-08-20 DIAGNOSIS — M549 Dorsalgia, unspecified: Secondary | ICD-10-CM | POA: Diagnosis not present

## 2015-08-20 DIAGNOSIS — M5126 Other intervertebral disc displacement, lumbar region: Secondary | ICD-10-CM | POA: Diagnosis not present

## 2015-08-20 DIAGNOSIS — G8929 Other chronic pain: Secondary | ICD-10-CM | POA: Diagnosis not present

## 2015-08-20 DIAGNOSIS — M545 Low back pain: Secondary | ICD-10-CM | POA: Diagnosis not present

## 2015-08-24 ENCOUNTER — Emergency Department
Admission: EM | Admit: 2015-08-24 | Discharge: 2015-08-24 | Disposition: A | Discharge status: Home / Self Care | Payer: Medicare Other | Attending: Emergency Medicine | Admitting: Emergency Medicine

## 2015-08-24 DIAGNOSIS — M25462 Effusion, left knee: Secondary | ICD-10-CM

## 2015-08-24 DIAGNOSIS — F17210 Nicotine dependence, cigarettes, uncomplicated: Secondary | ICD-10-CM | POA: Insufficient documentation

## 2015-08-24 DIAGNOSIS — Z79899 Other long term (current) drug therapy: Secondary | ICD-10-CM | POA: Diagnosis not present

## 2015-08-24 DIAGNOSIS — Z9889 Other specified postprocedural states: Secondary | ICD-10-CM | POA: Diagnosis not present

## 2015-08-24 DIAGNOSIS — M7121 Synovial cyst of popliteal space [Baker], right knee: Secondary | ICD-10-CM | POA: Diagnosis not present

## 2015-08-24 DIAGNOSIS — G8929 Other chronic pain: Secondary | ICD-10-CM | POA: Insufficient documentation

## 2015-08-24 DIAGNOSIS — M25562 Pain in left knee: Secondary | ICD-10-CM | POA: Diagnosis present

## 2015-08-24 MED ORDER — OXYCODONE HCL 5 MG PO TABS: 5.0000 mg | ORAL_TABLET | Freq: Three times a day (TID) | ORAL | Status: AC | PRN

## 2015-08-24 NOTE — ED Provider Notes (Signed)
CSN: [id number]     Arrival date & time 08/24/15  1106 History   First MD Initiated Contact with Patient 08/24/15 1323     Chief Complaint  Patient presents with  . Knee Pain     (Consider location/radiation/quality/duration/timing/severity/associated sxs/prior Treatment) HPI  40 year old female presents to emergency department for evaluation of left knee pain. Patient has had overall one month of left knee pain. 3-4 days ago she saw her primary care physician and received a cortisone injection to the left knee. Patient received mild relief up until last night she developed swelling and tightness throughout the left knee. She denies any fevers, warmth, erythema, drainage. Patient's tightness is throughout the quads and posterior aspect the left knee. She is able to ambulate but with a limp. She has increased pain with trying to flex the knee. She denies any instability but does have catching and locking with walking, stairs and bending. She has not had any recent trauma or injury. She has had a history of prior left knee arthroscopy. Currently taking Mobic 15 mg daily for pain.  Past Medical History  Diagnosis Date  . Hidradenitis 2007  . Kidney infection   . Heart murmur     after child was born - has never had any problems  . Asthma     as a young adult - not for many years  . Depression   . Kidney stones   . Seizures (HCC)     as a baby   . Headache     migraines  . Anemia   . Family history of adverse reaction to anesthesia    Past Surgical History  Procedure Laterality Date  . Appendectomy  1997  . Knee surgery Left 1997  . Axillary hidradenitis excision Bilateral 2007  . Inguinal hidradenitis excision  06-18-14    left  . Kidney stone surgery    . Tubal ligation    . Cesarean section    . Posterior lumbar fusion N/A 03/25/2015    Procedure: POSTERIOR LUMBAR INTERBODY FUSION LUMBAR FOUR-FIVE;  Surgeon: Julio Sicks, MD;  Location: [facility] OR;  Service: Neurosurgery;  Laterality:  N/A;   Family History  Problem Relation Age of Onset  . Diabetes Mother   . Hypertension Mother    Social History  Substance Use Topics  . Smoking status: Current Every Day Smoker -- 0.50 packs/day for 10 years    Types: Cigarettes  . Smokeless tobacco: Never Used  . Alcohol Use: 12.6 oz/week    21 Cans of beer per week   OB History    Gravida Para Term Preterm AB TAB SAB Ectopic Multiple Living   Obstetric Comments   1st Menstrual Cycle:  11 1st Pregnancy:  18     Review of Systems  Constitutional: Negative for fever, chills, activity change and fatigue.  HENT: Negative for congestion, sinus pressure and sore throat.   Eyes: Negative for visual disturbance.  Respiratory: Negative for cough, chest tightness and shortness of breath.   Cardiovascular: Negative for chest pain and leg swelling.  Gastrointestinal: Negative for nausea, vomiting, abdominal pain and diarrhea.  Genitourinary: Negative for dysuria.  Musculoskeletal: Positive for joint swelling and arthralgias. Negative for gait problem.  Skin: Negative for rash.  Neurological: Negative for weakness, numbness and headaches.  Hematological: Negative for adenopathy.  Psychiatric/Behavioral: Negative for behavioral problems, confusion and agitation.      Allergies  Tylenol;  Ultram; Celebrex; and Other  Home Medications   Prior to Admission medications   Medication Sig Start Date End Date Taking? Authorizing Provider  diazepam (VALIUM) 5 MG tablet Take 5 mg by mouth every 6 (six) hours as needed. 04/11/15   Historical Provider, MD  gabapentin (NEURONTIN) 100 MG capsule Take 100 mg by mouth 3 (three) times daily. 02/14/15   Historical Provider, MD  HYDROcodone-acetaminophen (NORCO/VICODIN) 5-325 MG per tablet Take 1 tablet by mouth every 6 (six) hours as needed (pain).  03/12/15   Historical Provider, MD  Menthol, Topical Analgesic, (ICY HOT EX) Apply 1 application topically every 2 (two) hours as  needed (pain).    Historical Provider, MD  oxyCODONE (ROXICODONE) 5 MG immediate release tablet Take 1 tablet (5 mg total) by mouth every 8 (eight) hours as needed. 08/24/15 08/23/16  Evon Slack, PA-C  traMADol (ULTRAM) 50 MG tablet Take 50 mg by mouth 2 (two) times daily as needed (pain).  01/17/15   Historical Provider, MD   BP 110/67 mmHg  Pulse 74  Temp(Src) 98 F (36.7 C) (Oral)  Resp 18  Ht  (1.626 m)  Wt 90.719 kg  BMI 34.31 kg/m2  SpO2 100%  LMP 08/05/2015 Physical Exam  Constitutional: She is oriented to person, place, and time. She appears well-developed and well-nourished. No distress.  HENT:  Head: Normocephalic and atraumatic.  Mouth/Throat: Oropharynx is clear and moist.  Eyes: EOM are normal. Pupils are equal, round, and reactive to light. Right eye exhibits no discharge. Left eye exhibits no discharge.  Neck: Normal range of motion. Neck supple.  Cardiovascular: Normal rate and intact distal pulses.   Pulmonary/Chest: Effort normal and breath sounds normal. No respiratory distress.  Musculoskeletal:  Examination of the left lower leg shows patient has full range of motion of the hip and ankle. There is a moderate knee effusion with limited left knee range of motion. There is no instability. There is no warmth erythema. No edema or calf pain. Patient has a positive Baker cyst with no tenderness to palpation. Positive McMurray's test.  Neurological: She is alert and oriented to person, place, and time. She has normal reflexes.  Skin: Skin is warm and dry.  Psychiatric: She has a normal mood and affect. Her behavior is normal. Thought content normal.    ED Course  Procedures (including critical care time) Labs Review Labs Reviewed - No data to display  Imaging Review No results found. I have personally reviewed and evaluated these images and lab results as part of my medical decision-making.   EKG Interpretation None      MDM   Final diagnoses:  Knee  effusion, left    40 year old female with acute on chronic left knee pain. Patient's history and exam consistent with likely meniscal tear. She has had recent cortisone injection that only gave her a few days' worth relief. She is given crutches, knee immobilizer. She will follow-up with orthopedics in 3-4 days. We discussed concern for possible meniscus injury. I do not feel as if patient is suffering from a septic joint at this time. She'll continue meloxicam for pain. Oxycodone 5 mg 1 tab by mouth every 8 hours when necessary severe pain quantity #10 was 0 refills.    Evon Slack, PA-C 08/24/15 1337  Jennye Moccasin, MD 08/24/15 1346

## 2015-08-24 NOTE — Discharge Instructions (Signed)
Knee Effusion °Knee effusion means that you have extra fluid in your knee. This can cause pain. Your knee may be more difficult to bend and move. °HOME CARE °· Use crutches as told by your doctor. °· Wear a knee brace as told by your doctor. °· Apply ice to the swollen area: °¨ Put ice in a plastic bag. °¨ Place a towel between your skin and the bag. °¨ Leave the ice on for 20 minutes, 2-3 times per day. °· Keep your knee raised (elevated) when you are sitting or lying down. °· Take medicines only as told by your doctor. °· Do any rehabilitation or strengthening exercises as told by your doctor. °· Rest your knee as told by your doctor. You may start doing your normal activities again when your doctor says it is okay. °· Keep all follow-up visits as told by your doctor. This is important. °GET HELP IF:  °· You continue to have pain in your knee. °GET HELP RIGHT AWAY IF: °· You have increased swelling or redness of your knee. °· You have severe pain in your knee. °· You have a fever. °  °This information is not intended to replace advice given to you by your health care provider. Make sure you discuss any questions you have with your health care provider. °  °Document Released: 07/24/2010 Document Revised: 07/12/2014 Document Reviewed: 02/04/2014 °Elsevier Interactive Patient Education ©2016 Else[employer] Inc[employer] ° °

## 2015-08-24 NOTE — ED Notes (Signed)
Pt arrives to ER c/o left knee pain after receiving cortisone shot X 2 days ago by PCP. No redness noted at this time. Color WNL. Painful to touch. Pt alert and oriented X4, active, cooperative, pt in NAD. RR even and unlabored, color WNL.

## 2015-08-27 DIAGNOSIS — M1712 Unilateral primary osteoarthritis, left knee: Secondary | ICD-10-CM | POA: Diagnosis not present

## 2015-08-29 DIAGNOSIS — M5136 Other intervertebral disc degeneration, lumbar region: Secondary | ICD-10-CM | POA: Diagnosis not present

## 2015-08-29 DIAGNOSIS — Z6835 Body mass index (BMI) 35.0-35.9, adult: Secondary | ICD-10-CM | POA: Diagnosis not present

## 2015-09-09 DIAGNOSIS — M1712 Unilateral primary osteoarthritis, left knee: Secondary | ICD-10-CM | POA: Diagnosis not present

## 2015-09-25 ENCOUNTER — Other Ambulatory Visit: Payer: Self-pay | Admitting: Specialist

## 2015-09-25 DIAGNOSIS — M1712 Unilateral primary osteoarthritis, left knee: Secondary | ICD-10-CM

## 2015-10-17 ENCOUNTER — Ambulatory Visit
Admission: RE | Admit: 2015-10-17 | Discharge: 2015-10-17 | Disposition: A | Discharge status: Home / Self Care | Payer: Medicare Other | Source: Ambulatory Visit | Attending: Specialist | Admitting: Specialist

## 2015-10-17 DIAGNOSIS — M25462 Effusion, left knee: Secondary | ICD-10-CM | POA: Insufficient documentation

## 2015-10-17 DIAGNOSIS — M1712 Unilateral primary osteoarthritis, left knee: Secondary | ICD-10-CM | POA: Insufficient documentation

## 2015-10-17 DIAGNOSIS — M25562 Pain in left knee: Secondary | ICD-10-CM | POA: Diagnosis not present

## 2015-10-17 DIAGNOSIS — M2342 Loose body in knee, left knee: Secondary | ICD-10-CM | POA: Diagnosis not present

## 2015-10-17 DIAGNOSIS — M94262 Chondromalacia, left knee: Secondary | ICD-10-CM | POA: Insufficient documentation

## 2015-10-21 DIAGNOSIS — M25562 Pain in left knee: Secondary | ICD-10-CM | POA: Diagnosis not present

## 2015-10-21 DIAGNOSIS — M1712 Unilateral primary osteoarthritis, left knee: Secondary | ICD-10-CM | POA: Diagnosis not present

## 2015-10-30 DIAGNOSIS — M5136 Other intervertebral disc degeneration, lumbar region: Secondary | ICD-10-CM | POA: Diagnosis not present

## 2015-11-18 DIAGNOSIS — M1712 Unilateral primary osteoarthritis, left knee: Secondary | ICD-10-CM | POA: Diagnosis not present

## 2015-11-18 DIAGNOSIS — M25562 Pain in left knee: Secondary | ICD-10-CM | POA: Diagnosis not present

## 2015-12-03 ENCOUNTER — Emergency Department
Admission: EM | Admit: 2015-12-03 | Discharge: 2015-12-03 | Disposition: A | Discharge status: Home / Self Care | Payer: Medicare Other | Attending: Emergency Medicine | Admitting: Emergency Medicine

## 2015-12-03 ENCOUNTER — Encounter: Payer: Self-pay | Admitting: Emergency Medicine

## 2015-12-03 ENCOUNTER — Emergency Department: Payer: Medicare Other

## 2015-12-03 DIAGNOSIS — M549 Dorsalgia, unspecified: Secondary | ICD-10-CM | POA: Diagnosis not present

## 2015-12-03 DIAGNOSIS — G8929 Other chronic pain: Secondary | ICD-10-CM | POA: Diagnosis not present

## 2015-12-03 DIAGNOSIS — F329 Major depressive disorder, single episode, unspecified: Secondary | ICD-10-CM | POA: Diagnosis not present

## 2015-12-03 DIAGNOSIS — M5136 Other intervertebral disc degeneration, lumbar region: Secondary | ICD-10-CM | POA: Insufficient documentation

## 2015-12-03 DIAGNOSIS — F410 Panic disorder [episodic paroxysmal anxiety] without agoraphobia: Secondary | ICD-10-CM | POA: Insufficient documentation

## 2015-12-03 DIAGNOSIS — J45909 Unspecified asthma, uncomplicated: Secondary | ICD-10-CM | POA: Insufficient documentation

## 2015-12-03 DIAGNOSIS — Z8669 Personal history of other diseases of the nervous system and sense organs: Secondary | ICD-10-CM | POA: Insufficient documentation

## 2015-12-03 DIAGNOSIS — F17210 Nicotine dependence, cigarettes, uncomplicated: Secondary | ICD-10-CM | POA: Insufficient documentation

## 2015-12-03 DIAGNOSIS — M545 Low back pain: Secondary | ICD-10-CM | POA: Diagnosis not present

## 2015-12-03 DIAGNOSIS — R079 Chest pain, unspecified: Secondary | ICD-10-CM | POA: Diagnosis not present

## 2015-12-03 LAB — COMPREHENSIVE METABOLIC PANEL
ALBUMIN: 3.8 g/dL (ref 3.5–5.0)
ALT: 10 U/L — ABNORMAL LOW (ref 14–54)
ANION GAP: 9 (ref 5–15)
AST: 18 U/L (ref 15–41)
Alkaline Phosphatase: 73 U/L (ref 38–126)
BUN: 7 mg/dL (ref 6–20)
CALCIUM: 9.2 mg/dL (ref 8.9–10.3)
CO2: 20 mmol/L — ABNORMAL LOW (ref 22–32)
CREATININE: 0.66 mg/dL (ref 0.44–1.00)
Chloride: 112 mmol/L — ABNORMAL HIGH (ref 101–111)
GFR calc Af Amer: >60 mL/min (ref >60)
GFR calc non Af Amer: >60 mL/min (ref >60)
GLUCOSE: 85 mg/dL (ref 65–99)
POTASSIUM: 3.7 mmol/L (ref 3.5–5.1)
SODIUM: 141 mmol/L (ref 135–145)
Total Bilirubin: 0.2 mg/dL — ABNORMAL LOW (ref 0.3–1.2)
Total Protein: 7.4 g/dL (ref 6.5–8.1)

## 2015-12-03 LAB — CBC WITH DIFFERENTIAL/PLATELET
BASOS ABS: 0.1 10*3/uL (ref 0–0.1)
EOS ABS: 0.3 10*3/uL (ref 0–0.7)
HEMATOCRIT: 35.8 % (ref 35.0–47.0)
Hemoglobin: 12.4 g/dL (ref 12.0–16.0)
Lymphocytes Relative: 27 %
Lymphs Abs: 3.1 10*3/uL (ref 1.0–3.6)
MCH: 32.6 pg (ref 26.0–34.0)
MCHC: 34.7 g/dL (ref 32.0–36.0)
MCV: 94 fL (ref 80.0–100.0)
MONO ABS: 0.7 10*3/uL (ref 0.2–0.9)
NEUTROS ABS: 7.3 10*3/uL — AB (ref 1.4–6.5)
Neutrophils Relative %: 63 %
PLATELETS: 246 10*3/uL (ref 150–440)
RBC: 3.81 MIL/uL (ref 3.80–5.20)
RDW: 12.6 % (ref 11.5–14.5)
WBC: 11.5 10*3/uL — ABNORMAL HIGH (ref 3.6–11.0)

## 2015-12-03 LAB — URINALYSIS COMPLETE WITH MICROSCOPIC (ARMC ONLY)
Bilirubin Urine: NEGATIVE
GLUCOSE, UA: NEGATIVE mg/dL
Ketones, ur: NEGATIVE mg/dL
Leukocytes, UA: NEGATIVE
NITRITE: NEGATIVE
Protein, ur: NEGATIVE mg/dL
SPECIFIC GRAVITY, URINE: 1.003 — AB (ref 1.005–1.030)
pH: 6 (ref 5.0–8.0)

## 2015-12-03 LAB — POCT PREGNANCY, URINE: PREG TEST UR: NEGATIVE

## 2015-12-03 MED ORDER — DIAZEPAM 5 MG/ML IJ SOLN
5.0000 mg | Freq: Once | INTRAMUSCULAR | Status: AC
  Administered 2015-12-03: 2.5 mg via INTRAVENOUS
  Filled 2015-12-03: qty 2

## 2015-12-03 MED ORDER — DIAZEPAM 5 MG PO TABS: 5.0000 mg | ORAL_TABLET | Freq: Three times a day (TID) | ORAL | Status: DC | PRN

## 2015-12-03 MED ORDER — DIAZEPAM 5 MG/ML IJ SOLN
5.0000 mg | Freq: Once | INTRAMUSCULAR | Status: AC
  Administered 2015-12-03: 5 mg via INTRAVENOUS

## 2015-12-03 MED ORDER — IBUPROFEN 800 MG PO TABS: 800.0000 mg | ORAL_TABLET | Freq: Three times a day (TID) | ORAL | Status: DC | PRN

## 2015-12-03 MED ORDER — KETOROLAC TROMETHAMINE 30 MG/ML IJ SOLN
30.0000 mg | Freq: Once | INTRAMUSCULAR | Status: AC
  Administered 2015-12-03: 30 mg via INTRAVENOUS
  Filled 2015-12-03: qty 1

## 2015-12-03 MED ORDER — LORAZEPAM 2 MG/ML IJ SOLN
1.0000 mg | Freq: Once | INTRAMUSCULAR | Status: AC
  Administered 2015-12-03: 1 mg via INTRAVENOUS
  Filled 2015-12-03 (×2): qty 1

## 2015-12-03 NOTE — ED Notes (Signed)
Pain has returned. Pt returns to being anxious, tearful with pain.

## 2015-12-03 NOTE — ED Notes (Signed)
Pt to ed via [facility] from home with reports of severe back pain, left arm pain and numbness, left leg pain and numbness with some chest pain. Pt with hx of back surgery last year with rods placed in her back. Pt had some blood in her emesis last week and was told to stop taking her tramadol, pt  Proceeded to stop taking all of meds and threw them in the trash. Pt was taking percocet, flexeril, diazapam. Pt arrives to ed a/ox3 balled up in pain lying on her side with tachypnea.

## 2015-12-03 NOTE — ED Notes (Signed)
Pt more relaxed.  Breathing pattern is regular.  Pt less anxious.  Does endorse pain but unable to rate.

## 2015-12-03 NOTE — ED Notes (Signed)
Pt is able to lay flat on stretcher. Pt is calm. Respirations even and unlabored. Pt no longer tearful.

## 2015-12-03 NOTE — ED Notes (Signed)
Patient transported to X-ray 

## 2015-12-03 NOTE — ED Provider Notes (Signed)
[facility] Emergency Department Provider Note        Time seen: ----------------------------------------- 9:39 AM on 12/03/2015 -----------------------------------------    I have reviewed the triage vital signs and the nursing notes.   HISTORY  Chief Complaint Back Pain    HPI Melissa Dineheresa A [name] is a 40 y.o. female who presents to ER for severe back pain, left arm pain and numbness. She is also complaining of left leg pain, numbness and some chest pain. She does report a history of back surgery last year with rods placed in her back. She had some vomiting with some blood noticed in her vomit last week she was told to stop taking her tramadol. She proceeded to stop taking all of her medications and threw them in the trash. At that time she was taking Percocet, Flexeril diazepam. She presents tachypnic   Past Medical History  Diagnosis Date  . Hidradenitis 2007  . Kidney infection   . Heart murmur     after child was born - has never had any problems  . Asthma     as a young adult - not for many years  . Depression   . Kidney stones   . Seizures (HCC)     as a baby   . Headache     migraines  . Anemia   . Family history of adverse reaction to anesthesia     Patient Active Problem List   Diagnosis Date Noted  . DDD (degenerative disc disease), lumbar 03/25/2015    Past Surgical History  Procedure Laterality Date  . Appendectomy  1997  . Knee surgery Left 1997  . Axillary hidradenitis excision Bilateral 2007  . Inguinal hidradenitis excision  06-18-14    left  . Kidney stone surgery    . Tubal ligation    . Cesarean section    . Posterior lumbar fusion N/A 03/25/2015    Procedure: POSTERIOR LUMBAR INTERBODY FUSION LUMBAR FOUR-FIVE;  Surgeon: Julio SicksHenry Pool, MD;  Location: [facility] OR;  Service: Neurosurgery;  Laterality: N/A;    Allergies Tylenol; Ultram; Celebrex; and Other  Social History Social History  Substance Use Topics  . Smoking  status: Current Every Day Smoker -- 0.50 packs/day for 10 years    Types: Cigarettes  . Smokeless tobacco: Never Used  . Alcohol Use: 12.6 oz/week    21 Cans of beer per week    Review of Systems Constitutional: Negative for fever. Eyes: Negative for visual changes. ENT: Negative for sore throat. Cardiovascular: Positive for chest pain Respiratory: Negative for shortness of breath. Gastrointestinal: Negative for abdominal pain, vomiting and diarrhea. Genitourinary: Negative for dysuria. Musculoskeletal: Positive for back pain, left arm pain, left leg pain Skin: Negative for rash. Neurological: Negative for headaches, positive for left arm and left leg numbness  10-point ROS otherwise negative.  ____________________________________________   PHYSICAL EXAM:  VITAL SIGNS: ED Triage Vitals  Enc Vitals Group     BP 12/03/15 0934 124/110 mmHg     Pulse Rate 12/03/15 0932 72     Resp --      Temp 12/03/15 0932 97.7 F (36.5 C)     Temp Source 12/03/15 0932 Oral     SpO2 12/03/15 0932 100 %     Weight --      Height 12/03/15 0932 5\' 8"  (1.727 m)     Head Cir --      Peak Flow --      Pain Score 12/03/15 0933 10  Pain Loc --      Pain Edu? --      Excl. in GC? --     Constitutional: Alert, Anxious, hyperventilating Eyes: Conjunctivae are normal. PERRL. Normal extraocular movements. ENT   Head: Normocephalic and atraumatic.   Nose: No congestion/rhinnorhea.   Mouth/Throat: Mucous membranes are moist.   Neck: No stridor. Cardiovascular: Normal rate, regular rhythm. No murmurs, rubs, or gallops. Respiratory: Hyperventilation with clear lungs bilaterally. Gastrointestinal: Soft and nontender. Normal bowel sounds Musculoskeletal: Pain with range of motion of the left upper and left lower extremities. Neurologic:  Normal speech and language. No gross focal neurologic deficits are appreciated. Apparent normal sensory function. Skin:  Skin is warm, dry and  intact. No rash noted. Psychiatric: Anxious mood and affect. ____________________________________________  ED COURSE:  Pertinent labs & imaging results that were available during my care of the patient were reviewed by me and considered in my medical decision making (see chart for details). Patient presents to ER with multiple symptoms including numbness, pain and anxiety. We will give IV Toradol, Valium and check basic labs. ____________________________________________    LABS (pertinent positives/negatives)  Labs Reviewed  CBC WITH DIFFERENTIAL/PLATELET - Abnormal; Notable for the following:    WBC 11.5 (*)    Neutro Abs 7.3 (*)    All other components within normal limits  COMPREHENSIVE METABOLIC PANEL - Abnormal; Notable for the following:    Chloride 112 (*)    CO2 20 (*)    ALT 10 (*)    Total Bilirubin 0.2 (*)    All other components within normal limits  URINALYSIS COMPLETEWITH MICROSCOPIC ([facility] ONLY) - Abnormal; Notable for the following:    Color, Urine STRAW (*)    APPearance CLEAR (*)    Specific Gravity, Urine 1.003 (*)    Hgb urine dipstick 3+ (*)    Bacteria, UA RARE (*)    Squamous Epithelial / LPF 0-5 (*)    All other components within normal limits  POC URINE PREG, ED  POCT PREGNANCY, URINE    RADIOLOGY  IMPRESSION: Unchanged appearance of L4-5 PLIF. No acute abnormality identified.  ____________________________________________  FINAL ASSESSMENT AND PLAN  Chronic back pain, panic attack  Plan: Patient with labs and imaging as dictated above. Patient is feeling better after muscle relaxants and anti-inflammatory medication. She'll be discharged on a combination of same. Advise close outpatient follow-up with her neurosurgeon.   Emily Filbert, MD   Note: This dictation was prepared with Dragon dictation. Any transcriptional errors that result from this process are unintentional   Emily Filbert, MD 12/03/15 1218

## 2015-12-03 NOTE — Discharge Instructions (Signed)
Chronic Back Pain  When back pain lasts longer than 3 months, it is called chronic back pain.People with chronic back pain often go through certain periods that are more intense (flare-ups).  CAUSES Chronic back pain can be caused by wear and tear (degeneration) on different structures in your back. These structures include:  The bones of your spine (vertebrae) and the joints surrounding your spinal cord and nerve roots (facets).  The strong, fibrous tissues that connect your vertebrae (ligaments). Degeneration of these structures may result in pressure on your nerves. This can lead to constant pain. HOME CARE INSTRUCTIONS  Avoid bending, heavy lifting, prolonged sitting, and activities which make the problem worse.  Take brief periods of rest throughout the day to reduce your pain. Lying down or standing usually is better than sitting while you are resting.  Take over-the-counter or prescription medicines only as directed by your caregiver. SEEK IMMEDIATE MEDICAL CARE IF:   You have weakness or numbness in one of your legs or feet.  You have trouble controlling your bladder or bowels.  You have nausea, vomiting, abdominal pain, shortness of breath, or fainting.   This information is not intended to replace advice given to you by your health care provider. Make sure you discuss any questions you have with your health care provider.   Document Released: 07/29/2004 Document Revised: 09/13/2011 Document Reviewed: 12/09/2014 Elsevier Interactive Patient Education 2016 Elsevier Inc.  Panic Attacks Panic attacks are sudden, short-livedsurges of severe anxiety, fear, or discomfort. They may occur for no reason when you are relaxed, when you are anxious, or when you are sleeping. Panic attacks may occur for a number of reasons:   Healthy people occasionally have panic attacks in extreme, life-threatening situations, such as war or natural disasters. Normal anxiety is a protective mechanism  of the body that helps us react to danger (fight or flight response).  Panic attacks are often seen with anxiety disorders, such as panic disorder, social anxiety disorder, generalized anxiety disorder, and phobias. Anxiety disorders cause excessive or uncontrollable anxiety. They may interfere with your relationships or other life activities.  Panic attacks are sometimes seen with other mental illnesses, such as depression and posttraumatic stress disorder.  Certain medical conditions, prescription medicines, and drugs of abuse can cause panic attacks. SYMPTOMS  Panic attacks start suddenly, peak within 20 minutes, and are accompanied by four or more of the following symptoms:  Pounding heart or fast heart rate (palpitations).  Sweating.  Trembling or shaking.  Shortness of breath or feeling smothered.  Feeling choked.  Chest pain or discomfort.  Nausea or strange feeling in your stomach.  Dizziness, light-headedness, or feeling like you will faint.  Chills or hot flushes.  Numbness or tingling in your lips or hands and feet.  Feeling that things are not real or feeling that you are not yourself.  Fear of losing control or going crazy.  Fear of dying. Some of these symptoms can mimic serious medical conditions. For example, you may think you are having a heart attack. Although panic attacks can be very scary, they are not life threatening. DIAGNOSIS  Panic attacks are diagnosed through an assessment by your health care provider. Your health care provider will ask questions about your symptoms, such as where and when they occurred. Your health care provider will also ask about your medical history and use of alcohol and drugs, including prescription medicines. Your health care provider may order blood tests or other studies to rule out a serious  medical condition. Your health care provider may refer you to a mental health professional for further evaluation. TREATMENT   Most  healthy people who have one or two panic attacks in an extreme, life-threatening situation will not require treatment.  The treatment for panic attacks associated with anxiety disorders or other mental illness typically involves counseling with a mental health professional, medicine, or a combination of both. Your health care provider will help determine what treatment is best for you.  Panic attacks due to physical illness usually go away with treatment of the illness. If prescription medicine is causing panic attacks, talk with your health care provider about stopping the medicine, decreasing the dose, or substituting another medicine.  Panic attacks due to alcohol or drug abuse go away with abstinence. Some adults need professional help in order to stop drinking or using drugs. HOME CARE INSTRUCTIONS   Take all medicines as directed by your health care provider.   Schedule and attend follow-up visits as directed by your health care provider. It is important to keep all your appointments. SEEK MEDICAL CARE IF:  You are not able to take your medicines as prescribed.  Your symptoms do not improve or get worse. SEEK IMMEDIATE MEDICAL CARE IF:   You experience panic attack symptoms that are different than your usual symptoms.  You have serious thoughts about hurting yourself or others.  You are taking medicine for panic attacks and have a serious side effect. MAKE SURE YOU:  Understand these instructions.  Will watch your condition.  Will get help right away if you are not doing well or get worse.   This information is not intended to replace advice given to you by your health care provider. Make sure you discuss any questions you have with your health care provider.   Document Released: 06/21/2005 Document Revised: 06/26/2013 Document Reviewed: 02/02/2013 Elsevier Interactive Patient Education [employer]

## 2015-12-08 DIAGNOSIS — M25562 Pain in left knee: Secondary | ICD-10-CM | POA: Diagnosis not present

## 2015-12-08 DIAGNOSIS — M25662 Stiffness of left knee, not elsewhere classified: Secondary | ICD-10-CM | POA: Diagnosis not present

## 2015-12-16 DIAGNOSIS — M94262 Chondromalacia, left knee: Secondary | ICD-10-CM | POA: Diagnosis not present

## 2015-12-16 DIAGNOSIS — M545 Low back pain: Secondary | ICD-10-CM | POA: Diagnosis not present

## 2015-12-16 DIAGNOSIS — M1712 Unilateral primary osteoarthritis, left knee: Secondary | ICD-10-CM | POA: Diagnosis not present

## 2015-12-16 DIAGNOSIS — F17200 Nicotine dependence, unspecified, uncomplicated: Secondary | ICD-10-CM | POA: Diagnosis not present

## 2015-12-16 DIAGNOSIS — G8929 Other chronic pain: Secondary | ICD-10-CM | POA: Diagnosis not present

## 2015-12-19 ENCOUNTER — Inpatient Hospital Stay: Admission: RE | Admit: 2015-12-19 | Payer: Medicare Other | Source: Ambulatory Visit

## 2015-12-19 ENCOUNTER — Encounter: Payer: Self-pay | Admitting: *Deleted

## 2015-12-19 NOTE — Patient Instructions (Signed)
  Your procedure is scheduled on: 6/26 Report to Day Surgery. To find out your arrival time please call [phone] between 1PM - 3PM on 6/23  Remember: Instructions that are not followed completely may result in serious medical risk, up to and including death, or upon the discretion of your surgeon and anesthesiologist your surgery may need to be rescheduled.    __x__ 1. Do not eat food or drink liquids after midnight. No gum chewing or hard candies.     __x__ 2. No Alcohol for 24 hours before or after surgery.   __x__ 3. Do Not Smoke For 24 Hours Prior to Your Surgery.   ____ 4. Bring all medications with you on the day of surgery if instructed.    __x__ 5. Notify your doctor if there is any change in your medical condition     (cold, fever, infections).       Do not wear jewelry, make-up, hairpins, clips or nail polish.  Do not wear lotions, powders, or perfumes. You may wear deodorant.  Do not shave 48 hours prior to surgery. Men may shave face and neck.  Do not bring valuables to the hospital.    [facility] is not responsible for any belongings or valuables.               Contacts, dentures or bridgework may not be worn into surgery.  Leave your suitcase in the car. After surgery it may be brought to your room.  For patients admitted to the hospital, discharge time is determined by your                treatment team.   Patients discharged the day of surgery will not be allowed to drive home.   Please read over the following fact sheets that you were given:   Surgical Site Infection Prevention   _x___ Take these medicines the morning of surgery with A SIP OF WATER:    1. Pain medicine  2.   3.   4.  5.  6.  ____ Fleet Enema (as directed)   __x__ Use CHG Soap as directed  ____ Use inhalers on the day of surgery  ____ Stop metformin 2 days prior to surgery    ____ Take 1/2 of usual insulin dose the night before surgery and none on the morning of surgery.    ____ Stop Coumadin/Plavix/aspirin on   __x__ Stop Anti-inflammatories on 6/19   ____ Stop supplements until after surgery.    ____ Bring C-Pap to the hospital.

## 2015-12-23 ENCOUNTER — Other Ambulatory Visit: Payer: Self-pay | Admitting: Specialist

## 2015-12-26 NOTE — Pre-Procedure Instructions (Signed)
CARDIAC CLEARANCE NOTE ON CHART FROM DR MASOUD'S OFFICE

## 2015-12-29 ENCOUNTER — Ambulatory Visit: Payer: Medicare Other | Admitting: Registered Nurse

## 2015-12-29 ENCOUNTER — Encounter
Admission: RE | Disposition: A | Discharge status: Home / Self Care | Payer: Self-pay | Source: Ambulatory Visit | Attending: Specialist

## 2015-12-29 ENCOUNTER — Encounter: Payer: Self-pay | Admitting: *Deleted

## 2015-12-29 ENCOUNTER — Ambulatory Visit
Admission: RE | Admit: 2015-12-29 | Discharge: 2015-12-29 | Disposition: A | Discharge status: Home / Self Care | Payer: Medicare Other | Source: Ambulatory Visit | Attending: Specialist | Admitting: Specialist

## 2015-12-29 DIAGNOSIS — M23301 Other meniscus derangements, unspecified lateral meniscus, left knee: Secondary | ICD-10-CM

## 2015-12-29 DIAGNOSIS — S83272A Complex tear of lateral meniscus, current injury, left knee, initial encounter: Secondary | ICD-10-CM | POA: Diagnosis not present

## 2015-12-29 DIAGNOSIS — M6588 Other synovitis and tenosynovitis, other site: Secondary | ICD-10-CM | POA: Insufficient documentation

## 2015-12-29 DIAGNOSIS — M94262 Chondromalacia, left knee: Secondary | ICD-10-CM | POA: Diagnosis not present

## 2015-12-29 DIAGNOSIS — F17210 Nicotine dependence, cigarettes, uncomplicated: Secondary | ICD-10-CM | POA: Insufficient documentation

## 2015-12-29 DIAGNOSIS — F329 Major depressive disorder, single episode, unspecified: Secondary | ICD-10-CM | POA: Diagnosis not present

## 2015-12-29 DIAGNOSIS — S83282A Other tear of lateral meniscus, current injury, left knee, initial encounter: Secondary | ICD-10-CM | POA: Diagnosis not present

## 2015-12-29 DIAGNOSIS — M1990 Unspecified osteoarthritis, unspecified site: Secondary | ICD-10-CM | POA: Insufficient documentation

## 2015-12-29 HISTORY — PX: KNEE ARTHROSCOPY: SHX127

## 2015-12-29 LAB — POCT PREGNANCY, URINE: Preg Test, Ur: NEGATIVE

## 2015-12-29 SURGERY — ARTHROSCOPY, KNEE
Anesthesia: General | Laterality: Left | Wound class: Clean

## 2015-12-29 MED ORDER — MIDAZOLAM HCL 2 MG/2ML IJ SOLN
INTRAMUSCULAR | Status: DC | PRN
  Administered 2015-12-29: 2 mg via INTRAVENOUS

## 2015-12-29 MED ORDER — ONDANSETRON HCL 4 MG/2ML IJ SOLN: 4.0000 mg | Freq: Once | INTRAMUSCULAR | Status: DC | PRN

## 2015-12-29 MED ORDER — FAMOTIDINE 20 MG PO TABS
20.0000 mg | ORAL_TABLET | Freq: Once | ORAL | Status: AC
  Administered 2015-12-29: 20 mg via ORAL

## 2015-12-29 MED ORDER — FAMOTIDINE 20 MG PO TABS
ORAL_TABLET | ORAL | Status: AC
  Filled 2015-12-29: qty 1

## 2015-12-29 MED ORDER — OXYCODONE HCL 5 MG PO TABS
ORAL_TABLET | ORAL | Status: AC
  Filled 2015-12-29: qty 1

## 2015-12-29 MED ORDER — FENTANYL CITRATE (PF) 100 MCG/2ML IJ SOLN
INTRAMUSCULAR | Status: AC
  Administered 2015-12-29: 25 ug via INTRAVENOUS
  Filled 2015-12-29: qty 2

## 2015-12-29 MED ORDER — MORPHINE SULFATE (PF) 4 MG/ML IV SOLN
INTRAVENOUS | Status: DC | PRN
  Administered 2015-12-29: 4 mg via INTRAVENOUS

## 2015-12-29 MED ORDER — GABAPENTIN 400 MG PO CAPS: 400.0000 mg | ORAL_CAPSULE | Freq: Three times a day (TID) | ORAL | Status: DC

## 2015-12-29 MED ORDER — CEFAZOLIN SODIUM-DEXTROSE 2-4 GM/100ML-% IV SOLN
INTRAVENOUS | Status: AC
  Filled 2015-12-29: qty 100

## 2015-12-29 MED ORDER — ACETAMINOPHEN 10 MG/ML IV SOLN
INTRAVENOUS | Status: AC
  Filled 2015-12-29: qty 100

## 2015-12-29 MED ORDER — OXYCODONE HCL 5 MG PO TABS: 5.0000 mg | ORAL_TABLET | Freq: Four times a day (QID) | ORAL | Status: DC | PRN

## 2015-12-29 MED ORDER — MELOXICAM 7.5 MG PO TABS
ORAL_TABLET | ORAL | Status: AC
  Filled 2015-12-29: qty 2

## 2015-12-29 MED ORDER — FENTANYL CITRATE (PF) 100 MCG/2ML IJ SOLN
25.0000 ug | INTRAMUSCULAR | Status: DC | PRN
  Administered 2015-12-29 (×4): 25 ug via INTRAVENOUS

## 2015-12-29 MED ORDER — PROPOFOL 10 MG/ML IV BOLUS
INTRAVENOUS | Status: DC | PRN
  Administered 2015-12-29: 160 mg via INTRAVENOUS

## 2015-12-29 MED ORDER — MELOXICAM 7.5 MG PO TABS
15.0000 mg | ORAL_TABLET | Freq: Once | ORAL | Status: AC
  Administered 2015-12-29: 15 mg via ORAL

## 2015-12-29 MED ORDER — GABAPENTIN 400 MG PO CAPS
400.0000 mg | ORAL_CAPSULE | Freq: Once | ORAL | Status: AC
  Administered 2015-12-29: 400 mg via ORAL

## 2015-12-29 MED ORDER — OXYCODONE HCL 5 MG PO TABS
5.0000 mg | ORAL_TABLET | Freq: Four times a day (QID) | ORAL | Status: DC | PRN
  Administered 2015-12-29: 5 mg via ORAL
  Filled 2015-12-29: qty 1

## 2015-12-29 MED ORDER — LACTATED RINGERS IV SOLN
INTRAVENOUS | Status: DC
  Administered 2015-12-29 (×2): via INTRAVENOUS

## 2015-12-29 MED ORDER — MORPHINE SULFATE (PF) 4 MG/ML IV SOLN
INTRAVENOUS | Status: AC
  Filled 2015-12-29: qty 1

## 2015-12-29 MED ORDER — HYDROMORPHONE HCL 1 MG/ML IJ SOLN
0.2500 mg | INTRAMUSCULAR | Status: DC | PRN
  Administered 2015-12-29 (×4): 0.5 mg via INTRAVENOUS

## 2015-12-29 MED ORDER — MIDAZOLAM HCL 5 MG/5ML IJ SOLN
INTRAMUSCULAR | Status: AC
  Administered 2015-12-29: 0.5 mg
  Filled 2015-12-29: qty 5

## 2015-12-29 MED ORDER — CHLORHEXIDINE GLUCONATE CLOTH 2 % EX PADS: 6.0000 | MEDICATED_PAD | Freq: Once | CUTANEOUS | Status: DC

## 2015-12-29 MED ORDER — HYDROMORPHONE HCL 1 MG/ML IJ SOLN
INTRAMUSCULAR | Status: AC
  Administered 2015-12-29: 0.5 mg via INTRAVENOUS
  Filled 2015-12-29: qty 1

## 2015-12-29 MED ORDER — CEFAZOLIN SODIUM-DEXTROSE 2-4 GM/100ML-% IV SOLN
2.0000 g | INTRAVENOUS | Status: AC
  Administered 2015-12-29: 2 g via INTRAVENOUS

## 2015-12-29 MED ORDER — ONDANSETRON HCL 4 MG/2ML IJ SOLN
INTRAMUSCULAR | Status: DC | PRN
  Administered 2015-12-29: 4 mg via INTRAVENOUS

## 2015-12-29 MED ORDER — BUPIVACAINE-EPINEPHRINE (PF) 0.5% -1:200000 IJ SOLN
INTRAMUSCULAR | Status: AC
  Filled 2015-12-29: qty 60

## 2015-12-29 MED ORDER — GABAPENTIN 400 MG PO CAPS
ORAL_CAPSULE | ORAL | Status: AC
  Filled 2015-12-29: qty 1

## 2015-12-29 MED ORDER — GLYCOPYRROLATE 0.2 MG/ML IJ SOLN
INTRAMUSCULAR | Status: DC | PRN
  Administered 2015-12-29: 0.2 mg via INTRAVENOUS

## 2015-12-29 MED ORDER — LIDOCAINE HCL (CARDIAC) 20 MG/ML IV SOLN
INTRAVENOUS | Status: DC | PRN
  Administered 2015-12-29: 100 mg via INTRAVENOUS

## 2015-12-29 MED ORDER — KETOROLAC TROMETHAMINE 30 MG/ML IJ SOLN
INTRAMUSCULAR | Status: DC | PRN
  Administered 2015-12-29: 30 mg via INTRAVENOUS

## 2015-12-29 MED ORDER — BUPIVACAINE-EPINEPHRINE (PF) 0.5% -1:200000 IJ SOLN
INTRAMUSCULAR | Status: DC | PRN
  Administered 2015-12-29 (×2): 30 mL via PERINEURAL

## 2015-12-29 MED ORDER — FENTANYL CITRATE (PF) 100 MCG/2ML IJ SOLN
INTRAMUSCULAR | Status: DC | PRN
  Administered 2015-12-29 (×2): 50 ug via INTRAVENOUS

## 2015-12-29 MED ORDER — MIDAZOLAM HCL 5 MG/5ML IJ SOLN: 0.5000 mg | Freq: Once | INTRAMUSCULAR | Status: AC

## 2015-12-29 SURGICAL SUPPLY — 24 items
BAG COUNTER SPONGE EZ (MISCELLANEOUS) IMPLANT
BLADE AGGRESSIVE PLUS 4.0 (BLADE) IMPLANT
BUR RADIUS 4.0X18.5 (BURR) ×2 IMPLANT
CHLORAPREP W/TINT 26ML (MISCELLANEOUS) ×2 IMPLANT
CUFF TOURN 24 STER (MISCELLANEOUS) IMPLANT
CUFF TOURN 30 STER DUAL PORT (MISCELLANEOUS) IMPLANT
CUTTER SLOTTED WHISKER 4.0 (BURR) IMPLANT
GAUZE SPONGE 4X4 12PLY STRL (GAUZE/BANDAGES/DRESSINGS) ×2 IMPLANT
GLOVE BIO SURGEON STRL SZ8 (GLOVE) ×2 IMPLANT
GOWN STRL REUS W/ TWL LRG LVL3 (GOWN DISPOSABLE) ×2 IMPLANT
GOWN STRL REUS W/TWL LRG LVL3 (GOWN DISPOSABLE) ×2
IV LACTATED RINGER IRRG 3000ML (IV SOLUTION) ×6
IV LR IRRIG 3000ML ARTHROMATIC (IV SOLUTION) ×6 IMPLANT
KIT RM TURNOVER STRD PROC AR (KITS) ×2 IMPLANT
MANIFOLD NEPTUNE II (INSTRUMENTS) ×2 IMPLANT
NDL SAFETY 18GX1.5 (NEEDLE) ×4 IMPLANT
PACK ARTHROSCOPY KNEE (MISCELLANEOUS) ×2 IMPLANT
SET TUBE SUCT SHAVER OUTFL 24K (TUBING) ×2 IMPLANT
SOL PREP PVP 2OZ (MISCELLANEOUS) ×2
SOLUTION PREP PVP 2OZ (MISCELLANEOUS) ×1 IMPLANT
SUT ETHILON 3 0 FSLX (SUTURE) ×2 IMPLANT
SYR 30ML LL (SYRINGE) ×4 IMPLANT
TUBING ARTHRO INFLOW-ONLY STRL (TUBING) ×2 IMPLANT
WAND HAND CNTRL MULTIVAC 50 (MISCELLANEOUS) ×2 IMPLANT

## 2015-12-29 NOTE — Progress Notes (Signed)
Feeling much better pain 4 out of 10  Calmer

## 2015-12-29 NOTE — Progress Notes (Signed)
Shortly upon arrival to pacu  Pt thrashing about crying incomprehensible

## 2015-12-29 NOTE — Op Note (Signed)
12/29/2015  2:21 PM  PATIENT:  Melissa [name]    PRE-OPERATIVE DIAGNOSIS:  Tear lateral meniscus left knee  POST-OPERATIVE DIAGNOSIS:  Same  PROCEDURE:  ARTHROSCOPY KNEE with partial lateral meniscectomy and chondroplasty  SURGEON:  Elvert Cumpton E, MD  COMPLICATIONS:   None  EBL:  None  TOURNIQUET TIME:   None     .  ANESTHESIA:  Gen. LMA  PREOPERATIVE INDICATIONS:  Melissa Dineheresa A Offord is a  40 y.o. 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    The risks benefits and alternatives were discussed with the patient preoperatively including but not limited to the risks of infection, bleeding, nerve injury, cardiopulmonary complications, the need for revision surgery, among others, and the patient was willing to proceed.  OPERATIVE IMPLANTS: None  OPERATIVE FINDINGS: There was considerable grade 3 chondromalacia of the lateral femoral condyle and tibia. Is a badly torn lateral meniscus. There was extensive synovitis. Anterior cruciate ligament and PCL were normal. Medial joint compartment was normal. The patellofemoral joint was normal. There are no significant loose bodies.  OPERATIVE PROCEDURE: The patient was brought to the operating room and underwent satisfactory general LMA anesthesia in the supine position.  The leg was prepped and draped in a sterile fashion.  Arthroscopy was carried out through standard portals.  The above findings were encountered upon arthroscopy.  The lateral meniscus was debrided with the basket forceps, motorized resector, and ArthroCare wand. Significant lumpectomy was carried out for visualization and to free up the joint. The articular surfaces in the lateral joint compartment were gently debrided. Remaining compartments were normal to exam.   Once this was completed and stabilized the joint was thoroughly irrigated.  Instruments were removed and knee wounds closed with  3-0 nylon.  Sponge and needle counts were correct. A dry sterile dressing was applied.  Patient was awakened and taken to recovery in good condition.   Valinda HoarHoward E Hoover Grewe, MD

## 2015-12-29 NOTE — Anesthesia Procedure Notes (Signed)
Procedure Name: Intubation Date/Time: 12/29/2015 12:48 PM Performed by: Stormy FabianURTIS, Samantha Olivera Pre-anesthesia Checklist: Patient identified, Patient being monitored, Timeout performed, Emergency Drugs available and Suction available Patient Re-evaluated:Patient Re-evaluated prior to inductionOxygen Delivery Method: Circle system utilized Preoxygenation: Pre-oxygenation with 100% oxygen Intubation Type: IV induction Ventilation: Mask ventilation without difficulty LMA: LMA inserted LMA Size: 4.5 Tube type: Oral Number of attempts: 1 Placement Confirmation: ETT inserted through vocal cords under direct vision,  positive ETCO2 and breath sounds checked- equal and bilateral Secured at: 21 cm Tube secured with: Tape Dental Injury: Teeth and Oropharynx as per pre-operative assessment

## 2015-12-29 NOTE — H&P (Signed)
THE PATIENT WAS SEEN PRIOR TO SURGERY TODAY.  HISTORY, ALLERGIES, HOME MEDICATIONS AND OPERATIVE PROCEDURE WERE REVIEWED. RISKS AND BENEFITS OF SURGERY DISCUSSED WITH PATIENT AGAIN.  NO CHANGES FROM INITIAL HISTORY AND PHYSICAL NOTED.    

## 2015-12-29 NOTE — Transfer of Care (Signed)
Immediate Anesthesia Transfer of Care Note  Patient: Melissa [name]  Procedure(s) Performed: Procedure(s): ARTHROSCOPY KNEE (Left)  Patient Location: PACU  Anesthesia Type:General  Level of Consciousness: sedated  Airway & Oxygen Therapy: Patient Spontanous Breathing and Patient connected to face mask oxygen  Post-op Assessment: Report given to RN and Post -op Vital signs reviewed and stable  Post vital signs: Reviewed and stable  Last Vitals:  Filed Vitals:   12/29/15 1056 12/29/15 1429  BP: 119/81 133/84  Pulse: 75 99  Temp: 36.6 C 36.2 C  Resp: 16 12    Complications: No apparent anesthesia complications

## 2015-12-29 NOTE — Progress Notes (Signed)
Pt was having a bad dream  She said  A lot calmer now   Ice pack to left knee

## 2015-12-29 NOTE — Anesthesia Preprocedure Evaluation (Signed)
Anesthesia Evaluation  Patient identified by MRN, date of birth, ID band Patient awake    Reviewed: Allergy & Precautions, NPO status , Patient's Chart, lab work & pertinent test results, reviewed documented beta blocker date and time   Airway Mallampati: III  TM Distance: >3 FB     Dental  (+) Chipped   Pulmonary Current Smoker,           Cardiovascular      Neuro/Psych  Headaches, Seizures -,  PSYCHIATRIC DISORDERS Depression    GI/Hepatic   Endo/Other    Renal/GU      Musculoskeletal  (+) Arthritis ,   Abdominal   Peds  Hematology   Anesthesia Other Findings Seizures only as a baby - febrile. Lumbar fusion. Obese.  Reproductive/Obstetrics                             Anesthesia Physical Anesthesia Plan  ASA: III  Anesthesia Plan: General   Post-op Pain Management:    Induction: Intravenous  Airway Management Planned: LMA  Additional Equipment:   Intra-op Plan:   Post-operative Plan:   Informed Consent: I have reviewed the patients History and Physical, chart, labs and discussed the procedure including the risks, benefits and alternatives for the proposed anesthesia with the patient or authorized representative who has indicated his/her understanding and acceptance.     Plan Discussed with: CRNA  Anesthesia Plan Comments:         Anesthesia Quick Evaluation

## 2015-12-29 NOTE — Discharge Instructions (Signed)
AMBULATORY SURGERY  DISCHARGE INSTRUCTIONS   1) The drugs that you were given will stay in your system until tomorrow so for the next 24 hours you should not:  A) Drive an automobile B) Make any legal decisions C) Drink any alcoholic beverage   2) You may resume regular meals tomorrow.  Today it is better to start with liquids and gradually work up to solid foods.  You may eat anything you prefer, but it is better to start with liquids, then soup and crackers, and gradually work up to solid foods.   3) Please notify your doctor immediately if you have any unusual bleeding, trouble breathing, redness and pain at the surgery site, drainage, fever, or pain not relieved by medication.    4) Additional Instructions:   Please contact your physician with any problems or Same Day Surgery at [phone], Monday through Friday 6 am to 4 pm, or [place] [facility] Main number at [phone].    Knee Arthroscopy Knee arthroscopy is a surgical procedure that is used to examine the inside of your knee joint and repair any damage. The surgeon puts a small, lighted instrument with a camera on the tip (arthroscope) through a small incision in your knee. The camera sends pictures to a monitor in the operating room. Your surgeon uses those pictures to guide the surgical instruments through other incisions to the area of damage. Knee arthroscopy can be used to treat many types of knee problems. It may be used:  To repair a torn ligament.  To repair or remove damaged tissue.  To remove a fluid-filled sac (cyst) from your knee. LET [facility] KNOW ABOUT:  Any allergies you have.  All medicines you are taking, including vitamins, herbs, eye drops, creams, and over-the-counter medicines.  Previous problems you or members of your family have had with the use of anesthetics.  Any blood disorders you have.  Previous surgeries you have had.  Any medical conditions you may  have. RISKS AND COMPLICATIONS Generally, this is a safe procedure. However, problems may occur, including:  Infection.  Bleeding.  Damage to blood vessels, nerves, or structures of your knee.  A blood clot that forms in your leg and travels to your lung.  Failure to relieve symptoms. BEFORE THE PROCEDURE  Ask your health care provider about:  Changing or stopping your regular medicines. This is especially important if you are taking diabetes medicines or blood thinners.  Taking medicines such as aspirin and ibuprofen. These medicines can thin your blood. Do not take these medicines before your procedure if your health care provider instructs you not to.  Follow your health care provider's instructions about eating or drinking restrictions.  Plan to have someone take you home after the procedure.  If you go home right after the procedure, plan to have someone with you for 24 hours.  Do not drink alcohol unless your health care provider says that you can.  Do not use any tobacco products, including cigarettes, chewing tobacco, or electronic cigarettes unless your health care provider says that you can. If you need help quitting, ask your health care provider.  You may have a physical exam. PROCEDURE  An IV tube will be inserted into one of your veins.  You will be given one or more of the following:  A medicine that helps you relax (sedative).  A medicine that numbs the area (local anesthetic).  A medicine that makes you fall asleep (general anesthetic).  A  medicine that is injected into your spine that numbs the area below and slightly above the injection site (spinal anesthetic).  A medicine that is injected into an area of your body that numbs everything below the injection site (regional anesthetic).  A cuff may be placed around your upper leg to slow bleeding during the procedure.  The surgeon will make a small number of incisions around your knee.  Your knee  joint will be flushed and filled with a germ-free (sterile) solution.  The arthroscope will be passed through an incision into your knee joint.  More instruments will be passed through other incisions to repair your knee as needed.  The fluid will be removed from your knee.  The incisions will be closed with adhesive strips or stitches (sutures).  A bandage (dressing) will be placed over your knee. The procedure may vary among health care providers and hospitals. AFTER THE PROCEDURE  Your blood pressure, heart rate, breathing rate and blood oxygen level will be monitored often until the medicines you were given have worn off.  You may be given medicine for pain.  You may get crutches to help you walk without using your knee to support your body weight.  You may have to wear compression stockings. These stocking help to prevent blood clots and reduce swelling in your legs.   This information is not intended to replace advice given to you by your health care provider. Make sure you discuss any questions you have with your health care provider.   Document Released: 06/18/2000 Document Revised: 11/05/2014 Document Reviewed: 06/17/2014 Elsevier Interactive Patient Education [employer]2016 [employer]vier Inc.

## 2015-12-29 NOTE — Progress Notes (Signed)
Versed given

## 2015-12-29 NOTE — Anesthesia Postprocedure Evaluation (Signed)
Anesthesia Post Note  Patient: Melissa [name]  Procedure(s) Performed: Procedure(s) (LRB): ARTHROSCOPY KNEE (Left)  Patient location during evaluation: PACU Anesthesia Type: General Level of consciousness: awake and alert Pain management: pain level controlled Vital Signs Assessment: post-procedure vital signs reviewed and stable Respiratory status: spontaneous breathing, nonlabored ventilation, respiratory function stable and patient connected to nasal cannula oxygen Cardiovascular status: blood pressure returned to baseline and stable Postop Assessment: no signs of nausea or vomiting Anesthetic complications: no    Last Vitals:  Filed Vitals:   12/29/15 1551 12/29/15 1631  BP: 142/68 156/83  Pulse:  68  Temp: 36.2 C   Resp: 16 16    Last Pain:  Filed Vitals:   12/29/15 1639  PainSc: 3                  Tytionna Cloyd S

## 2015-12-30 ENCOUNTER — Encounter: Payer: Self-pay | Admitting: Specialist

## 2016-01-12 DIAGNOSIS — M25562 Pain in left knee: Secondary | ICD-10-CM | POA: Diagnosis not present

## 2016-01-21 DIAGNOSIS — M25562 Pain in left knee: Secondary | ICD-10-CM | POA: Diagnosis not present

## 2016-02-14 IMAGING — CT CT ABD-PELV W/ CM
2 of 4 series · 15 of 46 positions shown, 17 images · IV contrast (omnipaque)
Comparison: CT abdomen pelvis - 06/20/2014

CLINICAL DATA: Sudden onset of right lower quadrant pain which
began yesterday. History of appendectomy.

EXAM:
CT ABDOMEN AND PELVIS WITH CONTRAST
TECHNIQUE: Multidetector CT imaging of the abdomen and pelvis was performed
using the standard protocol following bolus administration of
intravenous contrast.
CONTRAST:  100 cc Omnipaque 300

[Series 2: routine abd pel with · axial · 0.74mm/px · z∈[-335,+30]mm · 12 of 87 slices shown, 14 images]
[im 7/87  soft-tissue]
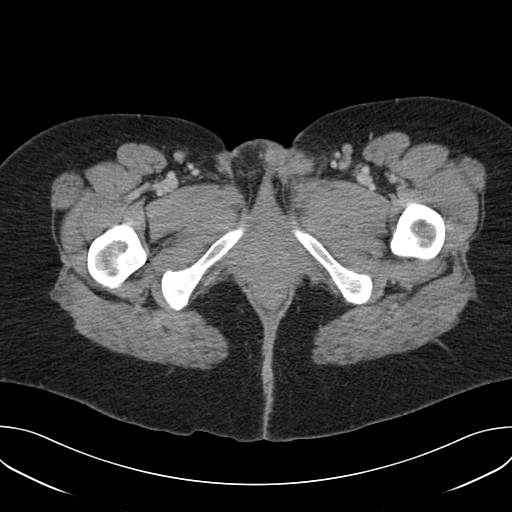
[im 7/87  bone]
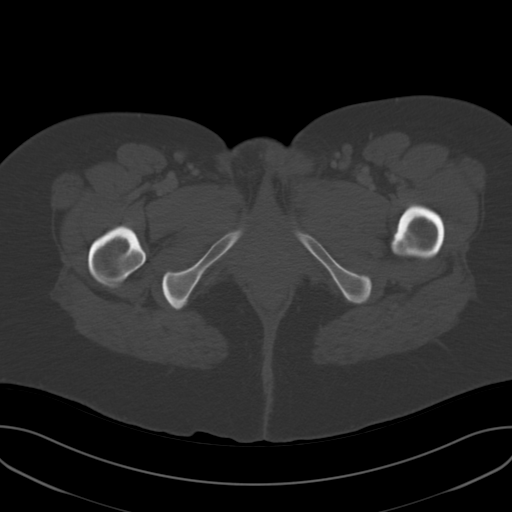
[im 13/87  soft-tissue]
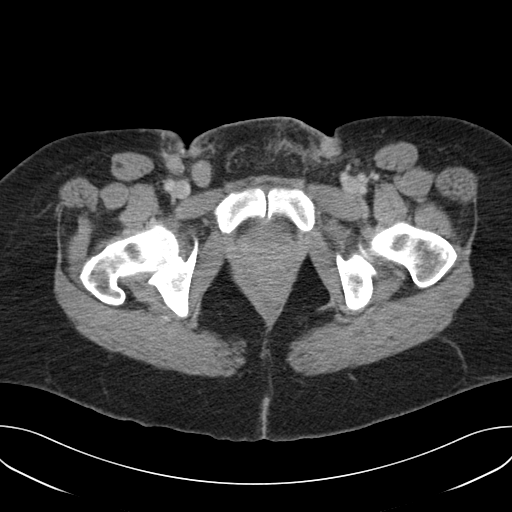
[im 20/87  soft-tissue]
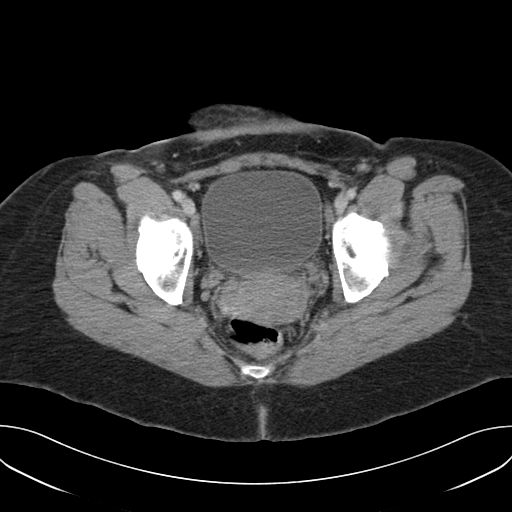
[im 26/87  soft-tissue]
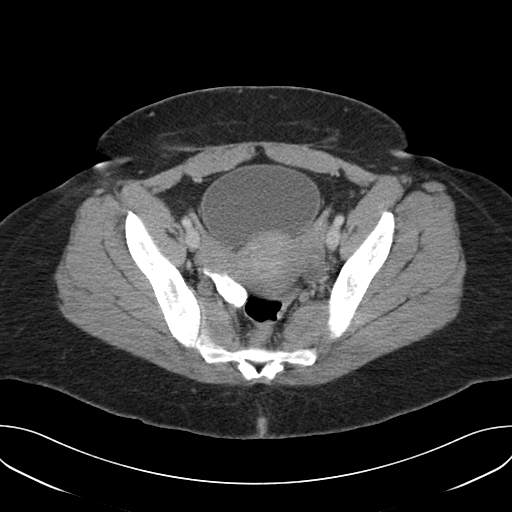
[im 32/87  soft-tissue]
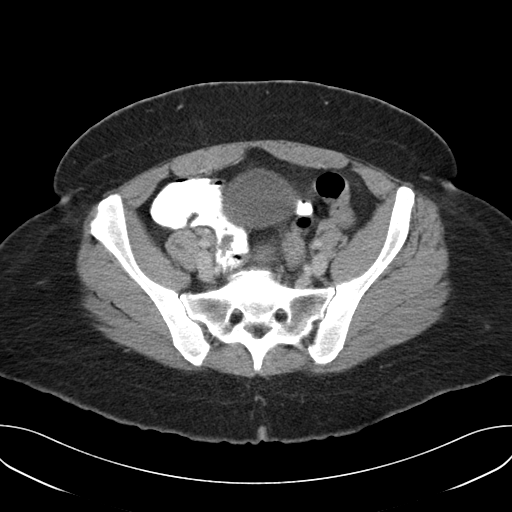
[im 39/87  soft-tissue]
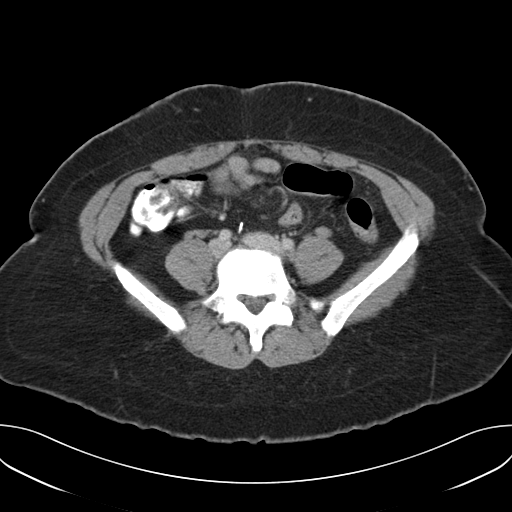
[im 48/87  soft-tissue]
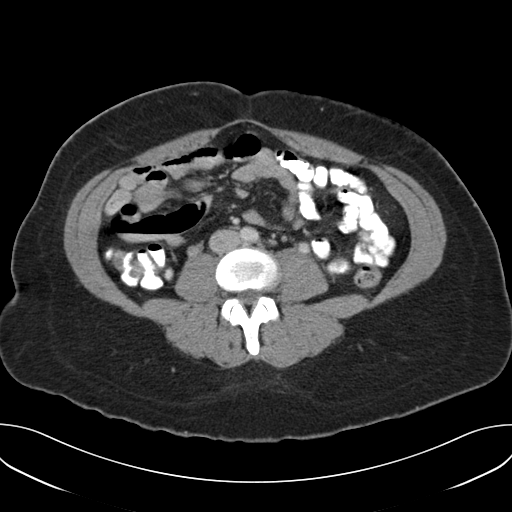
[im 55/87  soft-tissue]
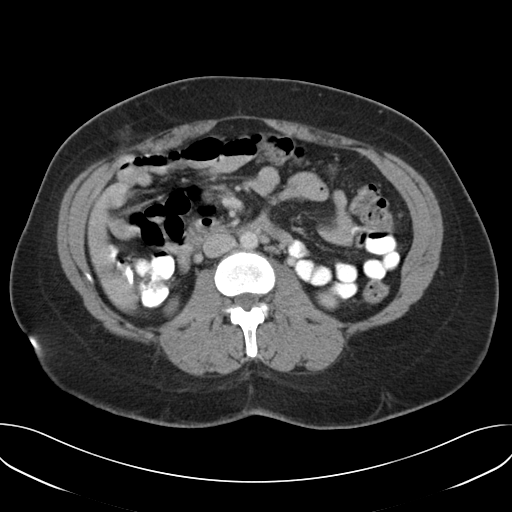
[im 61/87  soft-tissue]
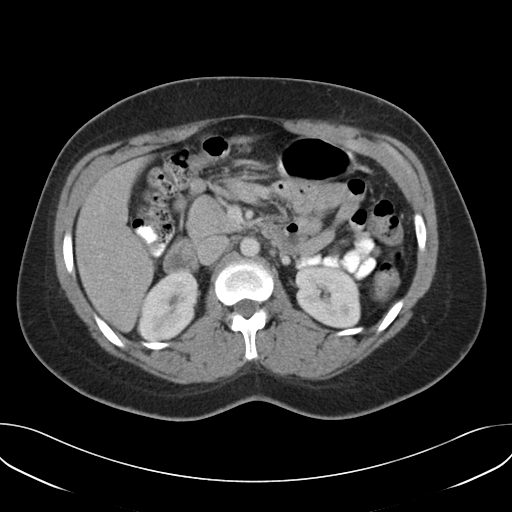
[im 61/87  bone]
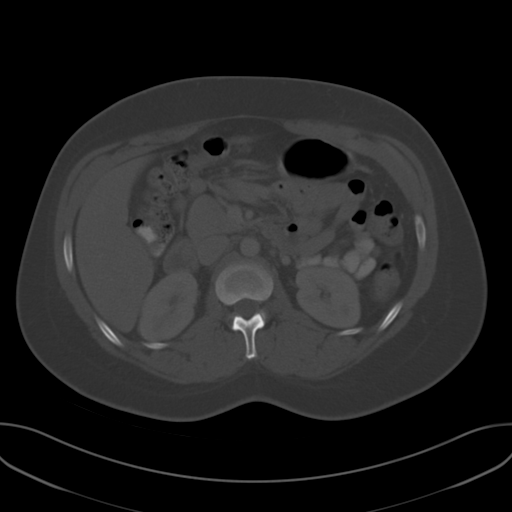
[im 67/87  soft-tissue]
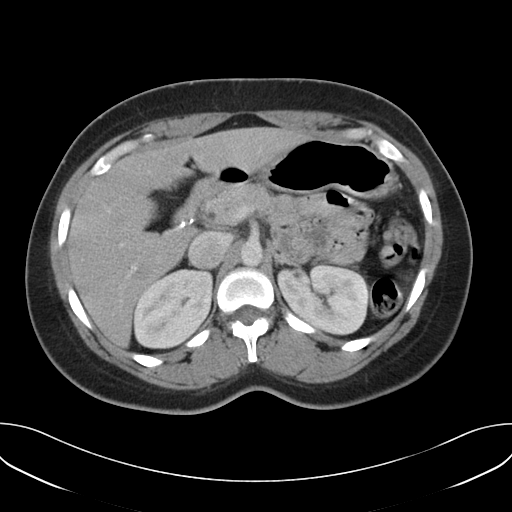
[im 74/87  soft-tissue]
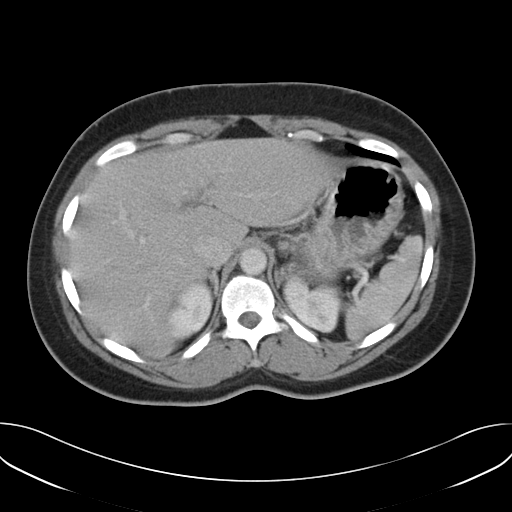
[im 80/87  soft-tissue]
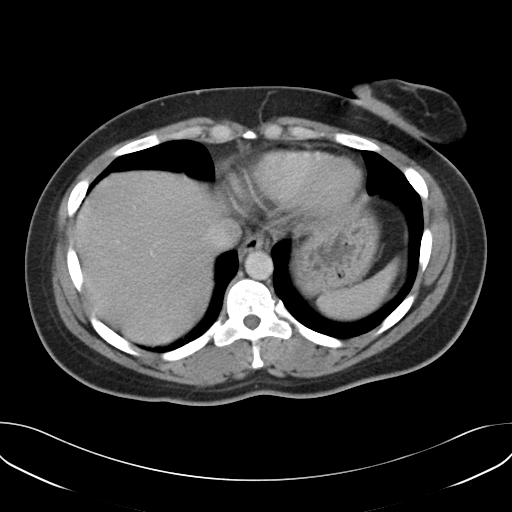

[Series 5: cor routine abd pel with · coronal · 0.75mm/px · 3 of 123 slices shown]
[im 41/123  soft-tissue]
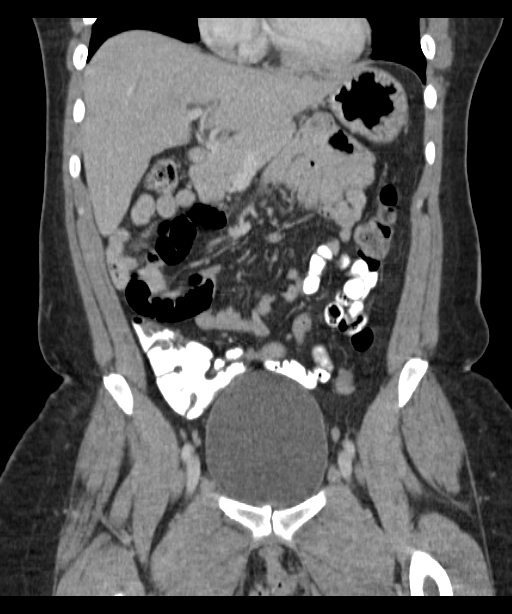
[im 55/123  soft-tissue]
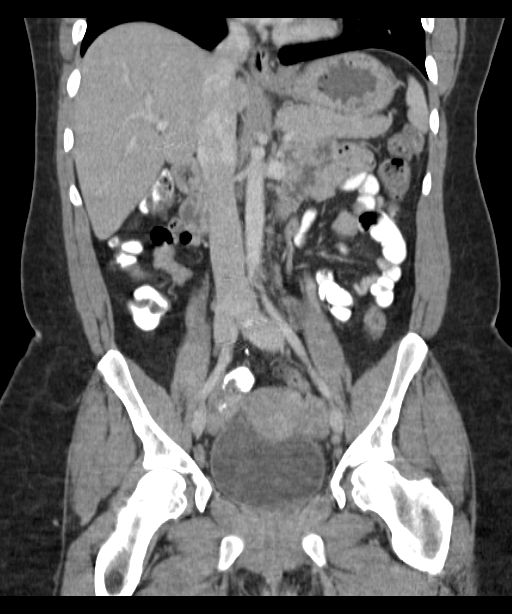
[im 68/123  soft-tissue]
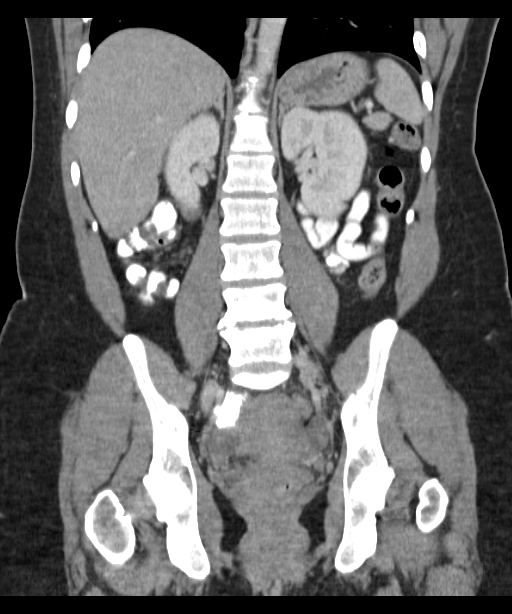

[15 of 46 positions shown; findings below may reference images not displayed]

FINDINGS: Normal hepatic contour. No discrete hepatic lesions. Post
cholecystectomy. No intra or extrahepatic biliary duct dilatation.
No ascites.

There is symmetric enhancement of the bilateral kidneys. No discrete
renal lesions. No definite renal stones in this postcontrast
examination. No urinary obstruction or perinephric stranding. Normal
appearance of the bilateral adrenal glands, pancreas and spleen.

Ingested enteric contrast extends to the level of the hepatic
flexure of the colon. Moderate colonic stool burden without evidence
of enteric obstruction. The bowel is normal in course and caliber
without wall thickening or evidence of obstruction. The appendix is
not visualized, compatible with provided history of prior
appendectomy. No pneumoperitoneum, pneumatosis or portal venous gas.

Normal caliber the abdominal aorta. The major branch vessels of the
abdominal aorta appear patent on this non CTA examination.

Scattered shotty retroperitoneal lymph nodes are individually not
enlarged by size criteria. No retroperitoneal, mesenteric or pelvic
lymphadenopathy.

Normal appearance of the pelvic organs. Normal appearance of the
urinary bladder given degree distention. No free fluid in the pelvic
cul-de-sac. A punctate phlebolith is noted within the left
hemipelvis.

Limited visualization of lower thorax demonstrates minimal symmetric
dependent subpleural ground-glass atelectasis. No discrete focal
airspace opacities. No pleural effusion.

Normal heart size.  No pericardial effusion.

No acute or aggressive osseus abnormalities. Moderate to severe DDD
of L4-L5 with disc space height loss, endplate irregularity and
sclerosis.

There is grossly unchanged stranding within the bilateral inguinal
creases (image 74, series 2) with associated inguinal adenopathy
with index right inguinal lymph node measuring 1.3 cm in greatest
short axis diameter (image 74, series 2) and index right inguinal
lymph node measuring 1 cm (image 73).
IMPRESSION: 1. No explanation for patient's right lower quadrant abdominal pain.
Specifically, no evidence of enteric or urinary obstruction.
2. Post cholecystectomy and appendectomy.
3. Grossly unchanged stranding about the bilateral inguinal creases
with associated regional inguinal adenopathy, similar to the [DATE]
examination and again presumably reactive in etiology. Clinical
correlation is advised.

## 2016-03-25 DIAGNOSIS — S83272D Complex tear of lateral meniscus, current injury, left knee, subsequent encounter: Secondary | ICD-10-CM | POA: Diagnosis not present

## 2016-04-15 DIAGNOSIS — M545 Low back pain: Secondary | ICD-10-CM | POA: Diagnosis not present

## 2016-04-21 DIAGNOSIS — G8929 Other chronic pain: Secondary | ICD-10-CM | POA: Diagnosis not present

## 2016-04-21 DIAGNOSIS — M545 Low back pain: Secondary | ICD-10-CM | POA: Diagnosis not present

## 2016-04-21 DIAGNOSIS — H9209 Otalgia, unspecified ear: Secondary | ICD-10-CM | POA: Diagnosis not present

## 2016-04-21 DIAGNOSIS — R51 Headache: Secondary | ICD-10-CM | POA: Diagnosis not present

## 2016-05-17 ENCOUNTER — Other Ambulatory Visit: Payer: Self-pay | Admitting: Otolaryngology

## 2016-05-17 DIAGNOSIS — H9202 Otalgia, left ear: Secondary | ICD-10-CM

## 2016-05-17 DIAGNOSIS — M26602 Left temporomandibular joint disorder, unspecified: Secondary | ICD-10-CM | POA: Diagnosis not present

## 2016-05-17 DIAGNOSIS — H905 Unspecified sensorineural hearing loss: Secondary | ICD-10-CM | POA: Diagnosis not present

## 2016-05-17 DIAGNOSIS — R51 Headache: Secondary | ICD-10-CM | POA: Diagnosis not present

## 2016-05-20 ENCOUNTER — Other Ambulatory Visit: Payer: Self-pay | Admitting: Physical Medicine & Rehabilitation

## 2016-05-20 DIAGNOSIS — M5416 Radiculopathy, lumbar region: Secondary | ICD-10-CM | POA: Diagnosis not present

## 2016-05-20 DIAGNOSIS — R10829 Rebound abdominal tenderness, unspecified site: Secondary | ICD-10-CM | POA: Diagnosis not present

## 2016-05-20 DIAGNOSIS — M25569 Pain in unspecified knee: Secondary | ICD-10-CM | POA: Diagnosis not present

## 2016-05-21 ENCOUNTER — Ambulatory Visit
Admission: RE | Admit: 2016-05-21 | Discharge: 2016-05-21 | Disposition: A | Discharge status: Home / Self Care | Payer: Medicare Other | Source: Ambulatory Visit | Attending: Otolaryngology | Admitting: Otolaryngology

## 2016-05-21 ENCOUNTER — Encounter: Payer: Self-pay | Admitting: Radiology

## 2016-05-21 DIAGNOSIS — H9202 Otalgia, left ear: Secondary | ICD-10-CM | POA: Diagnosis not present

## 2016-05-21 DIAGNOSIS — K029 Dental caries, unspecified: Secondary | ICD-10-CM | POA: Diagnosis not present

## 2016-05-21 DIAGNOSIS — R51 Headache: Secondary | ICD-10-CM | POA: Insufficient documentation

## 2016-06-16 DIAGNOSIS — M25569 Pain in unspecified knee: Secondary | ICD-10-CM | POA: Diagnosis not present

## 2016-06-16 DIAGNOSIS — M1712 Unilateral primary osteoarthritis, left knee: Secondary | ICD-10-CM | POA: Diagnosis not present

## 2016-06-17 ENCOUNTER — Ambulatory Visit
Admission: RE | Admit: 2016-06-17 | Discharge: 2016-06-17 | Disposition: A | Discharge status: Home / Self Care | Payer: Medicare Other | Source: Ambulatory Visit | Attending: Physical Medicine & Rehabilitation | Admitting: Physical Medicine & Rehabilitation

## 2016-06-17 DIAGNOSIS — M5416 Radiculopathy, lumbar region: Secondary | ICD-10-CM

## 2016-06-17 DIAGNOSIS — M898X8 Other specified disorders of bone, other site: Secondary | ICD-10-CM | POA: Diagnosis not present

## 2016-06-17 DIAGNOSIS — M5126 Other intervertebral disc displacement, lumbar region: Secondary | ICD-10-CM | POA: Diagnosis not present

## 2016-07-06 DIAGNOSIS — M94262 Chondromalacia, left knee: Secondary | ICD-10-CM | POA: Diagnosis not present

## 2016-07-09 DIAGNOSIS — M47816 Spondylosis without myelopathy or radiculopathy, lumbar region: Secondary | ICD-10-CM | POA: Diagnosis not present

## 2016-07-27 DIAGNOSIS — G43909 Migraine, unspecified, not intractable, without status migrainosus: Secondary | ICD-10-CM | POA: Diagnosis not present

## 2016-07-29 DIAGNOSIS — M545 Low back pain: Secondary | ICD-10-CM | POA: Diagnosis not present

## 2016-08-16 DIAGNOSIS — Z23 Encounter for immunization: Secondary | ICD-10-CM | POA: Diagnosis not present

## 2016-08-30 DIAGNOSIS — M549 Dorsalgia, unspecified: Secondary | ICD-10-CM | POA: Diagnosis not present

## 2016-08-30 DIAGNOSIS — Z87411 Personal history of vaginal dysplasia: Secondary | ICD-10-CM | POA: Diagnosis not present

## 2016-08-30 DIAGNOSIS — G8911 Acute pain due to trauma: Secondary | ICD-10-CM | POA: Diagnosis not present

## 2016-08-30 DIAGNOSIS — J218 Acute bronchiolitis due to other specified organisms: Secondary | ICD-10-CM | POA: Diagnosis not present

## 2016-08-31 ENCOUNTER — Emergency Department: Payer: Medicare Other

## 2016-08-31 ENCOUNTER — Encounter: Payer: Self-pay | Admitting: *Deleted

## 2016-08-31 ENCOUNTER — Emergency Department
Admission: EM | Admit: 2016-08-31 | Discharge: 2016-08-31 | Disposition: A | Discharge status: Home / Self Care | Payer: Medicare Other | Attending: Emergency Medicine | Admitting: Emergency Medicine

## 2016-08-31 DIAGNOSIS — J45909 Unspecified asthma, uncomplicated: Secondary | ICD-10-CM | POA: Insufficient documentation

## 2016-08-31 DIAGNOSIS — R102 Pelvic and perineal pain: Secondary | ICD-10-CM

## 2016-08-31 DIAGNOSIS — N839 Noninflammatory disorder of ovary, fallopian tube and broad ligament, unspecified: Secondary | ICD-10-CM | POA: Insufficient documentation

## 2016-08-31 DIAGNOSIS — F17210 Nicotine dependence, cigarettes, uncomplicated: Secondary | ICD-10-CM | POA: Insufficient documentation

## 2016-08-31 DIAGNOSIS — N946 Dysmenorrhea, unspecified: Secondary | ICD-10-CM

## 2016-08-31 DIAGNOSIS — Z7982 Long term (current) use of aspirin: Secondary | ICD-10-CM | POA: Diagnosis not present

## 2016-08-31 DIAGNOSIS — N83202 Unspecified ovarian cyst, left side: Secondary | ICD-10-CM | POA: Diagnosis not present

## 2016-08-31 DIAGNOSIS — N83201 Unspecified ovarian cyst, right side: Secondary | ICD-10-CM | POA: Diagnosis not present

## 2016-08-31 DIAGNOSIS — N838 Other noninflammatory disorders of ovary, fallopian tube and broad ligament: Secondary | ICD-10-CM

## 2016-08-31 DIAGNOSIS — Z79899 Other long term (current) drug therapy: Secondary | ICD-10-CM | POA: Diagnosis not present

## 2016-08-31 DIAGNOSIS — R1031 Right lower quadrant pain: Secondary | ICD-10-CM | POA: Diagnosis not present

## 2016-08-31 LAB — COMPREHENSIVE METABOLIC PANEL
ALT: 14 U/L (ref 14–54)
ANION GAP: 7 (ref 5–15)
AST: 23 U/L (ref 15–41)
Albumin: 3.9 g/dL (ref 3.5–5.0)
Alkaline Phosphatase: 73 U/L (ref 38–126)
BUN: 8 mg/dL (ref 6–20)
CHLORIDE: 107 mmol/L (ref 101–111)
CO2: 25 mmol/L (ref 22–32)
Calcium: 9.1 mg/dL (ref 8.9–10.3)
Creatinine, Ser: 0.67 mg/dL (ref 0.44–1.00)
GFR calc non Af Amer: >60 mL/min (ref >60)
Glucose, Bld: 92 mg/dL (ref 65–99)
Potassium: 4.2 mmol/L (ref 3.5–5.1)
SODIUM: 139 mmol/L (ref 135–145)
Total Bilirubin: 0.1 mg/dL — ABNORMAL LOW (ref 0.3–1.2)
Total Protein: 8 g/dL (ref 6.5–8.1)

## 2016-08-31 LAB — CBC
HEMATOCRIT: 36.3 % (ref 35.0–47.0)
HEMOGLOBIN: 12.7 g/dL (ref 12.0–16.0)
MCH: 32.9 pg (ref 26.0–34.0)
MCHC: 35 g/dL (ref 32.0–36.0)
MCV: 94 fL (ref 80.0–100.0)
Platelets: 226 10*3/uL (ref 150–440)
RBC: 3.86 MIL/uL (ref 3.80–5.20)
RDW: 13.4 % (ref 11.5–14.5)
WBC: 7.8 10*3/uL (ref 3.6–11.0)

## 2016-08-31 LAB — RAPID HIV SCREEN (HIV 1/2 AB+AG)
HIV 1/2 Antibodies: NONREACTIVE
HIV-1 P24 ANTIGEN - HIV24: NONREACTIVE

## 2016-08-31 LAB — URINALYSIS, COMPLETE (UACMP) WITH MICROSCOPIC
Bacteria, UA: NONE SEEN
Bilirubin Urine: NEGATIVE
Glucose, UA: NEGATIVE mg/dL
Ketones, ur: NEGATIVE mg/dL
Leukocytes, UA: NEGATIVE
Nitrite: NEGATIVE
PROTEIN: NEGATIVE mg/dL
SQUAMOUS EPITHELIAL / LPF: NONE SEEN
Specific Gravity, Urine: 1.006 (ref 1.005–1.030)
pH: 6 (ref 5.0–8.0)

## 2016-08-31 LAB — CHLAMYDIA/NGC RT PCR (ARMC ONLY)
Chlamydia Tr: NOT DETECTED
N GONORRHOEAE: NOT DETECTED

## 2016-08-31 LAB — PREGNANCY, URINE: PREG TEST UR: NEGATIVE

## 2016-08-31 LAB — LIPASE, BLOOD: LIPASE: 31 U/L (ref 11–51)

## 2016-08-31 MED ORDER — MORPHINE SULFATE (PF) 4 MG/ML IV SOLN
4.0000 mg | Freq: Once | INTRAVENOUS | Status: AC
  Administered 2016-08-31: 4 mg via INTRAVENOUS
  Filled 2016-08-31: qty 1

## 2016-08-31 MED ORDER — HYDROMORPHONE HCL 1 MG/ML IJ SOLN
1.0000 mg | Freq: Once | INTRAMUSCULAR | Status: AC
  Administered 2016-08-31: 1 mg via INTRAVENOUS
  Filled 2016-08-31: qty 1

## 2016-08-31 MED ORDER — MEDROXYPROGESTERONE ACETATE 10 MG PO TABS: 10.0000 mg | ORAL_TABLET | Freq: Every day | ORAL | 0 refills | Status: DC

## 2016-08-31 MED ORDER — KETOROLAC TROMETHAMINE 30 MG/ML IJ SOLN
30.0000 mg | Freq: Once | INTRAMUSCULAR | Status: AC
  Administered 2016-08-31: 30 mg via INTRAVENOUS
  Filled 2016-08-31: qty 1

## 2016-08-31 MED ORDER — MEDROXYPROGESTERONE ACETATE 10 MG PO TABS
10.0000 mg | ORAL_TABLET | Freq: Every day | ORAL | Status: DC
  Administered 2016-08-31: 10 mg via ORAL
  Filled 2016-08-31: qty 1

## 2016-08-31 MED ORDER — OXYCODONE HCL 5 MG PO TABS: 5.0000 mg | ORAL_TABLET | Freq: Three times a day (TID) | ORAL | 0 refills | Status: DC | PRN

## 2016-08-31 NOTE — ED Provider Notes (Signed)
[facility] Emergency Department Provider Note        Time seen: ----------------------------------------- 9:18 AM on 08/31/2016 -----------------------------------------    I have reviewed the triage vital signs and the nursing notes.   HISTORY  Chief Complaint Abdominal Pain    HPI Melissa [name] is a 41 y.o. female presents to ER for right sided abdominal pain after she sneezed and felt a pop in her right side yesterday. She's also had some blood in her urine and unusual vaginal bleeding. Patient states she is passing clots which is unusual for her. She denies fevers, chills, chest pain, shortness of breath, vomiting or diarrhea.   Past Medical History:  Diagnosis Date  . Anemia   . Asthma    as a young adult - not for many years  . Depression   . Headache    migraines  . Heart murmur    after child was born - has never had any problems  . Hidradenitis 2007  . Seizures (HCC)    as a baby     Patient Active Problem List   Diagnosis Date Noted  . DDD (degenerative disc disease), lumbar 03/25/2015    Past Surgical History:  Procedure Laterality Date  . APPENDECTOMY  1997  . AXILLARY HIDRADENITIS EXCISION Bilateral 2007  . CESAREAN SECTION    . CHOLECYSTECTOMY    . INGUINAL HIDRADENITIS EXCISION  06-18-14   left  . KIDNEY STONE SURGERY    . KNEE ARTHROSCOPY Left 12/29/2015   Procedure: ARTHROSCOPY KNEE;  Surgeon: Deeann SaintHoward Miller, MD;  Location: [facility] ORS;  Service: Orthopedics;  Laterality: Left;  . KNEE SURGERY Left 1997  . POSTERIOR LUMBAR FUSION N/A 03/25/2015   Procedure: POSTERIOR LUMBAR INTERBODY FUSION LUMBAR FOUR-FIVE;  Surgeon: Julio SicksHenry Pool, MD;  [facility];  Service: Neurosurgery;  Laterality: N/A;  . TUBAL LIGATION      Allergies Tylenol [acetaminophen]; Ultram [tramadol hcl]; Celebrex [celecoxib]; and Other  Social History Social History  Substance Use Topics  . Smoking status: Current Every Day Smoker    Packs/day:  0.50    Years: 10.00    Types: Cigarettes  . Smokeless tobacco: Never Used  . Alcohol use Yes     Comment: occassional beer    Review of Systems Constitutional: Negative for fever. Cardiovascular: Negative for chest pain. Respiratory: Negative for shortness of breath. Gastrointestinal: Positive for abdominal pain Genitourinary: Positive for vaginal bleeding, hematuria Musculoskeletal: Negative for back pain. Skin: Negative for rash. Neurological: Negative for headaches, focal weakness or numbness.  10-point ROS otherwise negative.  ____________________________________________   PHYSICAL EXAM:  VITAL SIGNS: ED Triage Vitals  Enc Vitals Group     BP 08/31/16 0836 131/71     Pulse Rate 08/31/16 0836 90     Resp 08/31/16 0836 18     Temp 08/31/16 0836 98.7 F (37.1 C)     Temp Source 08/31/16 0836 Oral     SpO2 08/31/16 0836 100 %     Weight 08/31/16 0836 195 lb (88.5 kg)     Height 08/31/16 0836 5\' 4"  (1.626 m)     Head Circumference --      Peak Flow --      Pain Score 08/31/16 0834 10     Pain Loc --      Pain Edu? --      Excl. in GC? --     Constitutional: Alert and oriented. Mild distress Eyes: Conjunctivae are normal. PERRL. Normal extraocular movements. ENT  Head: Normocephalic and atraumatic.   Nose: No congestion/rhinnorhea.   Mouth/Throat: Mucous membranes are moist.   Neck: No stridor. Cardiovascular: Normal rate, regular rhythm. No murmurs, rubs, or gallops. Respiratory: Normal respiratory effort without tachypnea nor retractions. Breath sounds are clear and equal bilaterally. No wheezes/rales/rhonchi. Gastrointestinal: Right lower quadrant tenderness, no rebound or guarding. Normal bowel sounds. Genitourinary: Musculoskeletal: Nontender with normal range of motion in all extremities. No lower extremity tenderness nor edema. Neurologic:  Normal speech and language. No gross focal neurologic deficits are appreciated.  Skin:  Skin is warm,  dry and intact. No rash noted. Psychiatric: Mood and affect are normal. Speech and behavior are normal.  ____________________________________________  ED COURSE:  Pertinent labs & imaging results that were available during my care of the patient were reviewed by me and considered in my medical decision making (see chart for details). Patient presents to ER in no distress with right lower quadrant pain. We will assess with labs and possibly imaging.   Procedures ____________________________________________   LABS (pertinent positives/negatives)  Labs Reviewed  COMPREHENSIVE METABOLIC PANEL - Abnormal; Notable for the following:       Result Value   Total Bilirubin 0.1 (*)    All other components within normal limits  URINALYSIS, COMPLETE (UACMP) WITH MICROSCOPIC - Abnormal; Notable for the following:    Color, Urine STRAW (*)    APPearance CLEAR (*)    Hgb urine dipstick MODERATE (*)    All other components within normal limits  CHLAMYDIA/NGC RT PCR ([facility] ONLY)  WET PREP, GENITAL  LIPASE, BLOOD  CBC  PREGNANCY, URINE  RAPID HIV SCREEN (HIV 1/2 AB+AG)    RADIOLOGY Images were viewed by me IMPRESSION: No acute findings in the abdomen or pelvis.  No renal or ureteral stones.  Prior cholecystectomy and appendectomy.  Small bilateral ovarian cysts. CT renal protocol/Pelvic ultrasound IMPRESSION: 1. 2.9 x 2.1 x 2.2 cm possible solid right ovarian lesion. Gynecologic evaluation suggested. Ovarian tumor cannot be excluded.  2. Simple bilateral ovarian cysts.  3. 2.5 cm uterine fibroid.  4. Small amount of free pelvic fluid .  ____________________________________________  FINAL ASSESSMENT AND PLAN  Flank pain, dysmenorrhea, ovarian cyst, solid right ovarian lesion  Plan: Patient with labs and imaging as dictated above. I have discussed the results with the patient at length and advised close outpatient GYN follow-up. She does have an abnormal-looking right  ovary. I suspect she had a ruptured ovarian cyst day. Her labs and urine are unremarkable. She will be placed on Provera for 10 days as well as pain medication and referred to GYN for follow-up.   Emily Filbert, MD   Note: This note was generated in part or whole with voice recognition software. Voice recognition is usually quite accurate but there are transcription errors that can and very often do occur. I apologize for any typographical errors that were not detected and corrected.     Emily Filbert, MD 08/31/16 [phone]

## 2016-08-31 NOTE — ED Triage Notes (Signed)
States she sneezed yesterday and felt a "pop" in her RLQ, states she is now urinating blood and has intense RLQ abd apin

## 2016-08-31 NOTE — ED Notes (Signed)
Patient transported to CT 

## 2016-09-02 ENCOUNTER — Ambulatory Visit: Payer: Medicare Other | Admitting: Anesthesiology

## 2016-09-02 ENCOUNTER — Encounter: Payer: Self-pay | Admitting: *Deleted

## 2016-09-02 ENCOUNTER — Observation Stay
Admission: RE | Admit: 2016-09-02 | Discharge: 2016-09-03 | Disposition: A | Discharge status: Home / Self Care | Payer: Medicare Other | Source: Ambulatory Visit | Attending: Obstetrics and Gynecology | Admitting: Obstetrics and Gynecology

## 2016-09-02 ENCOUNTER — Encounter
Admission: RE | Disposition: A | Discharge status: Home / Self Care | Payer: Self-pay | Source: Ambulatory Visit | Attending: Obstetrics and Gynecology

## 2016-09-02 DIAGNOSIS — Z79899 Other long term (current) drug therapy: Secondary | ICD-10-CM | POA: Insufficient documentation

## 2016-09-02 DIAGNOSIS — F17210 Nicotine dependence, cigarettes, uncomplicated: Secondary | ICD-10-CM | POA: Diagnosis not present

## 2016-09-02 DIAGNOSIS — N838 Other noninflammatory disorders of ovary, fallopian tube and broad ligament: Secondary | ICD-10-CM | POA: Insufficient documentation

## 2016-09-02 DIAGNOSIS — Q504 Embryonic cyst of fallopian tube: Secondary | ICD-10-CM | POA: Diagnosis not present

## 2016-09-02 DIAGNOSIS — N8301 Follicular cyst of right ovary: Secondary | ICD-10-CM | POA: Diagnosis not present

## 2016-09-02 DIAGNOSIS — N8311 Corpus luteum cyst of right ovary: Secondary | ICD-10-CM | POA: Insufficient documentation

## 2016-09-02 DIAGNOSIS — Z79891 Long term (current) use of opiate analgesic: Secondary | ICD-10-CM | POA: Insufficient documentation

## 2016-09-02 DIAGNOSIS — N83292 Other ovarian cyst, left side: Secondary | ICD-10-CM | POA: Diagnosis not present

## 2016-09-02 DIAGNOSIS — N83201 Unspecified ovarian cyst, right side: Secondary | ICD-10-CM | POA: Diagnosis not present

## 2016-09-02 DIAGNOSIS — R102 Pelvic and perineal pain: Principal | ICD-10-CM | POA: Insufficient documentation

## 2016-09-02 HISTORY — PX: LAPAROSCOPY: SHX197

## 2016-09-02 LAB — CBC
HCT: 34.7 % — ABNORMAL LOW (ref 35.0–47.0)
HEMOGLOBIN: 12 g/dL (ref 12.0–16.0)
MCH: 32.8 pg (ref 26.0–34.0)
MCHC: 34.7 g/dL (ref 32.0–36.0)
MCV: 94.6 fL (ref 80.0–100.0)
PLATELETS: 232 10*3/uL (ref 150–440)
RBC: 3.67 MIL/uL — AB (ref 3.80–5.20)
RDW: 13.2 % (ref 11.5–14.5)
WBC: 10.8 10*3/uL (ref 3.6–11.0)

## 2016-09-02 LAB — BASIC METABOLIC PANEL
ANION GAP: 7 (ref 5–15)
BUN: 11 mg/dL (ref 6–20)
CHLORIDE: 106 mmol/L (ref 101–111)
CO2: 23 mmol/L (ref 22–32)
Calcium: 9.2 mg/dL (ref 8.9–10.3)
Creatinine, Ser: 0.71 mg/dL (ref 0.44–1.00)
GFR calc Af Amer: >60 mL/min (ref >60)
Glucose, Bld: 86 mg/dL (ref 65–99)
POTASSIUM: 4.4 mmol/L (ref 3.5–5.1)
SODIUM: 136 mmol/L (ref 135–145)

## 2016-09-02 LAB — TYPE AND SCREEN
ABO/RH(D): A POS
ANTIBODY SCREEN: NEGATIVE

## 2016-09-02 LAB — POCT PREGNANCY, URINE: PREG TEST UR: NEGATIVE

## 2016-09-02 SURGERY — LAPAROSCOPY OPERATIVE
Anesthesia: General | Site: Abdomen | Laterality: Right | Wound class: Clean Contaminated

## 2016-09-02 MED ORDER — DEXAMETHASONE SODIUM PHOSPHATE 10 MG/ML IJ SOLN
INTRAMUSCULAR | Status: DC | PRN
  Administered 2016-09-02: 10 mg via INTRAVENOUS

## 2016-09-02 MED ORDER — DOCUSATE SODIUM 100 MG PO CAPS: 100.0000 mg | ORAL_CAPSULE | Freq: Every day | ORAL | 3 refills | Status: DC | PRN

## 2016-09-02 MED ORDER — ROCURONIUM BROMIDE 100 MG/10ML IV SOLN
INTRAVENOUS | Status: DC | PRN
  Administered 2016-09-02: 30 mg via INTRAVENOUS
  Administered 2016-09-02: 20 mg via INTRAVENOUS

## 2016-09-02 MED ORDER — FENTANYL CITRATE (PF) 100 MCG/2ML IJ SOLN
INTRAMUSCULAR | Status: AC
  Filled 2016-09-02: qty 2

## 2016-09-02 MED ORDER — LACTATED RINGERS IV SOLN
INTRAVENOUS | Status: DC
  Administered 2016-09-03: 04:00:00 via INTRAVENOUS

## 2016-09-02 MED ORDER — SIMETHICONE 80 MG PO CHEW: 80.0000 mg | CHEWABLE_TABLET | Freq: Four times a day (QID) | ORAL | Status: DC | PRN

## 2016-09-02 MED ORDER — HYDROMORPHONE HCL 1 MG/ML IJ SOLN
0.2000 mg | INTRAMUSCULAR | Status: DC | PRN
  Administered 2016-09-02 – 2016-09-03 (×2): 0.6 mg via INTRAVENOUS
  Filled 2016-09-02 (×2): qty 1

## 2016-09-02 MED ORDER — FENTANYL CITRATE (PF) 100 MCG/2ML IJ SOLN
25.0000 ug | INTRAMUSCULAR | Status: DC | PRN
  Administered 2016-09-02 (×4): 25 ug via INTRAVENOUS

## 2016-09-02 MED ORDER — CYCLOBENZAPRINE HCL 10 MG PO TABS
10.0000 mg | ORAL_TABLET | Freq: Three times a day (TID) | ORAL | Status: DC
  Administered 2016-09-03 (×2): 10 mg via ORAL
  Filled 2016-09-02 (×2): qty 1

## 2016-09-02 MED ORDER — ONDANSETRON HCL 4 MG/2ML IJ SOLN
INTRAMUSCULAR | Status: DC | PRN
  Administered 2016-09-02: 4 mg via INTRAVENOUS

## 2016-09-02 MED ORDER — DOCUSATE SODIUM 100 MG PO CAPS
100.0000 mg | ORAL_CAPSULE | Freq: Two times a day (BID) | ORAL | Status: DC
  Administered 2016-09-03: 100 mg via ORAL
  Filled 2016-09-02: qty 1

## 2016-09-02 MED ORDER — KETOROLAC TROMETHAMINE 30 MG/ML IJ SOLN: 30.0000 mg | Freq: Once | INTRAMUSCULAR | Status: DC

## 2016-09-02 MED ORDER — FENTANYL CITRATE (PF) 100 MCG/2ML IJ SOLN
INTRAMUSCULAR | Status: DC | PRN
  Administered 2016-09-02: 50 ug via INTRAVENOUS
  Administered 2016-09-02: 25 ug via INTRAVENOUS
  Administered 2016-09-02 (×2): 50 ug via INTRAVENOUS
  Administered 2016-09-02: 25 ug via INTRAVENOUS

## 2016-09-02 MED ORDER — DEXAMETHASONE SODIUM PHOSPHATE 10 MG/ML IJ SOLN
INTRAMUSCULAR | Status: AC
  Filled 2016-09-02: qty 1

## 2016-09-02 MED ORDER — ONDANSETRON HCL 4 MG/2ML IJ SOLN: 4.0000 mg | Freq: Four times a day (QID) | INTRAMUSCULAR | Status: DC | PRN

## 2016-09-02 MED ORDER — PROPOFOL 10 MG/ML IV BOLUS
INTRAVENOUS | Status: DC | PRN
  Administered 2016-09-02: 150 mg via INTRAVENOUS

## 2016-09-02 MED ORDER — ONDANSETRON HCL 4 MG/2ML IJ SOLN
INTRAMUSCULAR | Status: AC
  Filled 2016-09-02: qty 2

## 2016-09-02 MED ORDER — ONDANSETRON HCL 4 MG/2ML IJ SOLN: 4.0000 mg | Freq: Once | INTRAMUSCULAR | Status: DC | PRN

## 2016-09-02 MED ORDER — OXYCODONE-ACETAMINOPHEN 5-325 MG PO TABS
1.0000 | ORAL_TABLET | ORAL | Status: DC | PRN
  Administered 2016-09-03 (×2): 2 via ORAL
  Filled 2016-09-02 (×2): qty 2

## 2016-09-02 MED ORDER — BUPIVACAINE HCL 0.5 % IJ SOLN
INTRAMUSCULAR | Status: DC | PRN
  Administered 2016-09-02: 9 mL

## 2016-09-02 MED ORDER — PROPOFOL 10 MG/ML IV BOLUS
INTRAVENOUS | Status: AC
  Filled 2016-09-02: qty 20

## 2016-09-02 MED ORDER — LACTATED RINGERS IV SOLN
INTRAVENOUS | Status: DC
  Administered 2016-09-02 (×2): via INTRAVENOUS

## 2016-09-02 MED ORDER — BUPIVACAINE HCL (PF) 0.5 % IJ SOLN
INTRAMUSCULAR | Status: AC
  Filled 2016-09-02: qty 30

## 2016-09-02 MED ORDER — SUGAMMADEX SODIUM 200 MG/2ML IV SOLN
INTRAVENOUS | Status: DC | PRN
  Administered 2016-09-02: 180 mg via INTRAVENOUS

## 2016-09-02 MED ORDER — IBUPROFEN 800 MG PO TABS: 800.0000 mg | ORAL_TABLET | Freq: Three times a day (TID) | ORAL | 0 refills | Status: AC | PRN

## 2016-09-02 MED ORDER — LIDOCAINE HCL (CARDIAC) 20 MG/ML IV SOLN
INTRAVENOUS | Status: DC | PRN
  Administered 2016-09-02: 60 mg via INTRAVENOUS

## 2016-09-02 MED ORDER — OXYCODONE-ACETAMINOPHEN 5-325 MG PO TABS: 1.0000 | ORAL_TABLET | Freq: Four times a day (QID) | ORAL | 0 refills | Status: DC | PRN

## 2016-09-02 MED ORDER — ONDANSETRON HCL 4 MG PO TABS
4.0000 mg | ORAL_TABLET | Freq: Four times a day (QID) | ORAL | Status: DC | PRN
  Administered 2016-09-03: 4 mg via ORAL
  Filled 2016-09-02: qty 1

## 2016-09-02 MED ORDER — IBUPROFEN 800 MG PO TABS
800.0000 mg | ORAL_TABLET | Freq: Three times a day (TID) | ORAL | Status: DC | PRN
  Administered 2016-09-03: 800 mg via ORAL
  Filled 2016-09-02: qty 1

## 2016-09-02 MED ORDER — MIDAZOLAM HCL 5 MG/5ML IJ SOLN
INTRAMUSCULAR | Status: DC | PRN
  Administered 2016-09-02: 2 mg via INTRAVENOUS

## 2016-09-02 MED ORDER — METHYLENE BLUE 0.5 % INJ SOLN
INTRAVENOUS | Status: AC
  Filled 2016-09-02: qty 10

## 2016-09-02 MED ORDER — SUCCINYLCHOLINE CHLORIDE 20 MG/ML IJ SOLN
INTRAMUSCULAR | Status: DC | PRN
  Administered 2016-09-02: 100 mg via INTRAVENOUS

## 2016-09-02 MED ORDER — MENTHOL 3 MG MT LOZG
1.0000 | LOZENGE | OROMUCOSAL | Status: DC | PRN
  Filled 2016-09-02: qty 9

## 2016-09-02 MED ORDER — MIDAZOLAM HCL 2 MG/2ML IJ SOLN
INTRAMUSCULAR | Status: AC
  Filled 2016-09-02: qty 2

## 2016-09-02 SURGICAL SUPPLY — 40 items
BAG URO DRAIN 2000ML W/SPOUT (MISCELLANEOUS) ×3 IMPLANT
BLADE SURG SZ11 CARB STEEL (BLADE) ×3 IMPLANT
CATH FOLEY 2WAY  5CC 16FR (CATHETERS)
CATH PEDI SILICON 3CC 8FR (CATHETERS) ×3 IMPLANT
CATH ROBINSON RED A/P 16FR (CATHETERS) ×3 IMPLANT
CATH URTH 16FR FL 2W BLN LF (CATHETERS) IMPLANT
CHLORAPREP W/TINT 26ML (MISCELLANEOUS) ×3 IMPLANT
DRSG TEGADERM 2-3/8X2-3/4 SM (GAUZE/BANDAGES/DRESSINGS) IMPLANT
ENDOPOUCH RETRIEVER 10 (MISCELLANEOUS) ×3 IMPLANT
GAUZE SPONGE NON-WVN 2X2 STRL (MISCELLANEOUS) IMPLANT
GLOVE BIO SURGEON STRL SZ7 (GLOVE) ×12 IMPLANT
GLOVE INDICATOR 7.5 STRL GRN (GLOVE) ×12 IMPLANT
GOWN STRL REUS W/ TWL LRG LVL3 (GOWN DISPOSABLE) ×4 IMPLANT
GOWN STRL REUS W/TWL LRG LVL3 (GOWN DISPOSABLE) ×2
IRRIGATION STRYKERFLOW (MISCELLANEOUS) ×2 IMPLANT
IRRIGATOR STRYKERFLOW (MISCELLANEOUS) ×3
IV NS 1000ML (IV SOLUTION) ×1
IV NS 1000ML BAXH (IV SOLUTION) ×2 IMPLANT
KIT RM TURNOVER CYSTO AR (KITS) ×3 IMPLANT
LABEL OR SOLS (LABEL) ×3 IMPLANT
LIGASURE 5MM LAPAROSCOPIC (INSTRUMENTS) ×3 IMPLANT
LIGASURE LAP MARYLAND 5MM 37CM (ELECTROSURGICAL) ×3 IMPLANT
LIQUID BAND (GAUZE/BANDAGES/DRESSINGS) ×3 IMPLANT
NS IRRIG 500ML POUR BTL (IV SOLUTION) ×3 IMPLANT
PACK GYN LAPAROSCOPIC (MISCELLANEOUS) ×3 IMPLANT
PAD OB MATERNITY 4.3X12.25 (PERSONAL CARE ITEMS) ×3 IMPLANT
PAD PREP 24X41 OB/GYN DISP (PERSONAL CARE ITEMS) ×3 IMPLANT
SCISSORS METZENBAUM CVD 33 (INSTRUMENTS) ×3 IMPLANT
SLEEVE ENDOPATH XCEL 5M (ENDOMECHANICALS) ×3 IMPLANT
SPONGE VERSALON 2X2 STRL (MISCELLANEOUS)
STRIP CLOSURE SKIN 1/4X4 (GAUZE/BANDAGES/DRESSINGS) IMPLANT
SUT MNCRL AB 4-0 PS2 18 (SUTURE) ×3 IMPLANT
SUT VIC AB 2-0 UR6 27 (SUTURE) ×6 IMPLANT
SUT VIC AB 4-0 SH 27 (SUTURE)
SUT VIC AB 4-0 SH 27XANBCTRL (SUTURE) IMPLANT
SWABSTK COMLB BENZOIN TINCTURE (MISCELLANEOUS) ×3 IMPLANT
TROCAR ENDO BLADELESS 11MM (ENDOMECHANICALS) ×3 IMPLANT
TROCAR XCEL NON-BLD 5MMX100MML (ENDOMECHANICALS) ×3 IMPLANT
TROCAR XCEL UNIV SLVE 11M 100M (ENDOMECHANICALS) IMPLANT
TUBING INSUFFLATOR HI FLOW (MISCELLANEOUS) ×3 IMPLANT

## 2016-09-02 NOTE — Anesthesia Preprocedure Evaluation (Signed)
Anesthesia Evaluation  Patient identified by MRN, date of birth, ID band Patient awake    Reviewed: Allergy & Precautions, NPO status , Patient's Chart, lab work & pertinent test results  History of Anesthesia Complications Negative for: history of anesthetic complications  Airway Mallampati: II       Dental  (+) Chipped   Pulmonary asthma , Current Smoker,           Cardiovascular negative cardio ROS       Neuro/Psych Seizures - (as a baby), Well Controlled,  Depression    GI/Hepatic negative GI ROS, Neg liver ROS,   Endo/Other  negative endocrine ROS  Renal/GU negative Renal ROS     Musculoskeletal   Abdominal   Peds  Hematology  (+) anemia ,   Anesthesia Other Findings   Reproductive/Obstetrics                            Anesthesia Physical Anesthesia Plan  ASA: II and emergent  Anesthesia Plan: General   Post-op Pain Management:    Induction: Intravenous  Airway Management Planned: Nasal Cannula  Additional Equipment:   Intra-op Plan:   Post-operative Plan:   Informed Consent: I have reviewed the patients History and Physical, chart, labs and discussed the procedure including the risks, benefits and alternatives for the proposed anesthesia with the patient or authorized representative who has indicated his/her understanding and acceptance.     Plan Discussed with:   Anesthesia Plan Comments:         Anesthesia Quick Evaluation

## 2016-09-02 NOTE — Discharge Summary (Signed)
GynecologicalDischarge Summary  Patient Name: Melissa [name] DOB: 1975/08/01 MRN: [id number]  Date of Admission: 09/02/2016 Date of Discharge: 09/03/2016  Hospital course:   The patient was admitted on the day of scheduled surgery and proceeded to the operating room as scheduled for the below stated procedure without complications, EBL minimal.  (For complete operative information, please see operative report).  Postoperatively she was admitted to the floor. Postoperative labs were stable  Patient was felt to be stable for discharge on postoperative day number 1 when she was tolerating a regular diet, pain was controlled with po pain medications, and she was ambulating and voiding without difficulty. Vital signs were stable and physical exam remained benign throughout her hospital stay. Incision was clean,dry, and intact.  She will follow up per below for post-op check in 2 weeks.  Rx given for Percocet, Zofran, and Colace.    She was given specific instructions and numbers to call in written and verbal format. She verbalized understanding, agrees with the plan of care, and all questions answered to her satisfaction.  Discharge Physical Exam:  BP 122/69   Pulse 71   Temp 97.7 F (36.5 C) (Oral)   Resp 17   Ht 5\' 4"  (1.626 m)   Wt 195 lb (88.5 kg)   LMP  (LMP Unknown) Comment: upreg is negative  SpO2 100%   BMI 33.47 kg/m   General: NAD CV: RRR Pulm: CTABL, nl effort ABD: s/nd/nt Incision: c/d/i  DVT Evaluation: LE non-ttp, no evidence of DVT on exam.  Hemoglobin  Date Value Ref Range Status  09/03/2016 12.3 12.0 - 16.0 g/dL Final   HGB  Date Value Ref Range Status  10/22/2014 11.9 (L) 12.0 - 16.0 g/dL Final   HCT  Date Value Ref Range Status  09/03/2016 36.9 35.0 - 47.0 % Final  10/22/2014 35.7 35.0 - 47.0 % Final      Plan:  Melissa [name] was discharged to home in good condition. Follow-up appointment at [facility] in 2 weeks   Discharge  Medications: Allergies as of 09/03/2016      Reactions   Tylenol [acetaminophen] Itching   Throat itch and high fever (no reaction to hydrocodone with acetaminophen)   Ultram [tramadol Hcl] Nausea And Vomiting   Itching burning  (Can take Oxycodone)   Celebrex [celecoxib] Nausea And Vomiting   Other Rash   Powder in gloves causes rash      Medication List    STOP taking these medications   medroxyPROGESTERone 10 MG tablet Commonly known as:  PROVERA   oxyCODONE 5 MG immediate release tablet Commonly known as:  ROXICODONE     TAKE these medications   Aspirin-Caffeine 500-32.5 MG Tabs Take 1 tablet by mouth 2 (two) times daily.   cyclobenzaprine 10 MG tablet Commonly known as:  FLEXERIL Take 10 mg by mouth 3 (three) times daily.   diazepam 5 MG tablet Commonly known as:  VALIUM Take 1 tablet (5 mg total) by mouth every 8 (eight) hours as needed for muscle spasms.   diclofenac 75 MG EC tablet Commonly known as:  VOLTAREN Take 75 mg by mouth 2 (two) times daily.   docusate sodium 100 MG capsule Commonly known as:  COLACE Take 1 capsule (100 mg total) by mouth daily as needed for mild constipation.   gabapentin 400 MG capsule Commonly known as:  NEURONTIN Take 1 capsule (400 mg total) by mouth 3 (three) times daily.   ibuprofen 800 MG tablet Commonly known  as:  ADVIL,MOTRIN Take 800 mg by mouth every 8 (eight) hours as needed. What changed:  Another medication with the same name was added. Make sure you understand how and when to take each.   ibuprofen 800 MG tablet Commonly known as:  ADVIL,MOTRIN Take 1 tablet (800 mg total) by mouth every 8 (eight) hours as needed for moderate pain or cramping. What changed:  You were already taking a medication with the same name, and this prescription was added. Make sure you understand how and when to take each.   multivitamin tablet Take 1 tablet by mouth daily.   oxyCODONE-acetaminophen 5-325 MG tablet Commonly known as:   PERCOCET Take 1-2 tablets by mouth every 6 (six) hours as needed for severe pain.       Follow-up Information    Christeen DouglasBEASLEY, Nour Scalise, MD In 2 weeks.   Specialty:  Obstetrics and Gynecology Why:  For postop check Contact information: [address] [place] [address] [phone]           Signed: Christeen DouglasBEASLEY, Sylvia Helms, MD 10:05 AM

## 2016-09-02 NOTE — H&P (Signed)
41yo Melissa [name] presenting as an ER follow-up for acute pelvic pain.  4 days of acute pelvic pain after sneezing and feeling a pop.  It hurts to move, specifically hurts to lower her right leg down.  She has free fluid in her pelvis.  She also has a complex 2 cm right ovarian cyst.  She also has a 4 cm left sided simple cyst.  She has a history of bilateral tubal ligation.  Her urine pregnancy test was negative.  She is a half pack per day smoker. She has a hx of BTL, appendectomy, NSVD and C/S x1, as well as multiple knee surgeries, back surgery, and excision of hidradenitis.   She has had unusual vaginal bleeding, with a period that has now lasted for more than 2 weeks.  She is normally regular, every 23-28 days.  Large clots are present.  Cycles last 2-3 days.  She denies fevers, chest pain, shortness of breath, vomiting, or diarrhea.  She endorses chills, occasional dizziness when she arises suddenly, and significant abdominal tenderness. She is on no hormones.  Past Medical History:  has a past medical history of Asthma without status asthmaticus, unspecified and Chronic pain.  Past Surgical History:  has a past surgical history that includes Appendectomy; Cholecystectomy; BTL; Knee surgery (Left); I&D vulva; and I&D underarm. Family History: family history is not on file. Social History:  reports that she has been smoking.  She has never used smokeless tobacco. She reports that she drinks alcohol. She reports that she does not use drugs. OB/GYN History:          OB History    Gravida Para Term Preterm AB Living   8 5 5   3 5    SAB TAB Ectopic Molar Multiple Live Births                    Allergies: is allergic to tylenol [acetaminophen] and ultram [tramadol]. Medications:  Current Outpatient Prescriptions:  .  azithromycin (ZITHROMAX) 250 MG tablet, Take 250 mg by mouth as directed. Take 2 tablets (500mg ) by mouth on Day 1. Take 1 tablet (250mg ) by mouth on Days 2-5., Disp: ,  Rfl:  .  cyclobenzaprine (FLEXERIL) 10 MG tablet, Take 10 mg by mouth 3 (three) times daily.  , Disp: , Rfl:  .  GABAPENTIN ORAL, Take 300 mg by mouth. Takes 2 a day as needed , Disp: , Rfl:  .  ibuprofen (ADVIL,MOTRIN) 800 MG tablet, Take 800 mg by mouth every 8 (eight) hours as needed.  , Disp: , Rfl:  .  medroxyPROGESTERone (PROVERA) 10 MG tablet, Take 10 mg by mouth once daily., Disp: , Rfl:  .  oxyCODONE (ROXICODONE) 5 MG immediate release tablet, Take 5 mg by mouth every 8 (eight) hours.  , Disp: , Rfl:   Review of Systems: No SOB, no palpitations or chest pain, no new lower extremity edema, no vomiting or bowel or bladder complaints. See HPI for gyn specific ROS.    Exam:      Vitals:   09/02/16 1428  BP: 120/86  Pulse: 88   Body mass index is 32.28 kg/m.  WDWN female in NAD    Breast: deferred Neck:  no thyromegaly Abdomen: soft , positive for guarding, positive for Rovsing sign, positive for peritoneal signs.  No CVA tenderness. Skin: No rashes, ulcers or skin lesions noted. No excessive hirsutism or acne noted.  Neurological: Appears alert and oriented and is a good historian. No gross abnormalities  are noted. Psychological: Normal affect and mood. No signs of anxiety or depression noted.   Pelvic:              External genitalia: vulva /labia no lesions, Tanner stage 5             Urethra: no prolapse             Vagina: normal physiologic d/c-significant right sided tenderness on exam.             Cervix: no lesions, no cervical motion tenderness               Uterus: normal size shape and contour, non-tender             Adnexa: no mass, significantly-tender on the right             Rectovaginal: external exam normal    Impression:   The primary encounter diagnosis was Pelvic pain in female. Diagnoses of Ovarian cyst, complex and Ovarian cyst, right were also pertinent to this visit.    Plan:   -Ovarian cyst: A 2 cm complex cyst is not  necessarily concerning.  I gave the patient the option of repeating an ultrasound in 4 weeks and getting a CA 125 now.  We discussed the possibility of ovarian cancer, which is unlikely in a 41 year old patient, but not impossible.  It may be a hemorrhagic cyst, a dermoid cyst, less likely a cystadenoma.  Acute pelvic pain: Her exam today was fairly impressive.  Therefore, I have recommended a diagnostic laparoscopy.  Differential diagnosis includes ruptured hemorrhagic cyst, ruptured follicular cyst, ovarian torsion, less likely hydrosalpinx.  She has had an appendectomy in the past.  We will plan to proceed with surgical treatment of her acute pelvic pain by diagnostic laparoscopy with evacuation of peritoneal fluid procedure and right oophorectomy.  She states that she has had a tubal ligation in the past, but questions whether she may have had an ectopic pregnancy 18 months ago, as 1 of her doctors told her that that was likely the cause of heavy bleeding.  The patient and I discussed the technical aspects of the procedure including the potential for risks and complications. These include but are not limited to the risk of infection requiring post-operative antibiotics or further procedures. We talked about the risk of injury to adjacent organs including bladder, bowel, ureter, blood vessels or nerves. We talked about the need to convert to an open incision. We talked about the possible need for blood transfusion. We talked aboutpostop complications such asthromboembolic or cardiopulmonary complications. All of her questions were answered.  Her preoperative exam was completed and the appropriate consents were signed. She is scheduled to undergo this procedure in the near future.  Specific Peri-operative Considerations:  - Consent: obtained today - Labs: CBC, CMP preoperatively - Bowel Preparation: None required - VTE ppx: SCDs perioperatively  Since it is so late in the day, I plan to  keep her overnight for postoperative pain management.  We will give her a nicotine patch.  I plan to discharge her home tomorrow.  Return in about 2 weeks (around 09/16/2016) for Postop check.  Cecilie KicksBETHANY EVANGELINE Reagan Klemz, MD

## 2016-09-02 NOTE — Transfer of Care (Signed)
Immediate Anesthesia Transfer of Care Note  Patient: Melissa [name]  Procedure(s) Performed: Procedure(s): LAPAROSCOPY OPERATIVE (N/A) LAPAROSCOPIC OOPHORECTOMY (Right)  Patient Location: PACU  Anesthesia Type:General  Level of Consciousness: awake  Airway & Oxygen Therapy: Patient Spontanous Breathing  Post-op Assessment: Post -op Vital signs reviewed and stable  Post vital signs: stable  Last Vitals:  Vitals:   09/02/16 2015 09/02/16 2223  BP:  122/82  Pulse: 69   Resp: 14 12  Temp:  36.9 C    Last Pain:  Vitals:   09/02/16 2223  TempSrc: Temporal  PainSc:          Complications: No apparent anesthesia complications

## 2016-09-02 NOTE — OR Nursing (Signed)
Pt brought into room at 1908. Started prepping for surgery. CNRA gave fentanyl and versed.while  waiting for Anesthesiologist to return to OR when STAT CSECTION was called, being the only emergency team in house, pt was returned to holding area to be monitored. In the mean time csection was cancelled. Case repicked for pt. Pt returned to room for scheduled surgery with Dr. Dalbert GarnetBeasley.  Nursing supervisor and OR Directors made aware of delay in case.

## 2016-09-02 NOTE — Discharge Instructions (Signed)
Laparoscopic Ovarian Surgery Discharge Instructions ° °RISKS AND COMPLICATIONS  °· Infection. °· Bleeding. °· Injury to surrounding organs. °· Anesthetic side effects. ° ° °PROCEDURE  °· You may be given a medicine to help you relax (sedative) before the procedure. You will be given a medicine to make you sleep (general anesthetic) during the procedure. °· A tube will be put down your throat to help your breath while under general anesthesia. °· Several small cuts (incisions) are made in the lower abdominal area and one incision is made near the belly button. °· Your abdominal area will be inflated with a safe gas (carbon dioxide). This helps give the surgeon room to operate, visualize, and helps the surgeon avoid other organs. °· A thin, lighted tube (laparoscope) with a camera attached is inserted into your abdomen through the incision near the belly button. Other small instruments may also be inserted through other abdominal incisions. °· The ovary is located and are removed. °· After the ovary is removed, the gas is released from the abdomen. °· The incisions will be closed with stitches (sutures), and Dermabond. A bandage may be placed over the incisions. ° °AFTER THE PROCEDURE  °· You will also have some mild abdominal discomfort for 3-7 days. You will be given pain medicine to ease any discomfort. °· As long as there are no problems, you may be allowed to go home. Someone will need to drive you home and be with you for at least 24 hours once home. °· You may have some mild discomfort in the throat. This is from the tube placed in your throat while you were sleeping. °· You may experience discomfort in the shoulder area from some trapped air between the liver and diaphragm. This sensation is normal and will slowly go away on its own. ° °HOME CARE INSTRUCTIONS  °· Take all medicines as directed. °· Only take over-the-counter or prescription medicines for pain, discomfort, or fever as directed by your  caregiver. °· Resume daily activities as directed. °· Showers are preferred over baths for 2 weeks. °· You may resume sexual activities in 1 week or as you feel you would like to. °· Do not drive while taking narcotics. ° °SEEK MEDICAL CARE IF: . °· There is increasing abdominal pain. °· You feel lightheaded or faint. °· You have the chills. °· You have an oral temperature above 102° F (38.9° C). °· There is pus-like (purulent) drainage from any of the wounds. °· You are unable to pass gas or have a bowel movement. °· You feel sick to your stomach (nauseous) or throw up (vomit) and can't control it with your medicines. ° °MAKE SURE YOU:  °· Understand these instructions. °· Will watch your condition. °· Will get help right away if you are not doing well or get worse. ° °[employer]® Patient Information ©2013 [employer]. ° ° ° ° ° °

## 2016-09-02 NOTE — Op Note (Signed)
Melissa [name] PROCEDURE DATE: 09/02/2016  PREOPERATIVE DIAGNOSIS: Acute pelvic pain; complex right ovarian cyst POSTOPERATIVE DIAGNOSIS: Same PROCEDURE: Diagnostic laparoscopy, evacuation of intraperitoneal fluid: Right salpingo-oophorectomy; removal of left fallopian tube fimbriae. Drainage of left simple ovarian cyst SURGEON:  Dr. Christeen DouglasBethany Shikira Folino ANESTHESIOLOGIST: Naomie DeanWilliam K Kephart, MD Anesthesiologist: Naomie DeanWilliam K Kephart, MD CRNA: Irving BurtonJennifer Bachich, CRNA  INDICATIONS: 41 y.o. [id number] with history of acute pelvic pain and complex right ovarian cyst desiring surgical evaluation.   Please see preoperative notes for further details.  Risks of surgery were discussed with the patient including but not limited to: bleeding which may require transfusion or reoperation; infection which may require antibiotics; injury to bowel, bladder, ureters or other surrounding organs; need for additional procedures including laparotomy; thromboembolic phenomenon, incisional problems and other postoperative/anesthesia complications. Written informed consent was obtained.    FINDINGS:  Small uterus, left simple ovarian cyst with clear straw-colored fluid. Small right ovary with complex cysts, no excrescences or adhesions. Bilateral fallopian tubes with minimal mid tubal disruption from prior BTL. Surgical staples in the left cul-de-sac that were removed.  Adhesions between the omentum and the left upper quadrant. Absent appendix.  ANESTHESIA:    General INTRAVENOUS FLUIDS: 12 ml ESTIMATED BLOOD LOSS: minimal URINE OUTPUT: 20 ml SPECIMENS: Right ovary and bilateral tubes COMPLICATIONS: None immediate  PROCEDURE IN DETAIL:  The patient had sequential compression devices applied to her lower extremities while in the preoperative area.  She was then taken to the operating room where general anesthesia was administered and was found to be adequate.  She was placed in the dorsal lithotomy position, and was prepped and  draped in a sterile manner.  A Foley catheter was inserted into her bladder and attached to constant drainage and a uterine manipulator was then advanced into the uterus .  After an adequate timeout was performed, attention was turned to the abdomen where an umbilical incision was made with the scalpel.  The Optiview 5-mm trocar and sleeve were then advanced without difficulty with the laparoscope under direct visualization into the abdomen.  The abdomen was then insufflated with carbon dioxide gas and adequate pneumoperitoneum was obtained.   A detailed survey of the patient's pelvis and abdomen revealed the findings as mentioned above.  An additional 5mm trocar was placed in the right lower quadrant under direct visualization, and at 10mm trocar was placed in the opposite quadrant under direct visualization.   Evaluation of the pelvic sidewalls to observe the course of the ureters was undertaken, assuring the ureter was outside the operative field.   Right oopherectomy The Right adnexa was elevated away from the pelvic sidewall. Using a Ligasure electrocautery device, the IP ligament was skeletonized, triply clamped, cauterized and transected to remove the ovary and fallopian tube complex in its entirety, being careful not to spill the contents of the ovarian cyst.  The operative site was surveyed, and it was found to be hemostatic.  No intraoperative injury to surrounding organs was noted. The fascia of the 10 mm trocar site was closed with 0 Vicryl. Some bleeding was noted from the site that was [facility] knotted with Foley catheter. A figure-of-eight was placed in the fascia here.  Pictures were taken of the quadrants and pelvis. The abdomen was desufflated and all instruments were then removed from the patient's abdomen. The uterine manipulator was removed without complications.  All incisions were closed with 4-0 Vicryl and Dermabond.   The patient tolerated the procedures well.  All instruments,  needles, and sponge  counts were correct x 2. The patient was taken to the recovery room in stable condition.

## 2016-09-02 NOTE — Anesthesia Post-op Follow-up Note (Cosign Needed)
Anesthesia QCDR form completed.        

## 2016-09-02 NOTE — Anesthesia Procedure Notes (Signed)
Procedure Name: Intubation Date/Time: 09/02/2016 8:33 PM Performed by: Aline Brochure Pre-anesthesia Checklist: Patient identified, Emergency Drugs available, Suction available and Patient being monitored Patient Re-evaluated:Patient Re-evaluated prior to inductionOxygen Delivery Method: Circle system utilized Preoxygenation: Pre-oxygenation with 100% oxygen Intubation Type: IV induction Ventilation: Mask ventilation without difficulty Laryngoscope Size: Mac and 3 Grade View: Grade II Tube type: Oral Tube size: 7.0 mm Number of attempts: 1 Airway Equipment and Method: Stylet Placement Confirmation: positive ETCO2 and breath sounds checked- equal and bilateral Secured at: 21 cm Tube secured with: Tape Dental Injury: Teeth and Oropharynx as per pre-operative assessment

## 2016-09-03 ENCOUNTER — Encounter: Payer: Self-pay | Admitting: Obstetrics and Gynecology

## 2016-09-03 DIAGNOSIS — R102 Pelvic and perineal pain: Secondary | ICD-10-CM | POA: Diagnosis not present

## 2016-09-03 DIAGNOSIS — F17210 Nicotine dependence, cigarettes, uncomplicated: Secondary | ICD-10-CM | POA: Diagnosis not present

## 2016-09-03 DIAGNOSIS — Z79899 Other long term (current) drug therapy: Secondary | ICD-10-CM | POA: Diagnosis not present

## 2016-09-03 DIAGNOSIS — N8311 Corpus luteum cyst of right ovary: Secondary | ICD-10-CM | POA: Diagnosis not present

## 2016-09-03 DIAGNOSIS — Z79891 Long term (current) use of opiate analgesic: Secondary | ICD-10-CM | POA: Diagnosis not present

## 2016-09-03 DIAGNOSIS — N838 Other noninflammatory disorders of ovary, fallopian tube and broad ligament: Secondary | ICD-10-CM | POA: Diagnosis not present

## 2016-09-03 LAB — CBC
HEMATOCRIT: 36.9 % (ref 35.0–47.0)
Hemoglobin: 12.3 g/dL (ref 12.0–16.0)
MCH: 31.8 pg (ref 26.0–34.0)
MCHC: 33.4 g/dL (ref 32.0–36.0)
MCV: 95 fL (ref 80.0–100.0)
PLATELETS: 230 10*3/uL (ref 150–440)
RBC: 3.89 MIL/uL (ref 3.80–5.20)
RDW: 12.8 % (ref 11.5–14.5)
WBC: 9.8 10*3/uL (ref 3.6–11.0)

## 2016-09-03 LAB — BASIC METABOLIC PANEL
ANION GAP: 6 (ref 5–15)
BUN: 9 mg/dL (ref 6–20)
CALCIUM: 8.9 mg/dL (ref 8.9–10.3)
CO2: 23 mmol/L (ref 22–32)
CREATININE: 0.67 mg/dL (ref 0.44–1.00)
Chloride: 104 mmol/L (ref 101–111)
Glucose, Bld: 136 mg/dL — ABNORMAL HIGH (ref 65–99)
Potassium: 4.4 mmol/L (ref 3.5–5.1)
SODIUM: 133 mmol/L — AB (ref 135–145)

## 2016-09-03 MED ORDER — OXYCODONE-ACETAMINOPHEN 5-325 MG PO TABS: 1.0000 | ORAL_TABLET | Freq: Four times a day (QID) | ORAL | 0 refills | Status: DC | PRN

## 2016-09-03 NOTE — Progress Notes (Signed)
Patient discharge to home via wheelchairDischarge instructions reviewed with patient.  All questions answered.  Follow up appointment scheduled.

## 2016-09-03 NOTE — Anesthesia Postprocedure Evaluation (Signed)
Anesthesia Post Note  Patient: Henrietta Dineheresa A Dun  Procedure(s) Performed: Procedure(s) (LRB): LAPAROSCOPY OPERATIVE (N/A) LAPAROSCOPIC OOPHORECTOMY (Right)  Patient location during evaluation: PACU Anesthesia Type: General Level of consciousness: awake and alert Pain management: pain level controlled Vital Signs Assessment: post-procedure vital signs reviewed and stable Respiratory status: spontaneous breathing and respiratory function stable Cardiovascular status: stable Anesthetic complications: no     Last Vitals:  Vitals:   09/02/16 2320 09/02/16 2323  BP: 128/71 (!) 122/94  Pulse: 69 73  Resp:  20  Temp: 36.6 C     Last Pain:  Vitals:   09/03/16 0115  TempSrc:   PainSc: 6                  KEPHART,WILLIAM K

## 2016-09-06 LAB — SURGICAL PATHOLOGY

## 2016-09-09 DIAGNOSIS — G8918 Other acute postprocedural pain: Secondary | ICD-10-CM | POA: Diagnosis not present

## 2016-09-09 DIAGNOSIS — R1915 Other abnormal bowel sounds: Secondary | ICD-10-CM | POA: Diagnosis not present

## 2016-09-09 DIAGNOSIS — R109 Unspecified abdominal pain: Secondary | ICD-10-CM | POA: Diagnosis not present

## 2016-09-24 DIAGNOSIS — G8929 Other chronic pain: Secondary | ICD-10-CM | POA: Diagnosis not present

## 2016-09-24 DIAGNOSIS — M545 Low back pain: Secondary | ICD-10-CM | POA: Diagnosis not present

## 2016-09-24 DIAGNOSIS — M549 Dorsalgia, unspecified: Secondary | ICD-10-CM | POA: Diagnosis not present

## 2016-09-24 DIAGNOSIS — S93439A Sprain of tibiofibular ligament of unspecified ankle, initial encounter: Secondary | ICD-10-CM | POA: Diagnosis not present

## 2016-10-08 DIAGNOSIS — M1712 Unilateral primary osteoarthritis, left knee: Secondary | ICD-10-CM | POA: Diagnosis not present

## 2016-10-13 DIAGNOSIS — M2392 Unspecified internal derangement of left knee: Secondary | ICD-10-CM | POA: Diagnosis not present

## 2016-10-14 ENCOUNTER — Other Ambulatory Visit: Payer: Self-pay | Admitting: Orthopedic Surgery

## 2016-10-14 DIAGNOSIS — M2392 Unspecified internal derangement of left knee: Secondary | ICD-10-CM

## 2016-10-20 ENCOUNTER — Ambulatory Visit
Admission: RE | Admit: 2016-10-20 | Discharge: 2016-10-20 | Disposition: A | Discharge status: Home / Self Care | Payer: Medicare Other | Source: Ambulatory Visit | Attending: Orthopedic Surgery | Admitting: Orthopedic Surgery

## 2016-10-20 DIAGNOSIS — M23252 Derangement of posterior horn of lateral meniscus due to old tear or injury, left knee: Secondary | ICD-10-CM | POA: Insufficient documentation

## 2016-10-20 DIAGNOSIS — M23222 Derangement of posterior horn of medial meniscus due to old tear or injury, left knee: Secondary | ICD-10-CM | POA: Insufficient documentation

## 2016-10-20 DIAGNOSIS — M2392 Unspecified internal derangement of left knee: Secondary | ICD-10-CM | POA: Diagnosis present

## 2016-10-20 DIAGNOSIS — M25462 Effusion, left knee: Secondary | ICD-10-CM | POA: Diagnosis not present

## 2016-10-20 DIAGNOSIS — M1712 Unilateral primary osteoarthritis, left knee: Secondary | ICD-10-CM | POA: Insufficient documentation

## 2016-10-20 DIAGNOSIS — S83242A Other tear of medial meniscus, current injury, left knee, initial encounter: Secondary | ICD-10-CM | POA: Diagnosis not present

## 2016-11-03 DIAGNOSIS — R079 Chest pain, unspecified: Secondary | ICD-10-CM | POA: Diagnosis not present

## 2016-11-04 ENCOUNTER — Encounter: Payer: Self-pay | Admitting: Emergency Medicine

## 2016-11-04 ENCOUNTER — Emergency Department
Admission: EM | Admit: 2016-11-04 | Discharge: 2016-11-04 | Disposition: A | Discharge status: Home / Self Care | Payer: Medicare Other | Attending: Emergency Medicine | Admitting: Emergency Medicine

## 2016-11-04 DIAGNOSIS — R4182 Altered mental status, unspecified: Secondary | ICD-10-CM | POA: Diagnosis present

## 2016-11-04 DIAGNOSIS — F10921 Alcohol use, unspecified with intoxication delirium: Secondary | ICD-10-CM

## 2016-11-04 DIAGNOSIS — Z79899 Other long term (current) drug therapy: Secondary | ICD-10-CM | POA: Diagnosis not present

## 2016-11-04 DIAGNOSIS — R0789 Other chest pain: Secondary | ICD-10-CM | POA: Diagnosis not present

## 2016-11-04 DIAGNOSIS — F10121 Alcohol abuse with intoxication delirium: Secondary | ICD-10-CM | POA: Diagnosis not present

## 2016-11-04 DIAGNOSIS — F17210 Nicotine dependence, cigarettes, uncomplicated: Secondary | ICD-10-CM | POA: Diagnosis not present

## 2016-11-04 DIAGNOSIS — J45909 Unspecified asthma, uncomplicated: Secondary | ICD-10-CM | POA: Insufficient documentation

## 2016-11-04 DIAGNOSIS — R079 Chest pain, unspecified: Secondary | ICD-10-CM

## 2016-11-04 LAB — COMPREHENSIVE METABOLIC PANEL
ALBUMIN: 4 g/dL (ref 3.5–5.0)
ALK PHOS: 82 U/L (ref 38–126)
ALT: 11 U/L — AB (ref 14–54)
AST: 19 U/L (ref 15–41)
Anion gap: 12 (ref 5–15)
BUN: 10 mg/dL (ref 6–20)
CALCIUM: 9.1 mg/dL (ref 8.9–10.3)
CO2: 23 mmol/L (ref 22–32)
CREATININE: 0.77 mg/dL (ref 0.44–1.00)
Chloride: 107 mmol/L (ref 101–111)
GFR calc non Af Amer: >60 mL/min (ref >60)
Glucose, Bld: 87 mg/dL (ref 65–99)
Potassium: 3.5 mmol/L (ref 3.5–5.1)
SODIUM: 142 mmol/L (ref 135–145)
Total Bilirubin: 0.4 mg/dL (ref 0.3–1.2)
Total Protein: 8.1 g/dL (ref 6.5–8.1)

## 2016-11-04 LAB — URINE DRUG SCREEN, QUALITATIVE (ARMC ONLY)
Amphetamines, Ur Screen: NOT DETECTED
Barbiturates, Ur Screen: NOT DETECTED
Benzodiazepine, Ur Scrn: POSITIVE — AB
CANNABINOID 50 NG, UR ~~LOC~~: NOT DETECTED
COCAINE METABOLITE, UR ~~LOC~~: NOT DETECTED
MDMA (ECSTASY) UR SCREEN: NOT DETECTED
Methadone Scn, Ur: NOT DETECTED
Opiate, Ur Screen: NOT DETECTED
PHENCYCLIDINE (PCP) UR S: NOT DETECTED
Tricyclic, Ur Screen: POSITIVE — AB

## 2016-11-04 LAB — CBC
HEMATOCRIT: 38.1 % (ref 35.0–47.0)
Hemoglobin: 13.1 g/dL (ref 12.0–16.0)
MCH: 32.8 pg (ref 26.0–34.0)
MCHC: 34.3 g/dL (ref 32.0–36.0)
MCV: 95.7 fL (ref 80.0–100.0)
Platelets: 224 10*3/uL (ref 150–440)
RBC: 3.98 MIL/uL (ref 3.80–5.20)
RDW: 13.3 % (ref 11.5–14.5)
WBC: 11.2 10*3/uL — ABNORMAL HIGH (ref 3.6–11.0)

## 2016-11-04 LAB — TROPONIN I: Troponin I: <0.03 ng/mL (ref <0.03)

## 2016-11-04 LAB — ETHANOL: Alcohol, Ethyl (B): 190 mg/dL — ABNORMAL HIGH (ref <5)

## 2016-11-04 LAB — POCT PREGNANCY, URINE: Preg Test, Ur: NEGATIVE

## 2016-11-04 MED ORDER — DIPHENHYDRAMINE HCL 50 MG/ML IJ SOLN
25.0000 mg | Freq: Once | INTRAMUSCULAR | Status: AC
  Administered 2016-11-04: 25 mg via INTRAVENOUS

## 2016-11-04 MED ORDER — DIPHENHYDRAMINE HCL 50 MG/ML IJ SOLN
INTRAMUSCULAR | Status: AC
  Administered 2016-11-04: 25 mg via INTRAVENOUS
  Filled 2016-11-04: qty 1

## 2016-11-04 MED ORDER — ONDANSETRON HCL 4 MG/2ML IJ SOLN
4.0000 mg | Freq: Once | INTRAMUSCULAR | Status: AC
  Administered 2016-11-04: 4 mg via INTRAVENOUS
  Filled 2016-11-04: qty 2

## 2016-11-04 NOTE — ED Triage Notes (Addendum)
Pt presents to ED 19 via EMS c/o burning chest pain at 10/10; pt has a rash on the left side of the chest; pt states wanting to keep scratching it; pt appears intoxicated; pt is not aware of surrounding or her situation; per EMS pt was found in her bathroom with her daddy holding her; pt repeatedly stating "my heart is broken"; pt is also crying intermittently. Per EMS pt's VS were BP 150/87, HR 86, O2 99% on RA, EKG was NSR, CBG 99. Per EMS pt stated having a heart attack 3 years ago;

## 2016-11-04 NOTE — ED Notes (Signed)
Pt awake, getting herself dressed, family at bedside, pt given water, awake and alert, discharge reviewed

## 2016-11-04 NOTE — ED Notes (Signed)
No change in condition will continue to monitor  

## 2016-11-04 NOTE — ED Provider Notes (Signed)
[facility] Emergency Department Provider Note _   First MD Initiated Contact with Patient 11/04/16 [phone]     (approximate)  I have reviewed the triage vital signs and the nursing notes.  History Limited secondary to altered mental status HISTORY  Chief Complaint Chest Pain    HPI Melissa [name] is a 41 y.o. female with bolus of chronic medical conditions presents to the emergency department via EMS with 10 out of 10 burning left chest pain and altered mental status. Per EMS patient was found to bathroom floor being held by her father as she was agitated and screaming "my heart is broken". Patient crying on arrival to the emergency department stating the same and scratching her left chest repetitively. Patient does admit to EtOH ingestion tonight. Patient states that she had an argument with her husband and "my heart is broken". Patient appears intoxicated.   Past Medical History:  Diagnosis Date  . Anemia   . Asthma    as a young adult - not for many years  . Depression   . Headache    migraines  . Heart murmur    after child was born - has never had any problems  . Hidradenitis 2007  . Seizures (HCC)    as a baby     Patient Active Problem List   Diagnosis Date Noted  . Acute pelvic pain, female 09/02/2016  . DDD (degenerative disc disease), lumbar 03/25/2015    Past Surgical History:  Procedure Laterality Date  . APPENDECTOMY  1997  . AXILLARY HIDRADENITIS EXCISION Bilateral 2007  . CESAREAN SECTION    . CHOLECYSTECTOMY    . INGUINAL HIDRADENITIS EXCISION  06-18-14   left  . KIDNEY STONE SURGERY    . KNEE ARTHROSCOPY Left 12/29/2015   Procedure: ARTHROSCOPY KNEE;  Surgeon: Deeann Saint, MD;  Location: [facility] ORS;  Service: Orthopedics;  Laterality: Left;  . KNEE SURGERY Left 1997  . LAPAROSCOPY N/A 09/02/2016   Procedure: LAPAROSCOPY OPERATIVE;  Surgeon: Christeen Douglas, MD;  Location: [facility] ORS;  Service: Gynecology;  Laterality: N/A;  .  POSTERIOR LUMBAR FUSION N/A 03/25/2015   Procedure: POSTERIOR LUMBAR INTERBODY FUSION LUMBAR FOUR-FIVE;  Surgeon: Julio Sicks, MD;  Location: [facility];  Service: Neurosurgery;  Laterality: N/A;  . TUBAL LIGATION      Prior to Admission medications   Medication Sig Start Date End Date Taking? Authorizing Provider  Aspirin-Caffeine 500-32.5 MG TABS Take 1 tablet by mouth 2 (two) times daily.    Historical Provider, MD  cyclobenzaprine (FLEXERIL) 10 MG tablet Take 10 mg by mouth 3 (three) times daily. 07/29/16   Historical Provider, MD  diazepam (VALIUM) 5 MG tablet Take 1 tablet (5 mg total) by mouth every 8 (eight) hours as needed for muscle spasms. Patient not taking: Reported on 08/31/2016 12/03/15   Emily Filbert, MD  diclofenac (VOLTAREN) 75 MG EC tablet Take 75 mg by mouth 2 (two) times daily.    Historical Provider, MD  docusate sodium (COLACE) 100 MG capsule Take 1 capsule (100 mg total) by mouth daily as needed for mild constipation. 09/02/16   Christeen Douglas, MD  gabapentin (NEURONTIN) 400 MG capsule Take 1 capsule (400 mg total) by mouth 3 (three) times daily. 12/29/15   Deeann Saint, MD  ibuprofen (ADVIL,MOTRIN) 800 MG tablet Take 800 mg by mouth every 8 (eight) hours as needed.    Historical Provider, MD  Multiple Vitamin (MULTIVITAMIN) tablet Take 1 tablet by mouth daily.  Historical Provider, MD  oxyCODONE-acetaminophen (PERCOCET) 5-325 MG tablet Take 1-2 tablets by mouth every 6 (six) hours as needed for severe pain. 09/03/16   Christeen DouglasBethany Beasley, MD    Allergies Tylenol [acetaminophen]; Ultram [tramadol hcl]; Celebrex [celecoxib]; and Other  Family History  Problem Relation Age of Onset  . Diabetes Mother   . Hypertension Mother     Social History Social History  Substance Use Topics  . Smoking status: Current Every Day Smoker    Packs/day: 0.50    Years: 10.00    Types: Cigarettes  . Smokeless tobacco: Never Used  . Alcohol use Yes     Comment: occassional beer     Review of Systems Constitutional: No fever/chills Eyes: No visual changes. ENT: No sore throat. Cardiovascular: Positive for chest pain. Respiratory: Denies shortness of breath. Gastrointestinal: No abdominal pain.  No nausea, no vomiting.  No diarrhea.  No constipation. Genitourinary: Negative for dysuria. Musculoskeletal: Negative for back pain. Integumentary: Negative for rash. Neurological: Negative for headaches, focal weakness or numbness.   ____________________________________________   PHYSICAL EXAM:  VITAL SIGNS: ED Triage Vitals  Enc Vitals Group     BP 11/04/16 0130 117/86     Pulse Rate 11/04/16 0117 84     Resp 11/04/16 0117 18     Temp 11/04/16 0117 97.7 F (36.5 C)     Temp Source 11/04/16 0117 Oral     SpO2 11/04/16 0117 98 %     Weight 11/04/16 0125 191 lb (86.6 kg)     Height 11/04/16 0125 5\' 4"  (1.626 m)     Head Circumference --      Peak Flow --      Pain Score 11/04/16 0117 10     Pain Loc --      Pain Edu? --      Excl. in GC? --     Constitutional: Alert, agitated, EtOH on breath.  Eyes: Conjunctivae are normal. PERRL. EOMI. Head: Atraumatic. Mouth/Throat: Mucous membranes are moist. Oropharynx non-erythematous. Neck: No stridor.   Cardiovascular: Normal rate, regular rhythm. Good peripheral circulation. Grossly normal heart sounds. Respiratory: Normal respiratory effort.  No retractions. Lungs CTAB. Gastrointestinal: Soft and nontender. No distention.  Musculoskeletal: No lower extremity tenderness nor edema. No gross deformities of extremities. Neurologic:  Normal speech and language. No gross focal neurologic deficits are appreciated.  Skin:  Multiple excoriation noted left anterior chest wall. Psychiatric: Bizarre affect of oh (appears intoxicated).Marland Kitchen. Speech and behavior are normal.  ____________________________________________   LABS (all labs ordered are listed, but only abnormal results are displayed)  Labs Reviewed  CBC -  Abnormal; Notable for the following:       Result Value   WBC 11.2 (*)    All other components within normal limits  COMPREHENSIVE METABOLIC PANEL - Abnormal; Notable for the following:    ALT 11 (*)    All other components within normal limits  ETHANOL - Abnormal; Notable for the following:    Alcohol, Ethyl (B) 190 (*)    All other components within normal limits  URINE DRUG SCREEN, QUALITATIVE ([facility] ONLY) - Abnormal; Notable for the following:    Tricyclic, Ur Screen POSITIVE (*)    Benzodiazepine, Ur Scrn POSITIVE (*)    All other components within normal limits  TROPONIN I  POCT PREGNANCY, URINE   ____________________________________________  EKG  ED ECG REPORT I, Dixie N BROWN, the attending physician, personally viewed and interpreted this ECG.   Date: 11/05/2016  EKG Time: 1:21  AM  Rate: 87  Rhythm: Normal sinus rhythm  Axis: Normal  Intervals: Normal  ST&T Change: None  Procedures   ____________________________________________   INITIAL IMPRESSION / ASSESSMENT AND PLAN / ED COURSE  Pertinent labs & imaging results that were available during my care of the patient were reviewed by me and considered in my medical decision making (see chart for details).  41 year old female presents with altered mental status and complaint of burning chest pain patient repetitively scratching her left chest wall. Multiple areas of excoriation noted on the anterior left chest wall. EtOH noted on breath was reason for EtOH level being ordered. Patient's alcohol level noted to be 190. In addition patient noted to have benzodiazepine in the urine. Patient denies taking any benzodiazepine (Xanax Ativan etc.).  Patient reevaluated at 6:30 AM patient still denies taking any benzodiazepine. She does admit to having 3 shots of alcohol last night. Patient will be discharged home and her family's custody       ____________________________________________  FINAL CLINICAL IMPRESSION(S)  / ED DIAGNOSES  Final diagnoses:  Alcohol intoxication with delirium (HCC)  Chest pain, unspecified type     MEDICATIONS GIVEN DURING THIS VISIT:  Medications  ondansetron (ZOFRAN) injection 4 mg (4 mg Intravenous Given 11/04/16 0159)  diphenhydrAMINE (BENADRYL) injection 25 mg (25 mg Intravenous Given 11/04/16 0124)     NEW OUTPATIENT MEDICATIONS STARTED DURING THIS VISIT:  Discharge Medication List as of 11/04/2016  6:59 AM      Discharge Medication List as of 11/04/2016  6:59 AM      Discharge Medication List as of 11/04/2016  6:59 AM       Note:  This document was prepared using Dragon voice recognition software and may include unintentional dictation errors.    Darci Current, MD 11/05/16 [phone]

## 2016-11-04 NOTE — ED Notes (Signed)
Report received from Kenny RN. Patient care assumed. Patient/RN introduction complete. Will continue to monitor.  

## 2016-11-04 NOTE — ED Notes (Signed)
Pt resting in bed, eyes closed, resp even and unlabored

## 2016-11-04 NOTE — ED Notes (Signed)
Pt resting comfortably in bed with no distress noted, family at bedside.

## 2016-11-04 NOTE — ED Notes (Signed)
Pt's family at bedside; pt appears upset, pt is crying and repeatedly stating "my heart is broken, fix it"

## 2016-11-04 NOTE — ED Notes (Signed)
Pt easier aroused has no complaints at this time. Reoriented to to place and situation, pt calm and cooperative.

## 2016-11-04 NOTE — ED Notes (Signed)
Family went home, will return when patient wakes up and is ready for discharge.

## 2016-11-08 DIAGNOSIS — M2342 Loose body in knee, left knee: Secondary | ICD-10-CM | POA: Diagnosis not present

## 2016-11-08 DIAGNOSIS — S83242A Other tear of medial meniscus, current injury, left knee, initial encounter: Secondary | ICD-10-CM | POA: Diagnosis not present

## 2016-11-08 DIAGNOSIS — S83282A Other tear of lateral meniscus, current injury, left knee, initial encounter: Secondary | ICD-10-CM | POA: Diagnosis not present

## 2016-11-08 DIAGNOSIS — M948X6 Other specified disorders of cartilage, lower leg: Secondary | ICD-10-CM | POA: Diagnosis not present

## 2016-11-08 DIAGNOSIS — M2392 Unspecified internal derangement of left knee: Secondary | ICD-10-CM | POA: Diagnosis not present

## 2016-11-08 DIAGNOSIS — M238X2 Other internal derangements of left knee: Secondary | ICD-10-CM | POA: Diagnosis not present

## 2016-12-31 DIAGNOSIS — L853 Xerosis cutis: Secondary | ICD-10-CM | POA: Diagnosis not present

## 2016-12-31 DIAGNOSIS — L299 Pruritus, unspecified: Secondary | ICD-10-CM | POA: Diagnosis not present

## 2016-12-31 DIAGNOSIS — G8911 Acute pain due to trauma: Secondary | ICD-10-CM | POA: Diagnosis not present

## 2016-12-31 DIAGNOSIS — Z87411 Personal history of vaginal dysplasia: Secondary | ICD-10-CM | POA: Diagnosis not present

## 2017-01-12 DIAGNOSIS — L986 Other infiltrative disorders of the skin and subcutaneous tissue: Secondary | ICD-10-CM | POA: Diagnosis not present

## 2017-01-12 DIAGNOSIS — L271 Localized skin eruption due to drugs and medicaments taken internally: Secondary | ICD-10-CM | POA: Diagnosis not present

## 2017-01-12 DIAGNOSIS — R21 Rash and other nonspecific skin eruption: Secondary | ICD-10-CM | POA: Diagnosis not present

## 2017-01-19 DIAGNOSIS — M1712 Unilateral primary osteoarthritis, left knee: Secondary | ICD-10-CM | POA: Diagnosis not present

## 2017-01-27 DIAGNOSIS — L270 Generalized skin eruption due to drugs and medicaments taken internally: Secondary | ICD-10-CM | POA: Diagnosis not present

## 2017-02-17 DIAGNOSIS — Z01812 Encounter for preprocedural laboratory examination: Secondary | ICD-10-CM | POA: Diagnosis not present

## 2017-02-17 DIAGNOSIS — M1712 Unilateral primary osteoarthritis, left knee: Secondary | ICD-10-CM | POA: Diagnosis not present

## 2017-02-21 DIAGNOSIS — M1712 Unilateral primary osteoarthritis, left knee: Secondary | ICD-10-CM | POA: Diagnosis not present

## 2017-02-21 DIAGNOSIS — M5136 Other intervertebral disc degeneration, lumbar region: Secondary | ICD-10-CM | POA: Diagnosis not present

## 2017-02-21 DIAGNOSIS — Z87891 Personal history of nicotine dependence: Secondary | ICD-10-CM | POA: Diagnosis not present

## 2017-02-21 DIAGNOSIS — Z01818 Encounter for other preprocedural examination: Secondary | ICD-10-CM | POA: Diagnosis not present

## 2017-02-24 DIAGNOSIS — F17210 Nicotine dependence, cigarettes, uncomplicated: Secondary | ICD-10-CM | POA: Diagnosis not present

## 2017-02-24 DIAGNOSIS — F329 Major depressive disorder, single episode, unspecified: Secondary | ICD-10-CM | POA: Diagnosis not present

## 2017-02-24 DIAGNOSIS — M25562 Pain in left knee: Secondary | ICD-10-CM | POA: Diagnosis not present

## 2017-02-24 DIAGNOSIS — G40909 Epilepsy, unspecified, not intractable, without status epilepticus: Secondary | ICD-10-CM | POA: Diagnosis not present

## 2017-02-24 DIAGNOSIS — D649 Anemia, unspecified: Secondary | ICD-10-CM | POA: Diagnosis not present

## 2017-02-24 DIAGNOSIS — F419 Anxiety disorder, unspecified: Secondary | ICD-10-CM | POA: Diagnosis not present

## 2017-02-24 DIAGNOSIS — G8918 Other acute postprocedural pain: Secondary | ICD-10-CM | POA: Diagnosis not present

## 2017-02-24 DIAGNOSIS — M1712 Unilateral primary osteoarthritis, left knee: Secondary | ICD-10-CM | POA: Diagnosis not present

## 2017-02-24 DIAGNOSIS — J45909 Unspecified asthma, uncomplicated: Secondary | ICD-10-CM | POA: Diagnosis not present

## 2017-02-24 DIAGNOSIS — M94262 Chondromalacia, left knee: Secondary | ICD-10-CM | POA: Diagnosis not present

## 2017-02-28 DIAGNOSIS — R609 Edema, unspecified: Secondary | ICD-10-CM | POA: Diagnosis not present

## 2017-02-28 DIAGNOSIS — M25562 Pain in left knee: Secondary | ICD-10-CM | POA: Diagnosis not present

## 2017-02-28 DIAGNOSIS — M25662 Stiffness of left knee, not elsewhere classified: Secondary | ICD-10-CM | POA: Diagnosis not present

## 2017-03-03 DIAGNOSIS — M25562 Pain in left knee: Secondary | ICD-10-CM | POA: Diagnosis not present

## 2017-03-03 DIAGNOSIS — M25662 Stiffness of left knee, not elsewhere classified: Secondary | ICD-10-CM | POA: Diagnosis not present

## 2017-03-03 DIAGNOSIS — R609 Edema, unspecified: Secondary | ICD-10-CM | POA: Diagnosis not present

## 2017-03-08 DIAGNOSIS — R609 Edema, unspecified: Secondary | ICD-10-CM | POA: Diagnosis not present

## 2017-03-08 DIAGNOSIS — M25662 Stiffness of left knee, not elsewhere classified: Secondary | ICD-10-CM | POA: Diagnosis not present

## 2017-03-08 DIAGNOSIS — M25562 Pain in left knee: Secondary | ICD-10-CM | POA: Diagnosis not present

## 2017-03-09 DIAGNOSIS — Z4889 Encounter for other specified surgical aftercare: Secondary | ICD-10-CM | POA: Diagnosis not present

## 2017-03-09 DIAGNOSIS — M25562 Pain in left knee: Secondary | ICD-10-CM | POA: Diagnosis not present

## 2017-03-28 DIAGNOSIS — M94262 Chondromalacia, left knee: Secondary | ICD-10-CM | POA: Diagnosis not present

## 2017-03-28 DIAGNOSIS — R609 Edema, unspecified: Secondary | ICD-10-CM | POA: Diagnosis not present

## 2017-03-28 DIAGNOSIS — M25662 Stiffness of left knee, not elsewhere classified: Secondary | ICD-10-CM | POA: Diagnosis not present

## 2017-03-28 DIAGNOSIS — M25562 Pain in left knee: Secondary | ICD-10-CM | POA: Diagnosis not present

## 2017-04-01 ENCOUNTER — Other Ambulatory Visit: Payer: Self-pay | Admitting: Orthopedic Surgery

## 2017-04-01 DIAGNOSIS — Z96652 Presence of left artificial knee joint: Secondary | ICD-10-CM

## 2017-04-08 ENCOUNTER — Encounter: Payer: Self-pay | Admitting: *Deleted

## 2017-04-08 ENCOUNTER — Ambulatory Visit
Admission: RE | Admit: 2017-04-08 | Discharge: 2017-04-08 | Disposition: A | Discharge status: Home / Self Care | Payer: Medicare Other | Source: Ambulatory Visit | Attending: Orthopedic Surgery | Admitting: Orthopedic Surgery

## 2017-04-08 DIAGNOSIS — M25462 Effusion, left knee: Secondary | ICD-10-CM | POA: Insufficient documentation

## 2017-04-08 DIAGNOSIS — M23222 Derangement of posterior horn of medial meniscus due to old tear or injury, left knee: Secondary | ICD-10-CM | POA: Diagnosis not present

## 2017-04-08 DIAGNOSIS — M25562 Pain in left knee: Secondary | ICD-10-CM | POA: Diagnosis not present

## 2017-04-08 DIAGNOSIS — Z96652 Presence of left artificial knee joint: Secondary | ICD-10-CM

## 2017-05-23 DIAGNOSIS — R0789 Other chest pain: Secondary | ICD-10-CM | POA: Diagnosis not present

## 2017-05-23 DIAGNOSIS — R079 Chest pain, unspecified: Secondary | ICD-10-CM | POA: Diagnosis not present

## 2017-05-23 DIAGNOSIS — M67911 Unspecified disorder of synovium and tendon, right shoulder: Secondary | ICD-10-CM | POA: Diagnosis not present

## 2017-05-23 DIAGNOSIS — M67912 Unspecified disorder of synovium and tendon, left shoulder: Secondary | ICD-10-CM | POA: Diagnosis not present

## 2017-05-31 ENCOUNTER — Other Ambulatory Visit: Payer: Self-pay | Admitting: Internal Medicine

## 2017-05-31 ENCOUNTER — Ambulatory Visit
Admission: RE | Admit: 2017-05-31 | Discharge: 2017-05-31 | Disposition: A | Discharge status: Home / Self Care | Payer: Medicare Other | Source: Ambulatory Visit | Attending: Cardiology | Admitting: Cardiology

## 2017-05-31 ENCOUNTER — Ambulatory Visit
Admission: RE | Admit: 2017-05-31 | Discharge: 2017-05-31 | Disposition: A | Discharge status: Home / Self Care | Payer: Medicare Other | Source: Ambulatory Visit | Attending: Internal Medicine | Admitting: Internal Medicine

## 2017-05-31 DIAGNOSIS — M25512 Pain in left shoulder: Secondary | ICD-10-CM | POA: Insufficient documentation

## 2017-05-31 DIAGNOSIS — R079 Chest pain, unspecified: Secondary | ICD-10-CM | POA: Insufficient documentation

## 2017-06-06 DIAGNOSIS — G8929 Other chronic pain: Secondary | ICD-10-CM | POA: Diagnosis not present

## 2017-06-06 DIAGNOSIS — R51 Headache: Secondary | ICD-10-CM | POA: Diagnosis not present

## 2017-06-06 DIAGNOSIS — M545 Low back pain: Secondary | ICD-10-CM | POA: Diagnosis not present

## 2017-06-06 DIAGNOSIS — R079 Chest pain, unspecified: Secondary | ICD-10-CM | POA: Diagnosis not present

## 2017-06-06 DIAGNOSIS — R0789 Other chest pain: Secondary | ICD-10-CM | POA: Diagnosis not present

## 2017-06-08 DIAGNOSIS — M25562 Pain in left knee: Secondary | ICD-10-CM | POA: Diagnosis not present

## 2017-06-09 DIAGNOSIS — F17200 Nicotine dependence, unspecified, uncomplicated: Secondary | ICD-10-CM | POA: Diagnosis not present

## 2017-06-09 DIAGNOSIS — M545 Low back pain: Secondary | ICD-10-CM | POA: Diagnosis not present

## 2017-06-09 DIAGNOSIS — R079 Chest pain, unspecified: Secondary | ICD-10-CM | POA: Diagnosis not present

## 2017-06-09 DIAGNOSIS — R51 Headache: Secondary | ICD-10-CM | POA: Diagnosis not present

## 2017-06-15 ENCOUNTER — Emergency Department
Admission: EM | Admit: 2017-06-15 | Discharge: 2017-06-15 | Disposition: A | Discharge status: Home / Self Care | Payer: Medicare Other | Attending: Emergency Medicine | Admitting: Emergency Medicine

## 2017-06-15 ENCOUNTER — Encounter: Payer: Self-pay | Admitting: Intensive Care

## 2017-06-15 ENCOUNTER — Emergency Department: Payer: Medicare Other

## 2017-06-15 DIAGNOSIS — R1084 Generalized abdominal pain: Secondary | ICD-10-CM | POA: Diagnosis not present

## 2017-06-15 DIAGNOSIS — N83202 Unspecified ovarian cyst, left side: Secondary | ICD-10-CM | POA: Diagnosis not present

## 2017-06-15 DIAGNOSIS — Z79899 Other long term (current) drug therapy: Secondary | ICD-10-CM | POA: Insufficient documentation

## 2017-06-15 DIAGNOSIS — R1030 Lower abdominal pain, unspecified: Secondary | ICD-10-CM | POA: Diagnosis not present

## 2017-06-15 DIAGNOSIS — K2900 Acute gastritis without bleeding: Secondary | ICD-10-CM | POA: Diagnosis not present

## 2017-06-15 DIAGNOSIS — Z79891 Long term (current) use of opiate analgesic: Secondary | ICD-10-CM | POA: Diagnosis not present

## 2017-06-15 DIAGNOSIS — F17210 Nicotine dependence, cigarettes, uncomplicated: Secondary | ICD-10-CM | POA: Insufficient documentation

## 2017-06-15 DIAGNOSIS — J45909 Unspecified asthma, uncomplicated: Secondary | ICD-10-CM | POA: Insufficient documentation

## 2017-06-15 DIAGNOSIS — R109 Unspecified abdominal pain: Secondary | ICD-10-CM | POA: Diagnosis present

## 2017-06-15 DIAGNOSIS — G894 Chronic pain syndrome: Secondary | ICD-10-CM | POA: Diagnosis not present

## 2017-06-15 DIAGNOSIS — Z791 Long term (current) use of non-steroidal anti-inflammatories (NSAID): Secondary | ICD-10-CM | POA: Insufficient documentation

## 2017-06-15 DIAGNOSIS — M25562 Pain in left knee: Secondary | ICD-10-CM | POA: Diagnosis not present

## 2017-06-15 LAB — CBC
HCT: 37.1 % (ref 35.0–47.0)
Hemoglobin: 12.6 g/dL (ref 12.0–16.0)
MCH: 32.1 pg (ref 26.0–34.0)
MCHC: 34 g/dL (ref 32.0–36.0)
MCV: 94.5 fL (ref 80.0–100.0)
PLATELETS: 194 10*3/uL (ref 150–440)
RBC: 3.93 MIL/uL (ref 3.80–5.20)
RDW: 13.4 % (ref 11.5–14.5)
WBC: 11 10*3/uL (ref 3.6–11.0)

## 2017-06-15 LAB — COMPREHENSIVE METABOLIC PANEL
ALBUMIN: 4 g/dL (ref 3.5–5.0)
ALK PHOS: 107 U/L (ref 38–126)
ALT: 23 U/L (ref 14–54)
AST: 62 U/L — ABNORMAL HIGH (ref 15–41)
Anion gap: 7 (ref 5–15)
BILIRUBIN TOTAL: 0.6 mg/dL (ref 0.3–1.2)
BUN: 10 mg/dL (ref 6–20)
CALCIUM: 8.9 mg/dL (ref 8.9–10.3)
CO2: 22 mmol/L (ref 22–32)
Chloride: 106 mmol/L (ref 101–111)
Creatinine, Ser: 0.63 mg/dL (ref 0.44–1.00)
GFR calc Af Amer: >60 mL/min (ref >60)
GLUCOSE: 87 mg/dL (ref 65–99)
POTASSIUM: 3.9 mmol/L (ref 3.5–5.1)
SODIUM: 135 mmol/L (ref 135–145)
TOTAL PROTEIN: 7.8 g/dL (ref 6.5–8.1)

## 2017-06-15 LAB — HCG, QUANTITATIVE, PREGNANCY

## 2017-06-15 LAB — LIPASE, BLOOD: LIPASE: 25 U/L (ref 11–51)

## 2017-06-15 MED ORDER — ONDANSETRON 4 MG PO TBDP
4.0000 mg | ORAL_TABLET | Freq: Once | ORAL | Status: AC | PRN
  Administered 2017-06-15: 4 mg via ORAL
  Filled 2017-06-15: qty 1

## 2017-06-15 MED ORDER — KETOROLAC TROMETHAMINE 30 MG/ML IJ SOLN
15.0000 mg | Freq: Once | INTRAMUSCULAR | Status: AC
  Administered 2017-06-15: 15 mg via INTRAVENOUS
  Filled 2017-06-15: qty 1

## 2017-06-15 MED ORDER — GI COCKTAIL ~~LOC~~
30.0000 mL | ORAL | Status: AC
  Administered 2017-06-15: 30 mL via ORAL
  Filled 2017-06-15: qty 30

## 2017-06-15 MED ORDER — SODIUM CHLORIDE 0.9 % IV BOLUS (SEPSIS)
1000.0000 mL | Freq: Once | INTRAVENOUS | Status: AC
  Administered 2017-06-15: 1000 mL via INTRAVENOUS

## 2017-06-15 MED ORDER — ALUMINUM-MAGNESIUM-SIMETHICONE 200-200-20 MG/5ML PO SUSP: 30.0000 mL | Freq: Three times a day (TID) | ORAL | 0 refills | Status: DC

## 2017-06-15 MED ORDER — METOCLOPRAMIDE HCL 10 MG PO TABS
10.0000 mg | ORAL_TABLET | Freq: Once | ORAL | Status: AC
  Administered 2017-06-15: 10 mg via ORAL
  Filled 2017-06-15: qty 1

## 2017-06-15 MED ORDER — FAMOTIDINE 20 MG PO TABS
40.0000 mg | ORAL_TABLET | Freq: Once | ORAL | Status: AC
  Administered 2017-06-15: 40 mg via ORAL
  Filled 2017-06-15: qty 2

## 2017-06-15 MED ORDER — PANTOPRAZOLE SODIUM 40 MG IV SOLR
40.0000 mg | Freq: Once | INTRAVENOUS | Status: AC
  Administered 2017-06-15: 40 mg via INTRAVENOUS
  Filled 2017-06-15: qty 40

## 2017-06-15 MED ORDER — IOPAMIDOL (ISOVUE-300) INJECTION 61%
100.0000 mL | Freq: Once | INTRAVENOUS | Status: AC | PRN
  Administered 2017-06-15: 100 mL via INTRAVENOUS

## 2017-06-15 MED ORDER — METOCLOPRAMIDE HCL 10 MG PO TABS: 10.0000 mg | ORAL_TABLET | Freq: Four times a day (QID) | ORAL | 0 refills | Status: DC | PRN

## 2017-06-15 MED ORDER — FAMOTIDINE 20 MG PO TABS: 20.0000 mg | ORAL_TABLET | Freq: Two times a day (BID) | ORAL | 0 refills | Status: DC

## 2017-06-15 MED ORDER — ONDANSETRON HCL 4 MG/2ML IJ SOLN
4.0000 mg | Freq: Once | INTRAMUSCULAR | Status: AC
  Administered 2017-06-15: 4 mg via INTRAVENOUS
  Filled 2017-06-15: qty 2

## 2017-06-15 NOTE — ED Triage Notes (Signed)
Patient arrived by POV for abdominal pain with nausea/vomiting. Denies diarrhea. Patient states she is able to eat but experiencing abdominal pain when eating.

## 2017-06-15 NOTE — ED Provider Notes (Signed)
[facility] Emergency Department Provider Note  ____________________________________________  Time seen: Approximately 8:34 PM  I have reviewed the triage vital signs and the nursing notes.   HISTORY  Chief Complaint Abdominal Pain    HPI Melissa [name] is a 41 y.o. female who complains of generalized abdominal pain with nausea and vomiting that started earlier today. Constant, worse with eating, no alleviating factors. Nonradiating. Severe. Sharp and crampy. Has been taking a lot of Excedrin for her migraine headaches.     Past Medical History:  Diagnosis Date  . Anemia   . Asthma    as a young adult - not for many years  . Depression   . Headache    migraines  . Heart murmur    after child was born - has never had any problems  . Hidradenitis 2007  . Seizures (HCC)    as a baby      Patient Active Problem List   Diagnosis Date Noted  . Acute pelvic pain, female 09/02/2016  . DDD (degenerative disc disease), lumbar 03/25/2015     Past Surgical History:  Procedure Laterality Date  . APPENDECTOMY  1997  . AXILLARY HIDRADENITIS EXCISION Bilateral 2007  . CESAREAN SECTION    . CHOLECYSTECTOMY    . INGUINAL HIDRADENITIS EXCISION  06-18-14   left  . KIDNEY STONE SURGERY    . KNEE ARTHROSCOPY Left 12/29/2015   Procedure: ARTHROSCOPY KNEE;  Surgeon: Deeann SaintHoward Miller, MD;  Location: [facility] ORS;  Service: Orthopedics;  Laterality: Left;  . KNEE SURGERY Left 1997  . LAPAROSCOPY N/A 09/02/2016   Procedure: LAPAROSCOPY OPERATIVE;  Surgeon: Christeen DouglasBethany Beasley, MD;  Location: [facility] ORS;  Service: Gynecology;  Laterality: N/A;  . POSTERIOR LUMBAR FUSION N/A 03/25/2015   Procedure: POSTERIOR LUMBAR INTERBODY FUSION LUMBAR FOUR-FIVE;  Surgeon: Julio SicksHenry Pool, MD;  [facility];  Service: Neurosurgery;  Laterality: N/A;  . TUBAL LIGATION       Prior to Admission medications   Medication Sig Start Date End Date Taking? Authorizing Provider  aluminum-magnesium  hydroxide-simethicone (MAALOX) 200-200-20 MG/5ML SUSP Take 30 mLs by mouth 4 (four) times daily -  before meals and at bedtime. 06/15/17   Sharman CheekStafford, Dashiel Bergquist, MD  Aspirin-Caffeine 500-32.5 MG TABS Take 1 tablet by mouth 2 (two) times daily.    [provider]  cyclobenzaprine (FLEXERIL) 10 MG tablet Take 10 mg by mouth 3 (three) times daily. 07/29/16   [provider]  diazepam (VALIUM) 5 MG tablet Take 1 tablet (5 mg total) by mouth every 8 (eight) hours as needed for muscle spasms. Patient not taking: Reported on 08/31/2016 12/03/15   Emily FilbertWilliams, Jonathan E, MD  diclofenac (VOLTAREN) 75 MG EC tablet Take 75 mg by mouth 2 (two) times daily.    [provider]  docusate sodium (COLACE) 100 MG capsule Take 1 capsule (100 mg total) by mouth daily as needed for mild constipation. 09/02/16   Christeen DouglasBeasley, Bethany, MD  famotidine (PEPCID) 20 MG tablet Take 1 tablet (20 mg total) by mouth 2 (two) times daily. 06/15/17   Sharman CheekStafford, Yitzel Shasteen, MD  gabapentin (NEURONTIN) 400 MG capsule Take 1 capsule (400 mg total) by mouth 3 (three) times daily. 12/29/15   Deeann SaintMiller, Howard, MD  ibuprofen (ADVIL,MOTRIN) 800 MG tablet Take 800 mg by mouth every 8 (eight) hours as needed.    [provider]  metoCLOPramide (REGLAN) 10 MG tablet Take 1 tablet (10 mg total) by mouth every 6 (six) hours as needed. 06/15/17   Scotty CourtStafford,  Aneta Mins, MD  Multiple Vitamin (MULTIVITAMIN) tablet Take 1 tablet by mouth daily.    [provider]  oxyCODONE-acetaminophen (PERCOCET) 5-325 MG tablet Take 1-2 tablets by mouth every 6 (six) hours as needed for severe pain. 09/03/16   Christeen Douglas, MD     Allergies Tylenol [acetaminophen]; Ultram [tramadol hcl]; Celebrex [celecoxib]; and Other   Family History  Problem Relation Age of Onset  . Diabetes Mother   . Hypertension Mother     Social History Social History   Tobacco Use  . Smoking status: Current Every Day Smoker    Packs/day: 0.50    Years:  10.00    Pack years: 5.00    Types: Cigarettes  . Smokeless tobacco: Never Used  Substance Use Topics  . Alcohol use: Yes    Comment: occassional beer  . Drug use: No    Review of Systems  Constitutional:   No fever positive chills.  ENT:   No sore throat. No rhinorrhea. Cardiovascular:   No chest pain or syncope. Respiratory:   No dyspnea or cough. Gastrointestinal:  Positive generalized abdominal pain and vomiting. No diarrhea or constipation.  Musculoskeletal:   Negative for focal pain or swelling All other systems reviewed and are negative except as documented above in ROS and HPI.  ____________________________________________   PHYSICAL EXAM:  VITAL SIGNS: ED Triage Vitals [06/15/17 1724]  Enc Vitals Group     BP 126/78     Pulse Rate 83     Resp 16     Temp 98.3 F (36.8 C)     Temp Source Oral     SpO2 100 %     Weight 197 lb (89.4 kg)     Height 5\' 4"  (1.626 m)     Head Circumference      Peak Flow      Pain Score 10     Pain Loc      Pain Edu?      Excl. in GC?     Vital signs reviewed, nursing assessments reviewed.   Constitutional:   Alert and oriented. Well appearing and in no distress. Somewhat anxious appearing Eyes:   No scleral icterus.  EOMI. No nystagmus. No conjunctival pallor. PERRL. ENT   Head:   Normocephalic and atraumatic.   Nose:   No congestion/rhinnorhea.    Mouth/Throat:   MMM, no pharyngeal erythema. No peritonsillar mass.    Neck:   No meningismus. Full ROM. Hematological/Lymphatic/Immunilogical:   No cervical lymphadenopathy. Cardiovascular:   RRR. Symmetric bilateral radial and DP pulses.  No murmurs.  Respiratory:   Normal respiratory effort without tachypnea/retractions. Breath sounds are clear and equal bilaterally. No wheezes/rales/rhonchi. Gastrointestinal:   Soft with generalized tenderness. Non distended. There is no CVA tenderness.  No rebound, rigidity, or guarding. Genitourinary:    deferred Musculoskeletal:   Normal range of motion in all extremities. No joint effusions.  No lower extremity tenderness.  No edema. Neurologic:   Normal speech and language.  Motor grossly intact. No gross focal neurologic deficits are appreciated.  Skin:    Skin is warm, dry and intact. No rash noted.  No petechiae, purpura, or bullae.  ____________________________________________    LABS (pertinent positives/negatives) (all labs ordered are listed, but only abnormal results are displayed) Labs Reviewed  COMPREHENSIVE METABOLIC PANEL - Abnormal; Notable for the following components:      Result Value   AST 62 (*)    All other components within normal limits  LIPASE, BLOOD  CBC  HCG, QUANTITATIVE, PREGNANCY  URINALYSIS, COMPLETE (UACMP) WITH MICROSCOPIC   ____________________________________________   EKG    ____________________________________________    RADIOLOGY  Ct Abdomen Pelvis W Contrast  Result Date: 06/15/2017 CLINICAL DATA:  41 year old female presenting with worsening abdominal pain. History of back surgery, kidney stones, appendectomy and cholecystectomy. EXAM: CT ABDOMEN AND PELVIS WITH CONTRAST TECHNIQUE: Multidetector CT imaging of the abdomen and pelvis was performed using the standard protocol following bolus administration of intravenous contrast. CONTRAST:  100mL ISOVUE-300 IOPAMIDOL (ISOVUE-300) INJECTION 61% COMPARISON:  08/11/2016 and 10/22/2014 FINDINGS: Lower chest: The included heart is top-normal in size. No active pulmonary disease. Hepatobiliary: Status post cholecystectomy. Normal mild reservoir effect with slight intrahepatic ductal dilatation secondary to cholecystectomy. No enhancing mass lesions of the liver. Pancreas: Normal Spleen: Normal Adrenals/Urinary Tract: Normal bilateral adrenal glands and kidneys without obstructive uropathy. No enhancing mass lesions. The urinary bladder is physiologically distended. Stomach/Bowel: The stomach,  small intestine and colon are nonacute. No inflammation or obstruction. Status post appendectomy. Midline abdominal position of the cecum suggesting a mobile cecum. Vascular/Lymphatic: Mild reactive inguinal lymphadenopathy secondary to chronic subcutaneous inflammatory change within both inguinal creases and mons pubis dating back to 2016 CT. The largest lymph node measures approximately 12 mm short axis. No abscess is noted. Normal caliber to aorta. No pelvic or retroperitoneal lymphadenopathy. Reproductive: Left adnexal/ovarian cysts without worrisome features measuring 2.6 and 3.3 cm respectively. No right adnexal mass is apparent on current CT. The uterus is unremarkable. Other: No abdominal wall hernia or abnormality. No abdominopelvic ascites. Musculoskeletal: L4-5 posterior lumbar fusion with interbody block. No acute osseous appearing abnormality. IMPRESSION: 1. Re-demonstration of subcutaneous soft tissue induration along both inguinal creases and mons pubis dating back to 2016 with reactive inguinal lymphadenopathy in the right consistent with chronic cellulitis. No abscess or drainable fluid collections. 2. Left adnexal/ ovarian simple appearing cysts measuring 2.6 and 3.3 cm in maximum dimension. No worrisome features identified. 3. Status post cholecystectomy and appendectomy. 4. Small-bowel cecum with cecal position in the mid abdomen on current study, previously in the left upper quadrant. Electronically Signed   By: Tollie Ethavid  Kwon M.D.   On: 06/15/2017 19:50    ____________________________________________   PROCEDURES Procedures  ____________________________________________   DIFFERENTIAL DIAGNOSIS  Bowel obstruction, optic ulcer disease, bowel perforation. Choledocholithiasis  CLINICAL IMPRESSION / ASSESSMENT AND PLAN / ED COURSE  Pertinent labs & imaging results that were available during my care of the patient were reviewed by me and considered in my medical decision making (see  chart for details).     Clinical Course as of Jun 15 2246  Wed Jun 15, 2017  1907 Severe gen abd pain. Reviewed EMR. S/p appendectomy and ccy. Been taking lots of nsaids recently. Likely gerd/gastritis but possible perforated ulcer. Will obtain CT a/p to eval. Pain med, nausea med, hydration in themeantime.   [PS]    Clinical Course User Index [PS] Sharman CheekStafford, Deneen Slager, MD    ----------------------------------------- 10:47 PM on 06/15/2017 -----------------------------------------  Patient improved, vital signs remained stable and normal. CT negative for intra-abdominal pathology such as choledocholithiasis, pancreatitis, or perforated ulcer. Continue her on acid suppression regimen, follow up with primary care. Most likely gastritis related to NSAID use.   ____________________________________________   FINAL CLINICAL IMPRESSION(S) / ED DIAGNOSES    Final diagnoses:  Generalized abdominal pain  Acute gastritis without hemorrhage, unspecified gastritis type      This SmartLink is deprecated. Use AVSMEDLIST instead to display the medication list for a patient.  Portions of this note were generated with dragon dictation software. Dictation errors may occur despite best attempts at proofreading.    Sharman Cheek, MD 06/15/17 [phone]

## 2017-06-15 NOTE — ED Notes (Signed)
Pt presents today with abdominal pain that has worsened. Pt states pain is a 10/10. Pt denies gown at this time and is awaiting EDP.

## 2017-08-21 ENCOUNTER — Emergency Department (HOSPITAL_COMMUNITY)
Admission: EM | Admit: 2017-08-21 | Discharge: 2017-08-21 | Discharge status: Against Medical Advice | Payer: Medicare Other | Attending: Emergency Medicine | Admitting: Emergency Medicine

## 2017-08-21 ENCOUNTER — Other Ambulatory Visit: Payer: Self-pay

## 2017-08-21 ENCOUNTER — Encounter (HOSPITAL_COMMUNITY): Payer: Self-pay | Admitting: Emergency Medicine

## 2017-08-21 DIAGNOSIS — Z5321 Procedure and treatment not carried out due to patient leaving prior to being seen by health care provider: Secondary | ICD-10-CM | POA: Diagnosis not present

## 2017-08-21 DIAGNOSIS — M791 Myalgia, unspecified site: Secondary | ICD-10-CM | POA: Insufficient documentation

## 2017-08-21 NOTE — ED Triage Notes (Signed)
Pt reports "my whole LT side of my body hurts and is swollen." States this has been going on for two weeks. Denies injury. Pain worsens with movement. States the worst pain is beneath her LT scapula.

## 2017-08-21 NOTE — ED Notes (Signed)
Pt called for room with no response. Will attempt again with next available room.

## 2017-08-21 NOTE — ED Notes (Signed)
Called for room x2 with no response. 

## 2017-08-22 DIAGNOSIS — R1033 Periumbilical pain: Secondary | ICD-10-CM | POA: Diagnosis not present

## 2017-08-22 DIAGNOSIS — R42 Dizziness and giddiness: Secondary | ICD-10-CM | POA: Diagnosis not present

## 2017-08-22 DIAGNOSIS — R2232 Localized swelling, mass and lump, left upper limb: Secondary | ICD-10-CM | POA: Diagnosis not present

## 2017-08-22 DIAGNOSIS — F17200 Nicotine dependence, unspecified, uncomplicated: Secondary | ICD-10-CM | POA: Diagnosis not present

## 2017-08-22 DIAGNOSIS — R202 Paresthesia of skin: Secondary | ICD-10-CM | POA: Diagnosis not present

## 2017-08-22 DIAGNOSIS — R590 Localized enlarged lymph nodes: Secondary | ICD-10-CM | POA: Diagnosis not present

## 2017-08-26 ENCOUNTER — Ambulatory Visit: Payer: Medicare Other | Admitting: Family Medicine

## 2017-10-11 DIAGNOSIS — M25562 Pain in left knee: Secondary | ICD-10-CM | POA: Diagnosis not present

## 2017-11-05 DIAGNOSIS — S92511A Displaced fracture of proximal phalanx of right lesser toe(s), initial encounter for closed fracture: Secondary | ICD-10-CM | POA: Diagnosis not present

## 2017-11-05 DIAGNOSIS — M79671 Pain in right foot: Secondary | ICD-10-CM | POA: Diagnosis not present

## 2017-11-05 DIAGNOSIS — S92404A Nondisplaced unspecified fracture of right great toe, initial encounter for closed fracture: Secondary | ICD-10-CM | POA: Diagnosis not present

## 2017-11-05 DIAGNOSIS — S92414A Nondisplaced fracture of proximal phalanx of right great toe, initial encounter for closed fracture: Secondary | ICD-10-CM | POA: Diagnosis not present

## 2017-11-08 DIAGNOSIS — Z96652 Presence of left artificial knee joint: Secondary | ICD-10-CM | POA: Diagnosis not present

## 2017-11-08 DIAGNOSIS — M25562 Pain in left knee: Secondary | ICD-10-CM | POA: Diagnosis not present

## 2017-11-08 DIAGNOSIS — S92404A Nondisplaced unspecified fracture of right great toe, initial encounter for closed fracture: Secondary | ICD-10-CM | POA: Diagnosis not present

## 2017-11-08 DIAGNOSIS — M79671 Pain in right foot: Secondary | ICD-10-CM | POA: Diagnosis not present

## 2017-11-21 DIAGNOSIS — M545 Low back pain: Secondary | ICD-10-CM | POA: Diagnosis not present

## 2017-11-22 DIAGNOSIS — F422 Mixed obsessional thoughts and acts: Secondary | ICD-10-CM | POA: Insufficient documentation

## 2017-11-22 DIAGNOSIS — G43109 Migraine with aura, not intractable, without status migrainosus: Secondary | ICD-10-CM | POA: Insufficient documentation

## 2017-11-22 DIAGNOSIS — F4001 Agoraphobia with panic disorder: Secondary | ICD-10-CM | POA: Diagnosis not present

## 2017-11-23 ENCOUNTER — Other Ambulatory Visit: Payer: Self-pay | Admitting: Orthopedic Surgery

## 2017-11-23 DIAGNOSIS — M545 Low back pain: Secondary | ICD-10-CM

## 2017-12-05 ENCOUNTER — Ambulatory Visit
Admission: RE | Admit: 2017-12-05 | Discharge: 2017-12-05 | Disposition: A | Discharge status: Home / Self Care | Payer: Medicare Other | Source: Ambulatory Visit | Attending: Orthopedic Surgery | Admitting: Orthopedic Surgery

## 2017-12-05 DIAGNOSIS — M545 Low back pain: Secondary | ICD-10-CM | POA: Diagnosis not present

## 2017-12-05 DIAGNOSIS — M5126 Other intervertebral disc displacement, lumbar region: Secondary | ICD-10-CM | POA: Diagnosis not present

## 2017-12-19 ENCOUNTER — Other Ambulatory Visit: Payer: Medicare Other | Admitting: Obstetrics & Gynecology

## 2018-01-02 DIAGNOSIS — M545 Low back pain: Secondary | ICD-10-CM | POA: Diagnosis not present

## 2018-01-03 ENCOUNTER — Other Ambulatory Visit: Payer: Medicare Other | Admitting: Obstetrics & Gynecology

## 2018-01-04 DIAGNOSIS — Z124 Encounter for screening for malignant neoplasm of cervix: Secondary | ICD-10-CM | POA: Diagnosis not present

## 2018-01-04 DIAGNOSIS — F322 Major depressive disorder, single episode, severe without psychotic features: Secondary | ICD-10-CM | POA: Insufficient documentation

## 2018-01-04 DIAGNOSIS — R5383 Other fatigue: Secondary | ICD-10-CM | POA: Diagnosis not present

## 2018-01-04 DIAGNOSIS — R7301 Impaired fasting glucose: Secondary | ICD-10-CM | POA: Diagnosis not present

## 2018-01-04 DIAGNOSIS — L732 Hidradenitis suppurativa: Secondary | ICD-10-CM | POA: Insufficient documentation

## 2018-01-04 DIAGNOSIS — Z136 Encounter for screening for cardiovascular disorders: Secondary | ICD-10-CM | POA: Diagnosis not present

## 2018-01-04 DIAGNOSIS — Z779 Other contact with and (suspected) exposures hazardous to health: Secondary | ICD-10-CM | POA: Diagnosis not present

## 2018-01-04 DIAGNOSIS — F17210 Nicotine dependence, cigarettes, uncomplicated: Secondary | ICD-10-CM | POA: Insufficient documentation

## 2018-01-04 DIAGNOSIS — Z639 Problem related to primary support group, unspecified: Secondary | ICD-10-CM | POA: Diagnosis not present

## 2018-01-04 DIAGNOSIS — F4001 Agoraphobia with panic disorder: Secondary | ICD-10-CM | POA: Diagnosis not present

## 2018-01-04 DIAGNOSIS — Z13220 Encounter for screening for lipoid disorders: Secondary | ICD-10-CM | POA: Diagnosis not present

## 2018-01-04 DIAGNOSIS — Z1151 Encounter for screening for human papillomavirus (HPV): Secondary | ICD-10-CM | POA: Diagnosis not present

## 2018-01-11 DIAGNOSIS — M25562 Pain in left knee: Secondary | ICD-10-CM | POA: Diagnosis not present

## 2018-01-11 DIAGNOSIS — Z96652 Presence of left artificial knee joint: Secondary | ICD-10-CM | POA: Diagnosis not present

## 2018-01-12 DIAGNOSIS — R5383 Other fatigue: Secondary | ICD-10-CM | POA: Diagnosis not present

## 2018-01-12 DIAGNOSIS — Z136 Encounter for screening for cardiovascular disorders: Secondary | ICD-10-CM | POA: Diagnosis not present

## 2018-01-12 DIAGNOSIS — Z13220 Encounter for screening for lipoid disorders: Secondary | ICD-10-CM | POA: Diagnosis not present

## 2018-01-12 DIAGNOSIS — R7301 Impaired fasting glucose: Secondary | ICD-10-CM | POA: Diagnosis not present

## 2018-01-18 ENCOUNTER — Other Ambulatory Visit: Payer: Self-pay | Admitting: Orthopedic Surgery

## 2018-01-24 DIAGNOSIS — M47816 Spondylosis without myelopathy or radiculopathy, lumbar region: Secondary | ICD-10-CM | POA: Diagnosis not present

## 2018-01-30 NOTE — Pre-Procedure Instructions (Signed)
Henrietta Dineheresa A Cindric  01/30/2018      [id number] - [place], [place] - [address] AT [facility] [address] [place] [address] Phone: [phone] Fax: [phone]    Your procedure is scheduled on Friday August 9.  Report to [facility] Admitting at 8:00 A.M.  Call this number if you have problems the morning of surgery:  [phone]   Remember:  Do not eat or drink after midnight.    Take these medicines the morning of surgery with A SIP OF WATER:   Bupropion (wellbutrin) Flexeril if needed Doxycycline (Vibramycin)  7 days prior to surgery STOP taking any Aspirin(unless otherwise instructed by your surgeon), Aleve, Naproxen, Ibuprofen, Motrin, Advil, Goody's, BC's, all herbal medications, fish oil, and all vitamins     Do not wear jewelry, make-up or nail polish.  Do not wear lotions, powders, or perfumes, or deodorant.  Do not shave 48 hours prior to surgery.  Men may shave face and neck.  Do not bring valuables to the hospital.  [facility] is not responsible for any belongings or valuables.  Contacts, dentures or bridgework may not be worn into surgery.  Leave your suitcase in the car.  After surgery it may be brought to your room.  For patients admitted to the hospital, discharge time will be determined by your treatment team.  Patients discharged the day of surgery will not be allowed to drive home.   Special instructions:    [place]- Preparing For Surgery  Before surgery, you can play an important role. Because skin is not sterile, your skin needs to be as free of germs as possible. You can reduce the number of germs on your skin by washing with CHG (chlorahexidine gluconate) Soap before surgery.  CHG is an antiseptic cleaner which kills germs and bonds with the skin to continue killing germs even after washing.    Oral Hygiene is also important to reduce your risk of infection.  Remember - BRUSH YOUR TEETH THE MORNING OF  SURGERY WITH YOUR REGULAR TOOTHPASTE  Please do not use if you have an allergy to CHG or antibacterial soaps. If your skin becomes reddened/irritated stop using the CHG.  Do not shave (including legs and underarms) for at least 48 hours prior to first CHG shower. It is OK to shave your face.  Please follow these instructions carefully.   1. Shower the NIGHT BEFORE SURGERY and the MORNING OF SURGERY with CHG.   2. If you chose to wash your hair, wash your hair first as usual with your normal shampoo.  3. After you shampoo, rinse your hair and body thoroughly to remove the shampoo.  4. Use CHG as you would any other liquid soap. You can apply CHG directly to the skin and wash gently with a scrungie or a clean washcloth.   5. Apply the CHG Soap to your body ONLY FROM THE NECK DOWN.  Do not use on open wounds or open sores. Avoid contact with your eyes, ears, mouth and genitals (private parts). Wash Face and genitals (private parts)  with your normal soap.  6. Wash thoroughly, paying special attention to the area where your surgery will be performed.  7. Thoroughly rinse your body with warm water from the neck down.  8. DO NOT shower/wash with your normal soap after using and rinsing off the CHG Soap.  9. Pat yourself dry with a CLEAN TOWEL.  10. Wear CLEAN PAJAMAS to bed  the night before surgery, wear comfortable clothes the morning of surgery  11. Place CLEAN SHEETS on your bed the night of your first shower and DO NOT SLEEP WITH PETS.    Day of Surgery:  Do not apply any deodorants/lotions.  Please wear clean clothes to the hospital/surgery center.   Remember to brush your teeth WITH YOUR REGULAR TOOTHPASTE.    Please read over the following fact sheets that you were given. Coughing and Deep Breathing, MRSA Information and Surgical Site Infection Prevention

## 2018-01-31 ENCOUNTER — Other Ambulatory Visit: Payer: Self-pay

## 2018-01-31 ENCOUNTER — Encounter (HOSPITAL_COMMUNITY)
Admission: RE | Admit: 2018-01-31 | Discharge: 2018-01-31 | Disposition: A | Discharge status: Home / Self Care | Payer: Medicare Other | Source: Ambulatory Visit | Attending: Orthopedic Surgery | Admitting: Orthopedic Surgery

## 2018-01-31 ENCOUNTER — Encounter (HOSPITAL_COMMUNITY): Payer: Self-pay | Admitting: *Deleted

## 2018-01-31 DIAGNOSIS — Z01818 Encounter for other preprocedural examination: Secondary | ICD-10-CM

## 2018-01-31 DIAGNOSIS — Z01810 Encounter for preprocedural cardiovascular examination: Secondary | ICD-10-CM | POA: Diagnosis not present

## 2018-01-31 DIAGNOSIS — Z01812 Encounter for preprocedural laboratory examination: Secondary | ICD-10-CM | POA: Insufficient documentation

## 2018-01-31 HISTORY — DX: Personal history of urinary calculi: Z87.442

## 2018-01-31 LAB — CBC WITH DIFFERENTIAL/PLATELET
ABS IMMATURE GRANULOCYTES: 0 10*3/uL (ref 0.0–0.1)
BASOS ABS: 0.1 10*3/uL (ref 0.0–0.1)
BASOS PCT: 1 %
Eosinophils Absolute: 0.4 10*3/uL (ref 0.0–0.7)
Eosinophils Relative: 4 %
HCT: 41.5 % (ref 36.0–46.0)
HEMOGLOBIN: 13.3 g/dL (ref 12.0–15.0)
IMMATURE GRANULOCYTES: 0 %
LYMPHS PCT: 30 %
Lymphs Abs: 2.5 10*3/uL (ref 0.7–4.0)
MCH: 30.8 pg (ref 26.0–34.0)
MCHC: 32 g/dL (ref 30.0–36.0)
MCV: 96.1 fL (ref 78.0–100.0)
Monocytes Absolute: 0.6 10*3/uL (ref 0.1–1.0)
Monocytes Relative: 8 %
NEUTROS ABS: 4.8 10*3/uL (ref 1.7–7.7)
NEUTROS PCT: 57 %
PLATELETS: 197 10*3/uL (ref 150–400)
RBC: 4.32 MIL/uL (ref 3.87–5.11)
RDW: 12 % (ref 11.5–15.5)
WBC: 8.3 10*3/uL (ref 4.0–10.5)

## 2018-01-31 LAB — BASIC METABOLIC PANEL
ANION GAP: 11 (ref 5–15)
BUN: 7 mg/dL (ref 6–20)
CALCIUM: 9.4 mg/dL (ref 8.9–10.3)
CO2: 19 mmol/L — AB (ref 22–32)
Chloride: 106 mmol/L (ref 98–111)
Creatinine, Ser: 0.79 mg/dL (ref 0.44–1.00)
Glucose, Bld: 75 mg/dL (ref 70–99)
POTASSIUM: 4.7 mmol/L (ref 3.5–5.1)
Sodium: 136 mmol/L (ref 135–145)

## 2018-01-31 LAB — APTT: APTT: 29 s (ref 24–36)

## 2018-01-31 LAB — URINALYSIS, ROUTINE W REFLEX MICROSCOPIC
BILIRUBIN URINE: NEGATIVE
GLUCOSE, UA: NEGATIVE mg/dL
HGB URINE DIPSTICK: NEGATIVE
KETONES UR: NEGATIVE mg/dL
Leukocytes, UA: NEGATIVE
Nitrite: NEGATIVE
PH: 5 (ref 5.0–8.0)
Protein, ur: NEGATIVE mg/dL
SPECIFIC GRAVITY, URINE: 1.006 (ref 1.005–1.030)

## 2018-01-31 LAB — PROTIME-INR
INR: 1.11
PROTHROMBIN TIME: 14.2 s (ref 11.4–15.2)

## 2018-01-31 LAB — TYPE AND SCREEN
ABO/RH(D): A POS
Antibody Screen: NEGATIVE

## 2018-01-31 LAB — SURGICAL PCR SCREEN
MRSA, PCR: NEGATIVE
Staphylococcus aureus: NEGATIVE

## 2018-01-31 NOTE — Progress Notes (Signed)
PCP - Angela CoxAlethea Barrino Cardiologist - denies  Chest x-ray - 05/31/18 EKG - 01/31/18 Stress Test - denies ECHO - denies Cardiac Cath - denies   Anesthesia review: No  Patient denies shortness of breath, fever, cough and chest pain at PAT appointment   Patient verbalized understanding of instructions that were given to them at the PAT appointment. Patient was also instructed that they will need to review over the PAT instructions again at home before surgery.

## 2018-02-02 DIAGNOSIS — F17210 Nicotine dependence, cigarettes, uncomplicated: Secondary | ICD-10-CM | POA: Diagnosis not present

## 2018-02-02 DIAGNOSIS — G43109 Migraine with aura, not intractable, without status migrainosus: Secondary | ICD-10-CM | POA: Diagnosis not present

## 2018-02-02 DIAGNOSIS — F322 Major depressive disorder, single episode, severe without psychotic features: Secondary | ICD-10-CM | POA: Diagnosis not present

## 2018-02-07 DIAGNOSIS — Z96652 Presence of left artificial knee joint: Secondary | ICD-10-CM | POA: Diagnosis present

## 2018-02-07 NOTE — H&P (Signed)
TOTAL KNEE REVISION ADMISSION H&P  Patient is being admitted for left revision total knee arthroplasty.  Subjective:  Chief Complaint:left knee pain.  HPI: Melissa [name], 42 y.o. female, has a history of pain and functional disability in the left knee(s) due to failed previous arthroplasty and patient has failed non-surgical conservative treatments for greater than 12 weeks to include NSAID's and/or analgesics and activity modification. The indications for the revision of the total knee arthroplasty are painful left unicompartmental knee. Onset of symptoms was gradual starting 1 years ago with gradually worsening course since that time.  Prior procedures on the left knee(s) include unicompartmental arthroplasty.  Patient currently rates pain in the left knee(s) at 10 out of 10 with activity. There is night pain, worsening of pain with activity and weight bearing, pain that interferes with activities of daily living and pain with passive range of motion.  Patient has evidence of left unicompartmental knee by imaging studies. This condition presents safety issues increasing the risk of falls.    There is no current active infection.  Patient Active Problem List   Diagnosis Date Noted  . Acute pelvic pain, female 09/02/2016  . DDD (degenerative disc disease), lumbar 03/25/2015   Past Medical History:  Diagnosis Date  . Anemia   . Asthma    as a young adult - not for many years  . Depression   . Headache    migraines  . Heart murmur    2004 - told that when she had her son - no follow ups   . Hidradenitis 2007  . History of kidney stones   . Seizures (HCC)    as a baby     Past Surgical History:  Procedure Laterality Date  . APPENDECTOMY  1997  . AXILLARY HIDRADENITIS EXCISION Bilateral 2007  . BACK SURGERY    . CESAREAN SECTION    . CHOLECYSTECTOMY    . INGUINAL HIDRADENITIS EXCISION  06-18-14   left  . KIDNEY STONE SURGERY    . KNEE ARTHROSCOPY Left 12/29/2015   Procedure:  ARTHROSCOPY KNEE;  Surgeon: Deeann Saint, MD;  Location: [facility] ORS;  Service: Orthopedics;  Laterality: Left;  . KNEE SURGERY Left 1997  . LAPAROSCOPY N/A 09/02/2016   Procedure: LAPAROSCOPY OPERATIVE;  Surgeon: Christeen Douglas, MD;  Location: [facility] ORS;  Service: Gynecology;  Laterality: N/A;  . POSTERIOR LUMBAR FUSION N/A 03/25/2015   Procedure: POSTERIOR LUMBAR INTERBODY FUSION LUMBAR FOUR-FIVE;  Surgeon: Julio Sicks, MD;  Location: [facility] OR;  Service: Neurosurgery;  Laterality: N/A;  . TUBAL LIGATION      No current facility-administered medications for this encounter.    Current Outpatient Medications  Medication Sig Dispense Refill Last Dose  . buPROPion (WELLBUTRIN SR) 150 MG 12 hr tablet Take 150 mg by mouth 2 (two) times daily.     . cyclobenzaprine (FLEXERIL) 5 MG tablet Take 5-10 mg by mouth at bedtime as needed for muscle spasms.     Marland Kitchen doxycycline (VIBRAMYCIN) 100 MG capsule Take 100 mg by mouth 2 (two) times daily.     . naproxen sodium (ALEVE) 220 MG tablet Take 440 mg by mouth daily as needed (pain).     . famotidine (PEPCID) 20 MG tablet Take 1 tablet (20 mg total) by mouth 2 (two) times daily. (Patient not taking: Reported on 01/25/2018) 60 tablet 0 Not Taking at Unknown time   Allergies  Allergen Reactions  . Tylenol [Acetaminophen] Itching    Throat itch and high fever (no reaction to  hydrocodone with acetaminophen)  . Ultram [Tramadol Hcl] Nausea And Vomiting    Itching burning  (Can take Oxycodone)  . Celebrex [Celecoxib] Nausea And Vomiting  . Meloxicam Nausea And Vomiting  . Other Rash    Powder in gloves causes rash    Social History   Tobacco Use  . Smoking status: Current Some Day Smoker    Packs/day: 0.10    Years: 10.00    Pack years: 1.00    Types: Cigarettes  . Smokeless tobacco: Never Used  . Tobacco comment: smokes 1 in the morning occasionally  Substance Use Topics  . Alcohol use: Yes    Comment: occassional beer    Family History  Problem Relation  Age of Onset  . Diabetes Mother   . Hypertension Mother       Review of Systems  Constitutional: Positive for chills and fever.  HENT: Positive for hearing loss and tinnitus.   Eyes: Positive for blurred vision.  Respiratory: Positive for shortness of breath.   Cardiovascular: Positive for chest pain and leg swelling.  Gastrointestinal: Positive for abdominal pain and nausea.  Genitourinary:       Kidney stones  Musculoskeletal: Positive for joint pain and myalgias.  Skin: Negative.   Neurological: Positive for headaches.  Endo/Heme/Allergies: Negative.   Psychiatric/Behavioral: Positive for depression. The patient is nervous/anxious.      Objective:  Physical Exam  Constitutional: She is oriented to person, place, and time. She appears well-developed and well-nourished.  HENT:  Head: Normocephalic and atraumatic.  Eyes: Pupils are equal, round, and reactive to light.  Neck: Normal range of motion. Neck supple.  Cardiovascular: Intact distal pulses.  Respiratory: Effort normal.  Musculoskeletal: She exhibits tenderness.  The unicompartmental left total knee continues to have a full range of motion, no effusion, collateral ligaments are stable.  Diffusely tender to palpation anterolaterally and anteromedially.  Lockman's test is negative.    Neurological: She is alert and oriented to person, place, and time.  Skin: Skin is warm and dry.  Psychiatric: She has a normal mood and affect. Her behavior is normal. Judgment and thought content normal.    Vital signs in last 24 hours:    Labs:  Estimated body mass index is 35.02 kg/m as calculated from the following:   Height as of 01/31/18: 5\' 4"  (1.626 m).   Weight as of 01/31/18: 92.5 kg (204 lb).  Imaging Review Plain radiographs demonstrate  well-placed well fixed lateral unicompartmental left total knee, no evidence of loosening.     Preoperative templating of the joint replacement has been completed, documented, and  submitted to the Operating Room personnel in order to optimize intra-operative equipment management.   Assessment/Plan:  End stage arthritis, left knee(s) with failed previous arthroplasty.   The patient history, physical examination, clinical judgment of the provider and imaging studies are consistent with end stage degenerative joint disease of the left knee(s), previous total knee arthroplasty. Revision total knee arthroplasty is deemed medically necessary. The treatment options including medical management, injection therapy, arthroscopy and revision arthroplasty were discussed at length. The risks and benefits of revision total knee arthroplasty were presented and reviewed. The risks due to aseptic loosening, infection, stiffness, patella tracking problems, thromboembolic complications and other imponderables were discussed. The patient acknowledged the explanation, agreed to proceed with the plan and consent was signed. Patient is being admitted for inpatient treatment for surgery, pain control, PT, OT, prophylactic antibiotics, VTE prophylaxis, progressive ambulation and ADL's and discharge planning.The patient is  planning to be discharged home with home health services.

## 2018-02-09 MED ORDER — BUPIVACAINE LIPOSOME 1.3 % IJ SUSP
20.0000 mL | Freq: Once | INTRAMUSCULAR | Status: AC
  Administered 2018-02-10: 20 mL
  Filled 2018-02-09: qty 20

## 2018-02-09 MED ORDER — TRANEXAMIC ACID 1000 MG/10ML IV SOLN
1000.0000 mg | INTRAVENOUS | Status: AC
  Administered 2018-02-10: 1000 mg via INTRAVENOUS
  Filled 2018-02-09: qty 1100

## 2018-02-09 MED ORDER — TRANEXAMIC ACID 1000 MG/10ML IV SOLN
2000.0000 mg | INTRAVENOUS | Status: AC
  Administered 2018-02-10: 2000 mg via TOPICAL
  Filled 2018-02-09: qty 20

## 2018-02-10 ENCOUNTER — Other Ambulatory Visit: Payer: Self-pay

## 2018-02-10 ENCOUNTER — Inpatient Hospital Stay (HOSPITAL_COMMUNITY): Payer: Medicare Other | Admitting: Anesthesiology

## 2018-02-10 ENCOUNTER — Inpatient Hospital Stay (HOSPITAL_COMMUNITY)
Admission: RE | Admit: 2018-02-10 | Discharge: 2018-02-13 | DRG: 464 | Disposition: A | Discharge status: Home Health | Payer: Medicare Other | Source: Ambulatory Visit | Attending: Orthopedic Surgery | Admitting: Orthopedic Surgery

## 2018-02-10 ENCOUNTER — Encounter (HOSPITAL_COMMUNITY): Payer: Self-pay | Admitting: *Deleted

## 2018-02-10 ENCOUNTER — Encounter (HOSPITAL_COMMUNITY)
Admission: RE | Disposition: A | Discharge status: Home Health | Payer: Self-pay | Source: Ambulatory Visit | Attending: Orthopedic Surgery

## 2018-02-10 DIAGNOSIS — Y838 Other surgical procedures as the cause of abnormal reaction of the patient, or of later complication, without mention of misadventure at the time of the procedure: Secondary | ICD-10-CM | POA: Diagnosis present

## 2018-02-10 DIAGNOSIS — T84023A Instability of internal left knee prosthesis, initial encounter: Secondary | ICD-10-CM | POA: Diagnosis not present

## 2018-02-10 DIAGNOSIS — Z9109 Other allergy status, other than to drugs and biological substances: Secondary | ICD-10-CM | POA: Diagnosis not present

## 2018-02-10 DIAGNOSIS — Z87442 Personal history of urinary calculi: Secondary | ICD-10-CM

## 2018-02-10 DIAGNOSIS — F419 Anxiety disorder, unspecified: Secondary | ICD-10-CM | POA: Diagnosis present

## 2018-02-10 DIAGNOSIS — M1712 Unilateral primary osteoarthritis, left knee: Secondary | ICD-10-CM | POA: Diagnosis present

## 2018-02-10 DIAGNOSIS — M25562 Pain in left knee: Secondary | ICD-10-CM | POA: Diagnosis not present

## 2018-02-10 DIAGNOSIS — J45909 Unspecified asthma, uncomplicated: Secondary | ICD-10-CM | POA: Diagnosis not present

## 2018-02-10 DIAGNOSIS — F329 Major depressive disorder, single episode, unspecified: Secondary | ICD-10-CM | POA: Diagnosis present

## 2018-02-10 DIAGNOSIS — E669 Obesity, unspecified: Secondary | ICD-10-CM | POA: Diagnosis present

## 2018-02-10 DIAGNOSIS — Z79899 Other long term (current) drug therapy: Secondary | ICD-10-CM

## 2018-02-10 DIAGNOSIS — F17200 Nicotine dependence, unspecified, uncomplicated: Secondary | ICD-10-CM | POA: Diagnosis present

## 2018-02-10 DIAGNOSIS — Z888 Allergy status to other drugs, medicaments and biological substances status: Secondary | ICD-10-CM

## 2018-02-10 DIAGNOSIS — D62 Acute posthemorrhagic anemia: Secondary | ICD-10-CM | POA: Diagnosis not present

## 2018-02-10 DIAGNOSIS — Z6835 Body mass index (BMI) 35.0-35.9, adult: Secondary | ICD-10-CM

## 2018-02-10 DIAGNOSIS — M5136 Other intervertebral disc degeneration, lumbar region: Secondary | ICD-10-CM | POA: Diagnosis present

## 2018-02-10 DIAGNOSIS — Z96652 Presence of left artificial knee joint: Secondary | ICD-10-CM | POA: Diagnosis not present

## 2018-02-10 DIAGNOSIS — D649 Anemia, unspecified: Secondary | ICD-10-CM | POA: Diagnosis not present

## 2018-02-10 DIAGNOSIS — T8484XA Pain due to internal orthopedic prosthetic devices, implants and grafts, initial encounter: Secondary | ICD-10-CM | POA: Diagnosis not present

## 2018-02-10 DIAGNOSIS — G8918 Other acute postprocedural pain: Secondary | ICD-10-CM | POA: Diagnosis not present

## 2018-02-10 HISTORY — PX: TOTAL KNEE REVISION: SHX996

## 2018-02-10 LAB — POCT PREGNANCY, URINE: PREG TEST UR: NEGATIVE

## 2018-02-10 SURGERY — TOTAL KNEE REVISION
Anesthesia: General | Laterality: Left

## 2018-02-10 MED ORDER — ROPIVACAINE HCL 7.5 MG/ML IJ SOLN
INTRAMUSCULAR | Status: DC | PRN
  Administered 2018-02-10: 25 mL via PERINEURAL

## 2018-02-10 MED ORDER — APIXABAN 2.5 MG PO TABS
2.5000 mg | ORAL_TABLET | Freq: Two times a day (BID) | ORAL | Status: DC
  Administered 2018-02-11 – 2018-02-13 (×5): 2.5 mg via ORAL
  Filled 2018-02-10 (×5): qty 1

## 2018-02-10 MED ORDER — HYDROMORPHONE HCL 1 MG/ML IJ SOLN
0.5000 mg | INTRAMUSCULAR | Status: DC | PRN
  Administered 2018-02-10 – 2018-02-11 (×6): 1 mg via INTRAVENOUS
  Filled 2018-02-10 (×6): qty 1

## 2018-02-10 MED ORDER — FENTANYL CITRATE (PF) 100 MCG/2ML IJ SOLN
INTRAMUSCULAR | Status: AC
  Filled 2018-02-10: qty 2

## 2018-02-10 MED ORDER — PHENOL 1.4 % MT LIQD: 1.0000 | OROMUCOSAL | Status: DC | PRN

## 2018-02-10 MED ORDER — DIPHENHYDRAMINE HCL 12.5 MG/5ML PO ELIX: 12.5000 mg | ORAL_SOLUTION | ORAL | Status: DC | PRN

## 2018-02-10 MED ORDER — GABAPENTIN 300 MG PO CAPS
300.0000 mg | ORAL_CAPSULE | Freq: Three times a day (TID) | ORAL | Status: DC
  Administered 2018-02-10 – 2018-02-13 (×9): 300 mg via ORAL
  Filled 2018-02-10 (×9): qty 1

## 2018-02-10 MED ORDER — BISACODYL 5 MG PO TBEC: 5.0000 mg | DELAYED_RELEASE_TABLET | Freq: Every day | ORAL | Status: DC | PRN

## 2018-02-10 MED ORDER — OXYCODONE HCL 5 MG/5ML PO SOLN: 5.0000 mg | Freq: Once | ORAL | Status: DC | PRN

## 2018-02-10 MED ORDER — CHLORHEXIDINE GLUCONATE 4 % EX LIQD: 60.0000 mL | Freq: Once | CUTANEOUS | Status: DC

## 2018-02-10 MED ORDER — TIZANIDINE HCL 2 MG PO TABS: 2.0000 mg | ORAL_TABLET | Freq: Four times a day (QID) | ORAL | 0 refills | Status: DC | PRN

## 2018-02-10 MED ORDER — OXYCODONE HCL 5 MG PO TABS: 5.0000 mg | ORAL_TABLET | Freq: Once | ORAL | Status: DC | PRN

## 2018-02-10 MED ORDER — MIDAZOLAM HCL 2 MG/2ML IJ SOLN
2.0000 mg | Freq: Once | INTRAMUSCULAR | Status: AC
  Administered 2018-02-10: 2 mg via INTRAVENOUS

## 2018-02-10 MED ORDER — MIDAZOLAM HCL 2 MG/2ML IJ SOLN
INTRAMUSCULAR | Status: AC
  Administered 2018-02-10: 2 mg via INTRAVENOUS
  Filled 2018-02-10: qty 2

## 2018-02-10 MED ORDER — ACETAMINOPHEN 325 MG PO TABS: 325.0000 mg | ORAL_TABLET | ORAL | Status: DC | PRN

## 2018-02-10 MED ORDER — FLEET ENEMA 7-19 GM/118ML RE ENEM: 1.0000 | ENEMA | Freq: Once | RECTAL | Status: DC | PRN

## 2018-02-10 MED ORDER — TRANEXAMIC ACID 1000 MG/10ML IV SOLN
1000.0000 mg | Freq: Once | INTRAVENOUS | Status: AC
  Administered 2018-02-10: 1000 mg via INTRAVENOUS
  Filled 2018-02-10: qty 10

## 2018-02-10 MED ORDER — HYDROMORPHONE HCL 2 MG PO TABS
2.0000 mg | ORAL_TABLET | ORAL | Status: DC | PRN
  Administered 2018-02-10 – 2018-02-13 (×11): 2 mg via ORAL
  Filled 2018-02-10 (×12): qty 1

## 2018-02-10 MED ORDER — ACETAMINOPHEN 160 MG/5ML PO SOLN: 325.0000 mg | ORAL | Status: DC | PRN

## 2018-02-10 MED ORDER — BUPROPION HCL ER (SR) 150 MG PO TB12
150.0000 mg | ORAL_TABLET | Freq: Two times a day (BID) | ORAL | Status: DC
  Administered 2018-02-10 – 2018-02-13 (×7): 150 mg via ORAL
  Filled 2018-02-10 (×7): qty 1

## 2018-02-10 MED ORDER — PROPOFOL 10 MG/ML IV BOLUS
INTRAVENOUS | Status: DC | PRN
  Administered 2018-02-10: 20 mg via INTRAVENOUS
  Administered 2018-02-10: 40 mg via INTRAVENOUS

## 2018-02-10 MED ORDER — BUPIVACAINE IN DEXTROSE 0.75-8.25 % IT SOLN
INTRATHECAL | Status: DC | PRN
  Administered 2018-02-10: 1.8 mL via INTRATHECAL

## 2018-02-10 MED ORDER — APIXABAN 2.5 MG PO TABS: 2.5000 mg | ORAL_TABLET | Freq: Two times a day (BID) | ORAL | 0 refills | Status: DC

## 2018-02-10 MED ORDER — ONDANSETRON HCL 4 MG/2ML IJ SOLN: 4.0000 mg | Freq: Once | INTRAMUSCULAR | Status: DC | PRN

## 2018-02-10 MED ORDER — ONDANSETRON HCL 4 MG/2ML IJ SOLN
INTRAMUSCULAR | Status: AC
  Filled 2018-02-10: qty 2

## 2018-02-10 MED ORDER — ACETAMINOPHEN 325 MG PO TABS: 325.0000 mg | ORAL_TABLET | Freq: Four times a day (QID) | ORAL | Status: DC | PRN

## 2018-02-10 MED ORDER — ONDANSETRON HCL 4 MG/2ML IJ SOLN
4.0000 mg | Freq: Four times a day (QID) | INTRAMUSCULAR | Status: DC | PRN
  Administered 2018-02-10: 4 mg via INTRAVENOUS
  Filled 2018-02-10 (×2): qty 2

## 2018-02-10 MED ORDER — MENTHOL 3 MG MT LOZG: 1.0000 | LOZENGE | OROMUCOSAL | Status: DC | PRN

## 2018-02-10 MED ORDER — FENTANYL CITRATE (PF) 100 MCG/2ML IJ SOLN
INTRAMUSCULAR | Status: AC
  Administered 2018-02-10: 100 ug via INTRAVENOUS
  Filled 2018-02-10: qty 2

## 2018-02-10 MED ORDER — KCL IN DEXTROSE-NACL 20-5-0.45 MEQ/L-%-% IV SOLN
INTRAVENOUS | Status: DC
  Administered 2018-02-10: 15:00:00 via INTRAVENOUS
  Filled 2018-02-10 (×3): qty 1000

## 2018-02-10 MED ORDER — DOCUSATE SODIUM 100 MG PO CAPS
100.0000 mg | ORAL_CAPSULE | Freq: Two times a day (BID) | ORAL | Status: DC
  Administered 2018-02-10 – 2018-02-13 (×7): 100 mg via ORAL
  Filled 2018-02-10 (×7): qty 1

## 2018-02-10 MED ORDER — PANTOPRAZOLE SODIUM 40 MG PO TBEC
40.0000 mg | DELAYED_RELEASE_TABLET | Freq: Every day | ORAL | Status: DC
  Administered 2018-02-10 – 2018-02-13 (×4): 40 mg via ORAL
  Filled 2018-02-10 (×4): qty 1

## 2018-02-10 MED ORDER — METOCLOPRAMIDE HCL 5 MG/ML IJ SOLN
5.0000 mg | Freq: Three times a day (TID) | INTRAMUSCULAR | Status: DC | PRN
Start: 2018-02-10 — End: 2018-02-13
  Administered 2018-02-10: 10 mg via INTRAVENOUS
  Filled 2018-02-10: qty 2

## 2018-02-10 MED ORDER — METHOCARBAMOL 1000 MG/10ML IJ SOLN
500.0000 mg | Freq: Four times a day (QID) | INTRAMUSCULAR | Status: DC | PRN
  Filled 2018-02-10: qty 5

## 2018-02-10 MED ORDER — FENTANYL CITRATE (PF) 100 MCG/2ML IJ SOLN
25.0000 ug | INTRAMUSCULAR | Status: DC | PRN
  Administered 2018-02-10: 50 ug via INTRAVENOUS

## 2018-02-10 MED ORDER — SODIUM CHLORIDE 0.9 % IJ SOLN
INTRAMUSCULAR | Status: DC | PRN
  Administered 2018-02-10: 50 mL

## 2018-02-10 MED ORDER — FENTANYL CITRATE (PF) 100 MCG/2ML IJ SOLN
100.0000 ug | Freq: Once | INTRAMUSCULAR | Status: AC
  Administered 2018-02-10: 100 ug via INTRAVENOUS

## 2018-02-10 MED ORDER — MIDAZOLAM HCL 2 MG/2ML IJ SOLN
INTRAMUSCULAR | Status: AC
  Filled 2018-02-10: qty 2

## 2018-02-10 MED ORDER — BUPIVACAINE-EPINEPHRINE (PF) 0.25% -1:200000 IJ SOLN
INTRAMUSCULAR | Status: AC
  Filled 2018-02-10: qty 60

## 2018-02-10 MED ORDER — SODIUM CHLORIDE 0.9 % IV SOLN
INTRAVENOUS | Status: DC | PRN
  Administered 2018-02-10: 40 ug/min via INTRAVENOUS

## 2018-02-10 MED ORDER — CEFAZOLIN SODIUM-DEXTROSE 2-4 GM/100ML-% IV SOLN
INTRAVENOUS | Status: AC
  Filled 2018-02-10: qty 100

## 2018-02-10 MED ORDER — PROPOFOL 500 MG/50ML IV EMUL
INTRAVENOUS | Status: DC | PRN
  Administered 2018-02-10: 75 ug/kg/min via INTRAVENOUS

## 2018-02-10 MED ORDER — MEPERIDINE HCL 50 MG/ML IJ SOLN: 6.2500 mg | INTRAMUSCULAR | Status: DC | PRN

## 2018-02-10 MED ORDER — ONDANSETRON HCL 4 MG PO TABS: 4.0000 mg | ORAL_TABLET | Freq: Four times a day (QID) | ORAL | Status: DC | PRN

## 2018-02-10 MED ORDER — HYDROMORPHONE HCL 2 MG PO TABS: 2.0000 mg | ORAL_TABLET | ORAL | 0 refills | Status: DC | PRN

## 2018-02-10 MED ORDER — SODIUM CHLORIDE 0.9 % IR SOLN
Status: DC | PRN
  Administered 2018-02-10: 3000 mL

## 2018-02-10 MED ORDER — CEFAZOLIN SODIUM-DEXTROSE 2-4 GM/100ML-% IV SOLN
2.0000 g | INTRAVENOUS | Status: AC
  Administered 2018-02-10: 2 g via INTRAVENOUS

## 2018-02-10 MED ORDER — BUPIVACAINE-EPINEPHRINE 0.5% -1:200000 IJ SOLN
INTRAMUSCULAR | Status: AC
  Filled 2018-02-10: qty 1

## 2018-02-10 MED ORDER — MIDAZOLAM HCL 5 MG/5ML IJ SOLN
INTRAMUSCULAR | Status: DC | PRN
  Administered 2018-02-10: 2 mg via INTRAVENOUS

## 2018-02-10 MED ORDER — 0.9 % SODIUM CHLORIDE (POUR BTL) OPTIME
TOPICAL | Status: DC | PRN
  Administered 2018-02-10: 1000 mL

## 2018-02-10 MED ORDER — DEXAMETHASONE SODIUM PHOSPHATE 10 MG/ML IJ SOLN
10.0000 mg | Freq: Once | INTRAMUSCULAR | Status: AC
  Administered 2018-02-11: 10 mg via INTRAVENOUS
  Filled 2018-02-10: qty 1

## 2018-02-10 MED ORDER — POLYETHYLENE GLYCOL 3350 17 G PO PACK: 17.0000 g | PACK | Freq: Every day | ORAL | Status: DC | PRN

## 2018-02-10 MED ORDER — ALUM & MAG HYDROXIDE-SIMETH 200-200-20 MG/5ML PO SUSP: 30.0000 mL | ORAL | Status: DC | PRN

## 2018-02-10 MED ORDER — TOPIRAMATE 25 MG PO TABS
25.0000 mg | ORAL_TABLET | Freq: Every evening | ORAL | Status: DC | PRN
  Administered 2018-02-12: 25 mg via ORAL
  Filled 2018-02-10: qty 1

## 2018-02-10 MED ORDER — METHOCARBAMOL 500 MG PO TABS
500.0000 mg | ORAL_TABLET | Freq: Four times a day (QID) | ORAL | Status: DC | PRN
  Administered 2018-02-11 (×2): 500 mg via ORAL
  Filled 2018-02-10 (×2): qty 1

## 2018-02-10 MED ORDER — LACTATED RINGERS IV SOLN
INTRAVENOUS | Status: DC
  Administered 2018-02-10 (×2): via INTRAVENOUS

## 2018-02-10 MED ORDER — METOCLOPRAMIDE HCL 5 MG PO TABS: 5.0000 mg | ORAL_TABLET | Freq: Three times a day (TID) | ORAL | Status: DC | PRN

## 2018-02-10 SURGICAL SUPPLY — 76 items
ATTUNE PS FEM LT SZ 5 CEM KNEE (Femur) ×2 IMPLANT
ATTUNE PSRP INSR SZ5 5 KNEE (Insert) ×2 IMPLANT
BAG DECANTER FOR FLEXI CONT (MISCELLANEOUS) ×2 IMPLANT
BANDAGE ACE 4X5 VEL STRL LF (GAUZE/BANDAGES/DRESSINGS) ×2 IMPLANT
BANDAGE ACE 6X5 VEL STRL LF (GAUZE/BANDAGES/DRESSINGS) ×2 IMPLANT
BANDAGE ESMARK 6X9 LF (GAUZE/BANDAGES/DRESSINGS) ×1 IMPLANT
BASE TIBIAL ROT PLAT SZ 5 KNEE (Knees) ×1 IMPLANT
BLADE CLIPPER SURG (BLADE) IMPLANT
BLADE SAG 18X100X1.27 (BLADE) ×2 IMPLANT
BLADE SAW SGTL 13X75X1.27 (BLADE) ×2 IMPLANT
BNDG ESMARK 6X9 LF (GAUZE/BANDAGES/DRESSINGS) ×2
BOWL SMART MIX CTS (DISPOSABLE) ×2 IMPLANT
CEMENT HV SMART SET (Cement) ×4 IMPLANT
COVER SURGICAL LIGHT HANDLE (MISCELLANEOUS) ×2 IMPLANT
CUFF TOURNIQUET SINGLE 34IN LL (TOURNIQUET CUFF) ×2 IMPLANT
DECANTER SPIKE VIAL GLASS SM (MISCELLANEOUS) ×2 IMPLANT
DRAPE EXTREMITY T 121X128X90 (DRAPE) ×2 IMPLANT
DRAPE HALF SHEET 40X57 (DRAPES) ×2 IMPLANT
DRAPE IMP U-DRAPE 54X76 (DRAPES) ×2 IMPLANT
DRAPE U-SHAPE 47X51 STRL (DRAPES) ×2 IMPLANT
DRSG AQUACEL AG ADV 3.5X10 (GAUZE/BANDAGES/DRESSINGS) ×2 IMPLANT
DURAPREP 26ML APPLICATOR (WOUND CARE) ×2 IMPLANT
ELECT REM PT RETURN 9FT ADLT (ELECTROSURGICAL) ×2
ELECTRODE REM PT RTRN 9FT ADLT (ELECTROSURGICAL) ×1 IMPLANT
GAUZE SPONGE 4X4 12PLY STRL (GAUZE/BANDAGES/DRESSINGS) ×4 IMPLANT
GAUZE XEROFORM 1X8 LF (GAUZE/BANDAGES/DRESSINGS) ×2 IMPLANT
GLOVE BIO SURGEON STRL SZ7.5 (GLOVE) ×2 IMPLANT
GLOVE BIOGEL PI IND STRL 8 (GLOVE) ×2 IMPLANT
GLOVE BIOGEL PI IND STRL 9 (GLOVE) ×1 IMPLANT
GLOVE BIOGEL PI INDICATOR 8 (GLOVE) ×2
GLOVE BIOGEL PI INDICATOR 9 (GLOVE) ×1
GLOVE SURG SS PI 6.5 STRL IVOR (GLOVE) ×2 IMPLANT
GLOVE SURG SS PI 7.5 STRL IVOR (GLOVE) ×2 IMPLANT
GLOVE SURG SS PI 8.5 STRL IVOR (GLOVE) ×1
GLOVE SURG SS PI 8.5 STRL STRW (GLOVE) ×1 IMPLANT
GOWN STRL REUS W/ TWL LRG LVL3 (GOWN DISPOSABLE) ×1 IMPLANT
GOWN STRL REUS W/ TWL XL LVL3 (GOWN DISPOSABLE) ×3 IMPLANT
GOWN STRL REUS W/TWL LRG LVL3 (GOWN DISPOSABLE) ×1
GOWN STRL REUS W/TWL XL LVL3 (GOWN DISPOSABLE) ×3
HANDPIECE INTERPULSE COAX TIP (DISPOSABLE) ×1
HOOD PEEL AWAY FACE SHEILD DIS (HOOD) ×4 IMPLANT
KIT BASIN OR (CUSTOM PROCEDURE TRAY) ×2 IMPLANT
KIT TURNOVER KIT B (KITS) ×2 IMPLANT
MANIFOLD NEPTUNE II (INSTRUMENTS) ×2 IMPLANT
NEEDLE HYPO 22GX1.5 SAFETY (NEEDLE) ×4 IMPLANT
NEEDLE SPNL 18GX3.5 QUINCKE PK (NEEDLE) IMPLANT
NS IRRIG 1000ML POUR BTL (IV SOLUTION) ×2 IMPLANT
PACK TOTAL JOINT (CUSTOM PROCEDURE TRAY) ×2 IMPLANT
PACK UNIVERSAL I (CUSTOM PROCEDURE TRAY) ×2 IMPLANT
PAD ARMBOARD 7.5X6 YLW CONV (MISCELLANEOUS) ×4 IMPLANT
PAD CAST 4YDX4 CTTN HI CHSV (CAST SUPPLIES) ×1 IMPLANT
PADDING CAST COTTON 4X4 STRL (CAST SUPPLIES) ×1
PADDING CAST COTTON 6X4 STRL (CAST SUPPLIES) ×2 IMPLANT
PATELLA MEDIAL ATTUN 35MM KNEE (Knees) ×2 IMPLANT
PIN FIX SIGMA HP QUICK REL (PIN) ×2 IMPLANT
PIN THREADED HEADED SIGMA (Pin) ×2 IMPLANT
SET HNDPC FAN SPRY TIP SCT (DISPOSABLE) ×1 IMPLANT
STAPLER VISISTAT 35W (STAPLE) IMPLANT
SUCTION FRAZIER HANDLE 10FR (MISCELLANEOUS) ×1
SUCTION TUBE FRAZIER 10FR DISP (MISCELLANEOUS) ×1 IMPLANT
SUT VIC AB 1 CT1 27 (SUTURE) ×1
SUT VIC AB 1 CT1 27XBRD ANBCTR (SUTURE) ×1 IMPLANT
SUT VIC AB 1 CTX 36 (SUTURE) ×1
SUT VIC AB 1 CTX36XBRD ANBCTR (SUTURE) ×1 IMPLANT
SUT VIC AB 2-0 CT1 27 (SUTURE) ×1
SUT VIC AB 2-0 CT1 TAPERPNT 27 (SUTURE) ×1 IMPLANT
SUT VIC AB 3-0 CT1 27 (SUTURE) ×1
SUT VIC AB 3-0 CT1 27XBRD (SUTURE) IMPLANT
SUT VIC AB 3-0 CT1 TAPERPNT 27 (SUTURE) ×1 IMPLANT
SWAB CULTURE ESWAB REG 1ML (MISCELLANEOUS) IMPLANT
SYR 50ML LL SCALE MARK (SYRINGE) IMPLANT
SYR CONTROL 10ML LL (SYRINGE) ×4 IMPLANT
TIBIAL BASE ROT PLAT SZ 5 KNEE (Knees) ×2 IMPLANT
TOWEL OR 17X24 6PK STRL BLUE (TOWEL DISPOSABLE) ×2 IMPLANT
TOWEL OR 17X26 10 PK STRL BLUE (TOWEL DISPOSABLE) ×2 IMPLANT
TRAY CATH 16FR W/PLASTIC CATH (SET/KITS/TRAYS/PACK) ×2 IMPLANT

## 2018-02-10 NOTE — Anesthesia Procedure Notes (Addendum)
Anesthesia Regional Block: Adductor canal block   Pre-Anesthetic Checklist: ,, timeout performed, Correct Patient, Correct Site, Correct Laterality, Correct Procedure, Correct Position, site marked, Risks and benefits discussed,  Surgical consent,  Pre-op evaluation,  At surgeon's request and post-op pain management  Laterality: Left  Prep: chloraprep       Needles:  Injection technique: Single-shot  Needle Type: Echogenic Stimulator Needle     Needle Length: 5cm  Needle Gauge: 22     Additional Needles:   Procedures:, nerve stimulator,,, ultrasound used (permanent image in chart),,,,   Nerve Stimulator or Paresthesia:  Response: 0.45 mA,   Additional Responses:   Narrative:  Start time: 02/10/2018 8:59 AM End time: 02/10/2018 9:05 AM Injection made incrementally with aspirations every 5 mL.  Performed by: Personally  Anesthesiologist: Bethena Midgetddono, Eldred Lievanos, MD  Additional Notes: Functioning IV was confirmed and monitors were applied.  A 50mm 22ga Arrow echogenic stimulator needle was used. Sterile prep and drape,hand hygiene and sterile gloves were used. Ultrasound guidance: relevant anatomy identified, needle position confirmed, local anesthetic spread visualized around nerve(s)., vascular puncture avoided.  Image printed for medical record. Negative aspiration and negative test dose prior to incremental administration of local anesthetic. The patient tolerated the procedure well.

## 2018-02-10 NOTE — Interval H&P Note (Signed)
History and Physical Interval Note:  02/10/2018 8:53 AM  Melissa [name]  has presented today for surgery, with the diagnosis of LOOSE LEFT TOTAL KNEE ARTHROPLASTY  LATERAL UNI  The various methods of treatment have been discussed with the patient and family. After consideration of risks, benefits and other options for treatment, the patient has consented to  Procedure(s): TOTAL KNEE REVISION (Left) as a surgical intervention .  The patient's history has been reviewed, patient examined, no change in status, stable for surgery.  I have reviewed the patient's chart and labs.  Questions were answered to the patient's satisfaction.     Nestor LewandowskyFrank J Charliene Inoue

## 2018-02-10 NOTE — Anesthesia Preprocedure Evaluation (Addendum)
Anesthesia Evaluation  Patient identified by MRN, date of birth, ID band Patient awake    Reviewed: Allergy & Precautions, NPO status , Patient's Chart, lab work & pertinent test results  History of Anesthesia Complications Negative for: history of anesthetic complications  Airway Mallampati: II       Dental  (+) Chipped   Pulmonary asthma , Current Smoker,    Pulmonary exam normal        Cardiovascular negative cardio ROS Normal cardiovascular exam Rate:Normal     Neuro/Psych  Headaches, Seizures - (as a baby), Well Controlled,  PSYCHIATRIC DISORDERS Depression    GI/Hepatic negative GI ROS, Neg liver ROS,   Endo/Other  negative endocrine ROS  Renal/GU negative Renal ROS     Musculoskeletal  (+) Arthritis , Osteoarthritis,    Abdominal   Peds  Hematology  (+) anemia ,   Anesthesia Other Findings C/T 6/19 IMPRESSION: 1. Solid fusion across the disc space at L4-5. 2. There is no fusion across the posterior elements of L4-5. 3. No residual or recurrent stenosis at L4-5. 4. Shallow central disc protrusion and mild bilateral facet hypertrophy at L5-S1 without focal stenosis. 5. Mild disc bulging and facet hypertrophy at L3-4 without focal stenosis.  BEST L/1-3  Reproductive/Obstetrics                           Anesthesia Physical  Anesthesia Plan  ASA: III  Anesthesia Plan: General   Post-op Pain Management: GA combined w/ Regional for post-op pain   Induction: Intravenous  PONV Risk Score and Plan: 1 and Ondansetron and Treatment may vary due to age or medical condition  Airway Management Planned: Nasal Cannula  Additional Equipment:   Intra-op Plan:   Post-operative Plan:   Informed Consent: I have reviewed the patients History and Physical, chart, labs and discussed the procedure including the risks, benefits and alternatives for the proposed anesthesia with the  patient or authorized representative who has indicated his/her understanding and acceptance.     Plan Discussed with: CRNA, Anesthesiologist and Surgeon  Anesthesia Plan Comments:        Anesthesia Quick Evaluation
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
## 2018-02-10 NOTE — Discharge Instructions (Addendum)

## 2018-02-10 NOTE — Care Management (Signed)
Received a call from Melissa [name] with [facility] home [facility]. Patient has been prearranged for HHPT with [facility].   Ronny FlurryHeather Moya Duan RN BSN [phone]

## 2018-02-10 NOTE — Progress Notes (Signed)
Pt arrived to room 5N26 via bed from PACU. See assessment. Will continue to monitor.

## 2018-02-10 NOTE — Anesthesia Procedure Notes (Signed)
Spinal  Patient location during procedure: OR Start time: 02/10/2018 10:12 AM End time: 02/10/2018 10:15 AM Staffing Anesthesiologist: Bethena Midgetddono, Juanmanuel Marohl, MD Preanesthetic Checklist Completed: patient identified, site marked, surgical consent, pre-op evaluation, timeout performed, IV checked, risks and benefits discussed and monitors and equipment checked Spinal Block Patient position: sitting Prep: DuraPrep Patient monitoring: heart rate, cardiac monitor, continuous pulse ox and blood pressure Approach: midline Location: L2-3 Injection technique: single-shot Needle Needle type: Sprotte  Needle gauge: 24 G Needle length: 9 cm Assessment Sensory level: T4

## 2018-02-10 NOTE — Op Note (Addendum)
PATIENT ID:      Melissa [name]  MRN:     [id number] DOB/AGE:    [birth year] / 42 y.o.       OPERATIVE REPORT    DATE OF PROCEDURE:  02/10/2018       PREOPERATIVE DIAGNOSIS:   Unstable left knee arthroplasty with pain                                                          POSTOPERATIVE DIAGNOSIS:   Unstable left knee arthroplasty with pain                                                                   PROCEDURE:  Procedure(s): Revision left total knee arthroplasty, using DepuyAttune RP implants #5R Femur, #5Tibia, 5 mm Attune RP bearing, 35 Patella     SURGEON: Nestor LewandowskyFrank J Khaniya Tenaglia    ASSISTANT:   Tomi LikensEric K. [employer] PA-C   (Present and scrubbed throughout the case, critical for assistance with exposure, retraction, instrumentation, and closure.)         ANESTHESIA: Spinal, 20cc Exparel, 50cc 0.25% Marcaine  EBL: 300cc  FLUID REPLACEMENT: 1600cc crystalloid  TOURNIQUET TIME: 15min  Drains: None  Tranexamic Acid: 1gm IV, 2gm topical  COMPLICATIONS:  None         INDICATIONS FOR PROCEDURE: The patient Had knee arthroplasty done elsewhere a year and a half ago with persistent pain ever since surgery.  Plain radiographs show that the tibial component is anteriorly sloped And this may be leading to pain with instability. .  Risks and benefits of surgery have been discussed, questions answered.   DESCRIPTION OF PROCEDURE: The patient identified by armband, received  IV antibiotics, [facility]. Patient taken to the operating room, appropriate anesthetic monitors were attached, and Spinal anesthesia was  induced. Tourniquet applied high to the operative thigh. Lateral post and foot positioner applied to the table, the lower extremity was then prepped and draped  in usual sterile fashion from the toes to the tourniquet. Time-out procedure was performed. We began the operation, with the knee flexed 120 degrees, by making the anterior midline incision starting at handbreadth  above the patella going over the patella 1 cm medial to and 4 cm distal to the tibial tubercle. Small bleeders in the skin and the  subcutaneous tissue identified and cauterized. Transverse retinaculum was incised and reflected medially and a medial parapatellar arthrotomy was accomplished. the patella was everted and theprepatellar fat pad resected. The superficial medial collateral ligament was then elevated from anterior to posterior along the proximal flare of the tibia and anterior half of the menisci resected. The knee was hyperflexed exposing bone on bone arthritis. Peripheral and notch osteophytes as well as the cruciate ligaments were then resected. We continued to work our way around posteriorly along the proximal tibia, and externally  rotated the tibia subluxing it out from underneath the femur. A McHale  retractor was placed through the notch and a lateral PhotographerHohmann retractor  Placed. This exposed the tibial component, which was then  lifted off of the tibial bed using osteotomes, rongeurs and small curettes.  The tibial component came off relatively easily and may have had some micromotion. We then drilled through the proximal tibia in line with the axis of the tibia followed by an intramedullary guide rod and 2-degree posterior slope cutting guide. The tibial cutting guide, 3 degree posterior sloped, was pinned into place allowing resection of  0 mm of bone laterally, And 7 mm of bone medially.. Satisfied with the tibial resection, we then.We then directed our attention of the femur and using osteotomes and curettes broke the interface between the femoral component and the cement and removed, and the femoral component. entered the distal femur 2 mm anterior to the PCL origin with the  intramedullary guide rod and applied the distal femoral cutting guide  set at 9 mm of medial reference and 0 mm Lateral reference, with 6 degrees of valgus. This was pinned along the  epicondylar axis. At this  point, the distal femoral cut was accomplished without difficulty. We then sized for a #5R femoral component and pinned the guide in 0 degrees of external rotation. The chamfer cutting guide was pinned into place. The anterior, posterior, and chamfer cuts were accomplished without difficulty followed by the Attune RP box cutting guide and the box cut. We also removed posterior osteophytes from the posterior femoral condyles. At this  time, the knee was brought into full extension. We checked our  extension and flexion gaps and found them symmetric for a 5 mm bearing. Distracting in extension with a lamina spreader, the posterior horns of the menisci were removed, and Exparel, diluted to 60 cc, with 20cc NS, and 20cc 0.5% Marcaine,was injected into the capsule and synovium of the knee. The posterior patella cut was accomplished with the 9.5 mm Attune cutting guide, sized for a 35mm dome, and the fixation pegs drilled.The knee  was then once again hyperflexed exposing the proximal tibia. We sized for a # 5 tibial base plate, applied the smokestack and the conical reamer followed by the the Delta fin keel punch. We then hammered into place the Attune RP trial femoral component, drilled the lugs, inserted a  5 mm trial bearing, trial patellar button, and took the knee through range of motion from 0-130 degrees. No thumb pressure was required for patellar Tracking. At this point, the limb was wrapped with an Esmarch bandage and the tourniquet inflated to 350 mmHg. All trial components were removed, mating surfaces irrigated with pulse lavage, and dried with suction and sponges. 10 cc of the Exparel solution was applied to the cancellus bone of the patella distal femur and proximal tibia.  After waiting 1 minute, the bony surfaces were again, dried with sponges. A double batch of DePuy HV cement with 1500 mg of Zinacef was mixed and applied to all bony metallic mating surfaces except for the posterior condyles of the  femur itself. In order, we hammered into place the tibial tray and removed excess cement, the femoral component and removed excess cement. The final Attune RP bearing  was inserted, and the knee brought to full extension with compression.  The patellar button was clamped into place, and excess cement  removed. While the cement cured the wound was irrigated out with normal saline solution pulse lavage. Ligament stability and patellar tracking were checked and found to be excellent. The parapatellar arthrotomy was closed with  running #1 Vicryl suture. The subcutaneous tissue with 0 and 2-0 undyed  Vicryl suture,  and the skin with running 3-0 SQ vicryl. A dressing of Xeroform,  4 x 4, dressing sponges, Webril, and Ace wrap applied. The patient  awakened, and taken to recovery room without difficulty.   Nestor Lewandowsky 02/10/2018, 11:39 AM

## 2018-02-10 NOTE — Transfer of Care (Signed)
Immediate Anesthesia Transfer of Care Note  Patient: Melissa [name]  Procedure(s) Performed: Procedure(s): TOTAL KNEE REVISION (Left)  Patient Location: PACU  Anesthesia Type:Spinal  Level of Consciousness:  sedated, patient cooperative and responds to stimulation  Airway & Oxygen Therapy:Patient Spontanous Breathing and Patient connected to face mask oxgen  Post-op Assessment:  Report given to PACU RN and Post -op Vital signs reviewed and stable  Post vital signs:  Reviewed and stable  Last Vitals:  Vitals:   02/10/18 0855 02/10/18 0900  BP: 104/64 116/64  Pulse: 63 70  Resp: 18 11  Temp:    SpO2: 100% 100%    Complications: No apparent anesthesia complications

## 2018-02-10 NOTE — Progress Notes (Signed)
Orthopedic Tech Progress Note Patient Details:  Melissa [name] 1976-06-30 [id number]  Ortho Devices Type of Ortho Device: Bone foam zero knee Ortho Device/Splint Location: lle Ortho Device/Splint Interventions: Application   Post Interventions Patient Tolerated: Well Instructions Provided: Care of device   Nikki DomCrawford, Kaleel Schmieder 02/10/2018, 3:07 PM

## 2018-02-10 NOTE — Plan of Care (Signed)
Problem: Education: Goal: Knowledge of General Education information will improve Description Including pain rating scale, medication(s)/side effects and non-pharmacologic comfort measures Outcome: Progressing   Problem: Clinical Measurements: Goal: Will remain free from infection Outcome: Progressing   Problem: Nutrition: Goal: Adequate nutrition will be maintained Outcome: Progressing   Problem: Coping: Goal: Level of anxiety will decrease Outcome: Progressing   Problem: Pain Managment: Goal: General experience of comfort will improve Outcome: Progressing   Problem: Safety: Goal: Ability to remain free from injury will improve Outcome: Progressing   Problem: Education: Goal: Knowledge of the prescribed therapeutic regimen will improve Outcome: Progressing   Problem: Activity: Goal: Ability to avoid complications of mobility impairment will improve Outcome: Progressing   Problem: Pain Management: Goal: Pain level will decrease with appropriate interventions Outcome: Progressing

## 2018-02-10 NOTE — Evaluation (Signed)
Physical Therapy Evaluation Patient Details Name: Melissa [name] MRN: [id number] DOB: [birth year] Today's Date: 02/10/2018   History of Present Illness  Pt is a 42 y/o female s/p L total knee revision. PMH including but not limited to anemia and depression  Clinical Impression  Pt presented supine in bed with HOB elevated, awake and willing to participate in therapy session. Prior to admission, pt reported that she was independent with all functional mobility and ADLs. Pt will have 24/7 supervision/assistance upon d/c. Pt currently able to perform bed mobility with supervision, transfers with min guard and short distance ambulation with min guard and RW. Pt would continue to benefit from skilled physical therapy services at this time while admitted and after d/c to address the below listed limitations in order to improve overall safety and independence with functional mobility.     Follow Up Recommendations Home health PT;Supervision/Assistance - 24 hour    Equipment Recommendations  Rolling walker with 5" wheels    Recommendations for Other Services       Precautions / Restrictions Precautions Precautions: Fall;Knee Restrictions Weight Bearing Restrictions: Yes LLE Weight Bearing: Weight bearing as tolerated      Mobility  Bed Mobility Overal bed mobility: Needs Assistance Bed Mobility: Supine to Sit;Sit to Supine     Supine to sit: Supervision Sit to supine: Supervision   General bed mobility comments: increased time and effort, supervision for safety  Transfers Overall transfer level: Needs assistance Equipment used: Rolling walker (2 wheeled) Transfers: Sit to/from Stand Sit to Stand: Min guard         General transfer comment: good technique, min guard for safety  Ambulation/Gait Ambulation/Gait assistance: Min guard Gait Distance (Feet): 15 Feet Assistive device: Rolling walker (2 wheeled) Gait Pattern/deviations: Step-to pattern;Step-through  pattern;Decreased step length - left;Decreased step length - right;Decreased stride length;Decreased stance time - left;Decreased weight shift to left Gait velocity: decreased Gait velocity interpretation: <1.31 ft/sec, indicative of household ambulator General Gait Details: pt with modest instability but no overt LOB or need for physical assistance, min guard for safety; pt very limited secondary to pain  Stairs            Wheelchair Mobility    Modified Rankin (Stroke Patients Only)       Balance                                             Pertinent Vitals/Pain Pain Assessment: Faces Faces Pain Scale: Hurts whole lot Pain Location: L knee Pain Descriptors / Indicators: Sore Pain Intervention(s): Monitored during session;Repositioned    Home Living Family/patient expects to be discharged to:: Private residence Living Arrangements: Spouse/significant other;Parent Available Help at Discharge: Family;Available 24 hours/day Type of Home: House Home Access: Stairs to enter Entrance Stairs-Rails: Doctor, general practiceight;Left Entrance Stairs-Number of Steps: 3-4 Home Layout: One level Home Equipment: Crutches      Prior Function Level of Independence: Independent               Hand Dominance        Extremity/Trunk Assessment   Upper Extremity Assessment Upper Extremity Assessment: Overall WFL for tasks assessed    Lower Extremity Assessment Lower Extremity Assessment: LLE deficits/detail LLE Deficits / Details: pt with decreased strength and ROM limitations secondary to post-op pain and weakness    Cervical / Trunk Assessment Cervical / Trunk Assessment: Normal  Communication  Communication: No difficulties  Cognition Arousal/Alertness: Awake/alert Behavior During Therapy: WFL for tasks assessed/performed Overall Cognitive Status: Within Functional Limits for tasks assessed                                        General Comments       Exercises     Assessment/Plan    PT Assessment Patient needs continued PT services  PT Problem List Decreased strength;Decreased range of motion;Decreased activity tolerance;Decreased balance;Decreased mobility;Decreased coordination;Decreased knowledge of use of DME;Decreased safety awareness;Decreased knowledge of precautions;Pain       PT Treatment Interventions DME instruction;Functional mobility training;Gait training;Stair training;Therapeutic activities;Therapeutic exercise;Balance training;Neuromuscular re-education;Patient/family education    PT Goals (Current goals can be found in the Care Plan section)  Acute Rehab PT Goals Patient Stated Goal: decrease pain PT Goal Formulation: With patient Time For Goal Achievement: 02/24/18 Potential to Achieve Goals: Good    Frequency 7X/week   Barriers to discharge        Co-evaluation               AM-PAC PT "6 Clicks" Daily Activity  Outcome Measure Difficulty turning over in bed (including adjusting bedclothes, sheets and blankets)?: A Little Difficulty moving from lying on back to sitting on the side of the bed? : A Little Difficulty sitting down on and standing up from a chair with arms (e.g., wheelchair, bedside commode, etc,.)?: Unable Help needed moving to and from a bed to chair (including a wheelchair)?: A Little Help needed walking in hospital room?: A Little Help needed climbing 3-5 steps with a railing? : A Little 6 Click Score: 16    End of Session Equipment Utilized During Treatment: Gait belt Activity Tolerance: Patient limited by pain Patient left: in bed;with call bell/phone within reach Nurse Communication: Mobility status PT Visit Diagnosis: Other abnormalities of gait and mobility (R26.89);Pain Pain - Right/Left: Left Pain - part of body: Knee    Time: 0981-1914 PT Time Calculation (min) (ACUTE ONLY): 21 min   Charges:   PT Evaluation $PT Eval Moderate Complexity: [address], [place], [place] [phone]   Alessandra Bevels Ily Denno 02/10/2018, 6:27 PM

## 2018-02-11 LAB — CBC
HCT: 31.4 % — ABNORMAL LOW (ref 36.0–46.0)
Hemoglobin: 10.2 g/dL — ABNORMAL LOW (ref 12.0–15.0)
MCH: 31.8 pg (ref 26.0–34.0)
MCHC: 32.5 g/dL (ref 30.0–36.0)
MCV: 97.8 fL (ref 78.0–100.0)
PLATELETS: 160 10*3/uL (ref 150–400)
RBC: 3.21 MIL/uL — ABNORMAL LOW (ref 3.87–5.11)
RDW: 11.6 % (ref 11.5–15.5)
WBC: 10.7 10*3/uL — ABNORMAL HIGH (ref 4.0–10.5)

## 2018-02-11 LAB — BASIC METABOLIC PANEL
Anion gap: 9 (ref 5–15)
BUN: 8 mg/dL (ref 6–20)
CO2: 18 mmol/L — ABNORMAL LOW (ref 22–32)
CREATININE: 0.8 mg/dL (ref 0.44–1.00)
Calcium: 8.4 mg/dL — ABNORMAL LOW (ref 8.9–10.3)
Chloride: 106 mmol/L (ref 98–111)
GFR calc Af Amer: >60 mL/min (ref >60)
GLUCOSE: 120 mg/dL — AB (ref 70–99)
Potassium: 4 mmol/L (ref 3.5–5.1)
SODIUM: 133 mmol/L — AB (ref 135–145)

## 2018-02-11 MED ORDER — METHOCARBAMOL 750 MG PO TABS
750.0000 mg | ORAL_TABLET | Freq: Four times a day (QID) | ORAL | Status: DC | PRN
  Administered 2018-02-12 – 2018-02-13 (×4): 750 mg via ORAL
  Filled 2018-02-11 (×5): qty 1

## 2018-02-11 MED ORDER — METHOCARBAMOL 1000 MG/10ML IJ SOLN
500.0000 mg | Freq: Four times a day (QID) | INTRAVENOUS | Status: DC | PRN
  Filled 2018-02-11: qty 5

## 2018-02-11 MED ORDER — HYDROMORPHONE HCL 1 MG/ML IJ SOLN
0.5000 mg | INTRAMUSCULAR | Status: DC | PRN
  Administered 2018-02-11 – 2018-02-13 (×6): 1 mg via INTRAVENOUS
  Filled 2018-02-11 (×6): qty 1

## 2018-02-11 NOTE — Progress Notes (Signed)
Physical Therapy Treatment Patient Details Name: Melissa [name] MRN: [id number] DOB: 08-Sep-1975 Today's Date: 02/11/2018    History of Present Illness Pt is a 42 y/o female s/p L total knee revision. PMH including but not limited to anemia and depression    PT Comments    Patient limited by pain this am. Pt is able to ambulate short distance in room to get to restroom and to recliner with heavy reliance on RW. Pt unable to achieve L foot flat during stance phase due to pain in L LE. RN present during session to give pain medication and therapist applied ice packs. Continue to progress as tolerated.    Follow Up Recommendations  Home health PT;Supervision/Assistance - 24 hour     Equipment Recommendations  Rolling walker with 5" wheels    Recommendations for Other Services       Precautions / Restrictions Precautions Precautions: Fall;Knee Precaution Comments: reviewed precautions/positioning with pt Restrictions Weight Bearing Restrictions: Yes LLE Weight Bearing: Weight bearing as tolerated    Mobility  Bed Mobility               General bed mobility comments: pt sitting EOB with RN present upon arrival and in recliner end of session  Transfers Overall transfer level: Needs assistance Equipment used: Rolling walker (2 wheeled) Transfers: Sit to/from Stand Sit to Stand: Min guard         General transfer comment: pt stood from EOB and commode with use of grab bar; min guard for safety given pt's pain level at the time; assist to steady RW at times  Ambulation/Gait Ambulation/Gait assistance: Min guard Gait Distance (Feet): (50ft then 32ft) Assistive device: Rolling walker (2 wheeled) Gait Pattern/deviations: Step-to pattern;Decreased step length - left;Decreased step length - right;Decreased stance time - left;Decreased weight shift to left;Decreased dorsiflexion - left;Antalgic;Trunk flexed Gait velocity: decreased   General Gait Details: pt unable to  achieve L heel strike/foot flat with heavy reliance on RW; cues for posture, breathing technique, and L knee extension during stance phase   Stairs             Wheelchair Mobility    Modified Rankin (Stroke Patients Only)       Balance Overall balance assessment: Needs assistance   Sitting balance-Leahy Scale: Good     Standing balance support: Bilateral upper extremity supported Standing balance-Leahy Scale: Poor                              Cognition Arousal/Alertness: Awake/alert Behavior During Therapy: WFL for tasks assessed/performed Overall Cognitive Status: Within Functional Limits for tasks assessed                                        Exercises      General Comments        Pertinent Vitals/Pain Pain Assessment: Faces Faces Pain Scale: Hurts worst Pain Location: L knee Pain Descriptors / Indicators: Sore;Grimacing;Guarding;Moaning Pain Intervention(s): Limited activity within patient's tolerance;Monitored during session;Repositioned;Patient requesting pain meds-RN notified;Premedicated before session;Ice applied    Home Living                      Prior Function            PT Goals (current goals can now be found in the care plan section) Acute Rehab PT  Goals Patient Stated Goal: decrease pain PT Goal Formulation: With patient Time For Goal Achievement: 02/24/18 Potential to Achieve Goals: Good Progress towards PT goals: Progressing toward goals    Frequency    7X/week      PT Plan Current plan remains appropriate    Co-evaluation              AM-PAC PT "6 Clicks" Daily Activity  Outcome Measure  Difficulty turning over in bed (including adjusting bedclothes, sheets and blankets)?: A Little Difficulty moving from lying on back to sitting on the side of the bed? : A Little Difficulty sitting down on and standing up from a chair with arms (e.g., wheelchair, bedside commode, etc,.)?:  Unable Help needed moving to and from a bed to chair (including a wheelchair)?: A Little Help needed walking in hospital room?: A Little Help needed climbing 3-5 steps with a railing? : Total 6 Click Score: 14    End of Session Equipment Utilized During Treatment: Gait belt Activity Tolerance: Patient limited by pain Patient left: with call bell/phone within reach;in chair;with nursing/sitter in room Nurse Communication: Mobility status PT Visit Diagnosis: Other abnormalities of gait and mobility (R26.89);Pain Pain - Right/Left: Left Pain - part of body: Knee     Time: 0940-1007 PT Time Calculation (min) (ACUTE ONLY): 27 min  Charges:  $Gait Training: 8-22 mins $Therapeutic Activity: 8-22 mins                     Erline LevineKellyn Hipolito Martinezlopez, PTA Pager: [phone]     Carolynne EdouardKellyn R Cherl Gorney 02/11/2018, 10:25 AM

## 2018-02-11 NOTE — Care Management Note (Addendum)
Case Management Note  Patient Details  Name: [name] MRN: [id number] Date of Birth: 1976/01/24  Subjective/Objective:        [facility].  Pt states she does not need a 3n1, as she already owns one.  Pt does need a RW.             Action/Plan[facility] to [facility].  Expected Discharge Date:                  Expected Discharge Plan:  Home w Home Health Services  In-House Referral:  NA  Discharge planning Services  CM Consult  Post Acute Care Choice:  Durable Medical Equipment Choice offered to:  Patient  DME Arranged:  Walker rolling DME Agency:  [facility]  [facility]  Status of Service:  Completed, signed off  If discussed at Long Length of Stay Meetings, dates discussed:    Additional Comments:  Deveron Furlongshley  Shamon Cothran, RN 02/11/2018, 12:48 PM

## 2018-02-11 NOTE — Care Management (Deleted)
Pt states that she does need a RW at home.  She already has a 3n1.  [facility].

## 2018-02-11 NOTE — Progress Notes (Signed)
Physical Therapy Treatment Patient Details Name: Melissa [name] MRN: [id number] DOB: [birth year] Today's Date: 02/11/2018    History of Present Illness Pt is a 42 y/o female s/p L total knee revision. PMH including but not limited to anemia and depression    PT Comments    Patient continues to be limited by L knee pain. Pt able to ambulate short distance in room. Suspect pt with progress well once pain is decreased. Continue to progress as tolerated.    Follow Up Recommendations  Home health PT;Supervision/Assistance - 24 hour     Equipment Recommendations  Rolling walker with 5" wheels    Recommendations for Other Services       Precautions / Restrictions Precautions Precautions: Fall;Knee Precaution Comments: reviewed precautions/positioning with pt Restrictions Weight Bearing Restrictions: Yes LLE Weight Bearing: Weight bearing as tolerated    Mobility  Bed Mobility Overal bed mobility: Needs Assistance Bed Mobility: Supine to Sit;Sit to Supine     Supine to sit: Min assist Sit to supine: Min assist   General bed mobility comments: assist to mobilize L LE   Transfers Overall transfer level: Needs assistance Equipment used: Rolling walker (2 wheeled) Transfers: Sit to/from Stand Sit to Stand: Min guard         General transfer comment: for safety  Ambulation/Gait Ambulation/Gait assistance: Min guard Gait Distance (Feet): (~3516ft in room) Assistive device: Rolling walker (2 wheeled) Gait Pattern/deviations: Step-to pattern;Decreased step length - left;Decreased step length - right;Decreased stance time - left;Decreased weight shift to left;Decreased dorsiflexion - left;Antalgic;Trunk flexed Gait velocity: decreased   General Gait Details: pt with ability to extend L LE and place heel on floor this session however still limited by pain and with heavy reliance on bilat UE to offload L LE   Stairs             Wheelchair Mobility    Modified Rankin  (Stroke Patients Only)       Balance Overall balance assessment: Needs assistance   Sitting balance-Leahy Scale: Good     Standing balance support: Bilateral upper extremity supported Standing balance-Leahy Scale: Poor                              Cognition Arousal/Alertness: Awake/alert Behavior During Therapy: WFL for tasks assessed/performed Overall Cognitive Status: Within Functional Limits for tasks assessed                                        Exercises Total Joint Exercises Ankle Circles/Pumps: AROM;Left;20 reps    General Comments        Pertinent Vitals/Pain Pain Assessment: Faces Faces Pain Scale: Hurts whole lot Pain Location: L knee Pain Descriptors / Indicators: Sore;Grimacing;Guarding;Moaning Pain Intervention(s): Limited activity within patient's tolerance;Monitored during session;Premedicated before session;Repositioned;Ice applied    Home Living                      Prior Function            PT Goals (current goals can now be found in the care plan section) Acute Rehab PT Goals Patient Stated Goal: decrease pain PT Goal Formulation: With patient Time For Goal Achievement: 02/24/18 Potential to Achieve Goals: Good Progress towards PT goals: Progressing toward goals    Frequency    7X/week      PT Plan  Current plan remains appropriate    Co-evaluation              AM-PAC PT "6 Clicks" Daily Activity  Outcome Measure  Difficulty turning over in bed (including adjusting bedclothes, sheets and blankets)?: A Little Difficulty moving from lying on back to sitting on the side of the bed? : A Lot Difficulty sitting down on and standing up from a chair with arms (e.g., wheelchair, bedside commode, etc,.)?: Unable Help needed moving to and from a bed to chair (including a wheelchair)?: A Little Help needed walking in hospital room?: A Little Help needed climbing 3-5 steps with a railing? :  Total 6 Click Score: 13    End of Session Equipment Utilized During Treatment: Gait belt Activity Tolerance: Patient limited by pain Patient left: with call bell/phone within reach;in bed Nurse Communication: Mobility status PT Visit Diagnosis: Other abnormalities of gait and mobility (R26.89);Pain Pain - Right/Left: Left Pain - part of body: Knee     Time: 4540-9811 PT Time Calculation (min) (ACUTE ONLY): 23 min  Charges:  $Gait Training: 8-22 mins $Therapeutic Activity: 8-22 mins                     Erline Levine, PTA Pager: [phone]     Carolynne Edouard 02/11/2018, 4:06 PM

## 2018-02-11 NOTE — Progress Notes (Signed)
Subjective: 1 Day Post-Op Procedure(s) (LRB): TOTAL KNEE REVISION (Left)  Activity level:  wbat Diet tolerance:  ok Voiding:  ok Patient reports pain as moderate.    Objective: Vital signs in last 24 hours: Temp:  [97.7 F (36.5 C)-98.4 F (36.9 C)] 98.3 F (36.8 C) (08/10 0425) Pulse Rate:  [56-70] 65 (08/10 0425) Resp:  [11-24] 16 (08/10 0425) BP: (98-131)/(59-72) 117/70 (08/10 0425) SpO2:  [97 %-100 %] 97 % (08/10 0425)  Labs: Recent Labs    02/11/18 0404  HGB 10.2*   Recent Labs    02/11/18 0404  WBC 10.7*  RBC 3.21*  HCT 31.4*  PLT 160   Recent Labs    02/11/18 0404  NA 133*  K 4.0  CL 106  CO2 18*  BUN 8  CREATININE 0.80  GLUCOSE 120*  CALCIUM 8.4*   No results for input(s): LABPT, INR in the last 72 hours.  Physical Exam:  Neurologically intact ABD soft Neurovascular intact Sensation intact distally Intact pulses distally Dorsiflexion/Plantar flexion intact Incision: dressing C/D/I and no drainage No cellulitis present Compartment soft  Assessment/Plan:  1 Day Post-Op Procedure(s) (LRB): TOTAL KNEE REVISION (Left) Advance diet Up with therapy Plan for discharge tomorrow Discharge home with home health if doing well and cleared by PT. If no then Monday. Follow up in office 2 weeks post op With Dr. Turner Danielsowan. Continue Eliquis for DVT prevention. Anticipated LOS equal to or greater than 2 midnights due to - Age 42 and older with one or more of the following:  - Obesity  - Expected need for hospital services (PT, OT, Nursing) required for safe  discharge  - Anticipated need for postoperative skilled nursing care or inpatient rehab  - Active co-morbidities: None OR   - Unanticipated findings during/Post Surgery: Slow post-op progression: GI, pain control, mobility  - Patient is a high risk of re-admission due to: None  Amandalynn Pitz, Ginger OrganNDREW PAUL 02/11/2018, 8:27 AM

## 2018-02-11 NOTE — Progress Notes (Signed)
Informed Elodia FlorenceAndrew Nida, PA that patient is having an excruciating amount of pain in left knee that is getting progressively worse despite pain medication regmine. . Informed PA that the knee is swollen and has red areas noted to the inside of the knee. Per PA increase PRN dilaudid 0.5mg -1mg  IV from Q4hr to Q3hr and Increase Oral robaxin dose to 750mg  instead of 500mg .

## 2018-02-12 LAB — CBC
HCT: 28.1 % — ABNORMAL LOW (ref 36.0–46.0)
HEMOGLOBIN: 9.3 g/dL — AB (ref 12.0–15.0)
MCH: 31 pg (ref 26.0–34.0)
MCHC: 33.1 g/dL (ref 30.0–36.0)
MCV: 93.7 fL (ref 78.0–100.0)
PLATELETS: 167 10*3/uL (ref 150–400)
RBC: 3 MIL/uL — AB (ref 3.87–5.11)
RDW: 11.3 % — ABNORMAL LOW (ref 11.5–15.5)
WBC: 9.2 10*3/uL (ref 4.0–10.5)

## 2018-02-12 NOTE — Plan of Care (Signed)
  Problem: Activity: Goal: Ability to avoid complications of mobility impairment will improve Outcome: Progressing   Problem: Pain Management: Goal: Pain level will decrease with appropriate interventions Outcome: Progressing   Problem: Skin Integrity: Goal: Will show signs of wound healing Outcome: Progressing   

## 2018-02-12 NOTE — Progress Notes (Signed)
Subjective: 2 Days Post-Op Procedure(s) (LRB): TOTAL KNEE REVISION (Left)   Patient feels a little better this morning but is still in quite significant pain. Her goal is to go home as soon as she safely can. She is smiling a little bit this morning which is an improvement.  Activity level:  wbat Diet tolerance:  ok Voiding:  ok Patient reports pain as moderate.    Objective: Vital signs in last 24 hours: Temp:  [98.3 F (36.8 C)-98.8 F (37.1 C)] 98.5 F (36.9 C) (08/11 0547) Pulse Rate:  [77-92] 92 (08/11 0547) Resp:  [13-16] 16 (08/11 0547) BP: (118-138)/(72-81) 118/72 (08/11 0547) SpO2:  [96 %-100 %] 100 % (08/11 0547)  Labs: Recent Labs    02/11/18 0404  HGB 10.2*   Recent Labs    02/11/18 0404  WBC 10.7*  RBC 3.21*  HCT 31.4*  PLT 160   Recent Labs    02/11/18 0404  NA 133*  K 4.0  CL 106  CO2 18*  BUN 8  CREATININE 0.80  GLUCOSE 120*  CALCIUM 8.4*   No results for input(s): LABPT, INR in the last 72 hours.  Physical Exam:  Neurologically intact ABD soft Neurovascular intact Sensation intact distally Intact pulses distally Dorsiflexion/Plantar flexion intact Incision: dressing C/D/I and no drainage No cellulitis present Compartment soft  Assessment/Plan:  2 Days Post-Op Procedure(s) (LRB): TOTAL KNEE REVISION (Left) Advance diet Up with therapy Plan for discharge tomorrow Discharge home with home health if doing well and cleared by PT. Follow up in office 2 weeks post op. Continue on Eliquis for DVT prevention Anticipated LOS equal to or greater than 2 midnights due to - Age 42 and older with one or more of the following:  - Obesity  - Expected need for hospital services (PT, OT, Nursing) required for safe  discharge  - Anticipated need for postoperative skilled nursing care or inpatient rehab  - Active co-morbidities: None OR   - Unanticipated findings during/Post Surgery: Slow post-op progression: GI, pain control, mobility  -  Patient is a high risk of re-admission due to: None  Burlin Mcnair, Ginger OrganNDREW PAUL 02/12/2018, 7:49 AM

## 2018-02-12 NOTE — Plan of Care (Signed)
  Problem: Activity: Goal: Ability to avoid complications of mobility impairment will improve Outcome: Progressing   Problem: Clinical Measurements: Goal: Postoperative complications will be avoided or minimized Outcome: Progressing   Problem: Pain Management: Goal: Pain level will decrease with appropriate interventions Outcome: Progressing   

## 2018-02-12 NOTE — Progress Notes (Signed)
Physical Therapy Treatment Patient Details Name: Melissa [name] MRN: [id number] DOB: December 15, 1975 Today's Date: 02/12/2018    History of Present Illness Pt is a 42 y/o female s/p L total knee revision. PMH including but not limited to anemia and depression    PT Comments    Pt presents at EOB when PT arrives. Pt is able to perform transfers and gait with improved sequencing but continues to be limited by pain. Instructed pt on the importance of icing and continuing to move LLE in order to improve mobility and reduce significant decline.     Follow Up Recommendations  Home health PT     Equipment Recommendations  Rolling walker with 5" wheels    Recommendations for Other Services       Precautions / Restrictions Precautions Precautions: Fall;Knee Precaution Booklet Issued: Yes (comment) Precaution Comments: reviewed precautions/positioning with pt Restrictions Weight Bearing Restrictions: Yes LLE Weight Bearing: Weight bearing as tolerated    Mobility  Bed Mobility Overal bed mobility: Needs Assistance Bed Mobility: Sit to Supine       Sit to supine: Min assist   General bed mobility comments: assist to mobilize L LE   Transfers Overall transfer level: Needs assistance Equipment used: Rolling walker (2 wheeled) Transfers: Sit to/from Stand Sit to Stand: Min guard         General transfer comment: for safety  Ambulation/Gait Ambulation/Gait assistance: Min guard Gait Distance (Feet): 65 Feet Assistive device: Rolling walker (2 wheeled) Gait Pattern/deviations: Step-to pattern;Decreased step length - left;Decreased step length - right;Decreased stance time - left;Decreased weight shift to left;Decreased dorsiflexion - left;Antalgic;Trunk flexed Gait velocity: decreased   General Gait Details: pt with improved gait distance tolerance and improved sequencing. Continues to have decreased heel strike LLE but improved cadence.    Stairs              Wheelchair Mobility    Modified Rankin (Stroke Patients Only)       Balance Overall balance assessment: Needs assistance Sitting-balance support: Feet supported;No upper extremity supported Sitting balance-Leahy Scale: Good     Standing balance support: Bilateral upper extremity supported Standing balance-Leahy Scale: Poor Standing balance comment: reliant on UE support in standing                            Cognition Arousal/Alertness: Awake/alert Behavior During Therapy: WFL for tasks assessed/performed Overall Cognitive Status: Within Functional Limits for tasks assessed                                        Exercises Total Joint Exercises Long Arc Quad: AAROM;Left;10 reps;Seated    General Comments        Pertinent Vitals/Pain Pain Assessment: 0-10 Pain Score: 6  Pain Location: L knee Pain Descriptors / Indicators: Sore;Grimacing;Guarding;Moaning Pain Intervention(s): Monitored during session;Premedicated before session;Repositioned;Ice applied    Home Living                      Prior Function            PT Goals (current goals can now be found in the care plan section) Acute Rehab PT Goals Patient Stated Goal: decrease pain Progress towards PT goals: Progressing toward goals    Frequency    7X/week      PT Plan Current plan remains appropriate  Co-evaluation              AM-PAC PT "6 Clicks" Daily Activity  Outcome Measure  Difficulty turning over in bed (including adjusting bedclothes, sheets and blankets)?: A Little Difficulty moving from lying on back to sitting on the side of the bed? : A Little Difficulty sitting down on and standing up from a chair with arms (e.g., wheelchair, bedside commode, etc,.)?: A Lot Help needed moving to and from a bed to chair (including a wheelchair)?: A Little Help needed walking in hospital room?: A Little Help needed climbing 3-5 steps with a railing? :  Total 6 Click Score: 15    End of Session Equipment Utilized During Treatment: Gait belt Activity Tolerance: Patient limited by pain Patient left: in bed;with call bell/phone within reach Nurse Communication: Mobility status PT Visit Diagnosis: Other abnormalities of gait and mobility (R26.89);Pain Pain - Right/Left: Left Pain - part of body: Knee     Time: 1001-1025 PT Time Calculation (min) (ACUTE ONLY): 24 min  Charges:  $Gait Training: 23-37 mins                     Colin BroachSabra M. Braxon Suder PT, DPT     Amire Leazer Aletha HalimMarie Lyzette Reinhardt 02/12/2018, 1:20 PM

## 2018-02-12 NOTE — Plan of Care (Signed)
  Problem: Activity: Goal: Ability to avoid complications of mobility impairment will improve Outcome: Progressing Goal: Range of joint motion will improve Outcome: Progressing   Problem: Pain Management: Goal: Pain level will decrease with appropriate interventions Outcome: Not Progressing

## 2018-02-12 NOTE — Progress Notes (Signed)
Physical Therapy Treatment Patient Details Name: Melissa [name] MRN: [id number] DOB: 1975-12-29 Today's Date: 02/12/2018    History of Present Illness Pt is a 42 y/o female s/p L total knee revision. PMH including but not limited to anemia and depression    PT Comments    Pt demonstrates increased edema and pain in left knee this session. Pt with decreased tolerance for weight bearing and knee flexion. Pt instructed to try using ice every 1-2 hours in order to assist with reducing edema and attempted to place towel roll under LLE with pt unable to tolerate. Pt reports 10/10 pain with towel roll in place. Pt was, however, able to progress gait distance with increased pain and became tearful following this session.     Follow Up Recommendations  Home health PT     Equipment Recommendations  Rolling walker with 5" wheels    Recommendations for Other Services       Precautions / Restrictions Precautions Precautions: Fall;Knee Precaution Booklet Issued: Yes (comment) Precaution Comments: reviewed precautions/positioning with pt Restrictions Weight Bearing Restrictions: Yes LLE Weight Bearing: Weight bearing as tolerated    Mobility  Bed Mobility Overal bed mobility: Needs Assistance Bed Mobility: Sit to Supine;Supine to Sit     Supine to sit: Supervision Sit to supine: Min assist   General bed mobility comments: assist to mobilize L LE   Transfers Overall transfer level: Needs assistance Equipment used: Rolling walker (2 wheeled) Transfers: Sit to/from Stand Sit to Stand: Min guard         General transfer comment: for safety  Ambulation/Gait Ambulation/Gait assistance: Min guard Gait Distance (Feet): 100 Feet Assistive device: Rolling walker (2 wheeled) Gait Pattern/deviations: Step-to pattern;Decreased step length - left;Decreased step length - right;Decreased stance time - left;Decreased weight shift to left;Decreased dorsiflexion - left;Antalgic;Trunk  flexed Gait velocity: decreased   General Gait Details: pt with improved gait distance tolerance and improved sequencing. Continues to have decreased heel strike LLE but improved cadence.    Stairs             Wheelchair Mobility    Modified Rankin (Stroke Patients Only)       Balance Overall balance assessment: Needs assistance Sitting-balance support: Feet supported;No upper extremity supported Sitting balance-Leahy Scale: Good     Standing balance support: Bilateral upper extremity supported Standing balance-Leahy Scale: Poor Standing balance comment: reliant on UE support in standing                            Cognition Arousal/Alertness: Awake/alert Behavior During Therapy: WFL for tasks assessed/performed Overall Cognitive Status: Within Functional Limits for tasks assessed                                        Exercises Total Joint Exercises Ankle Circles/Pumps: AROM;Left;20 reps Quad Sets: AROM;Left;10 reps;Supine Heel Slides: AAROM;Left;5 reps;Supine Long Arc Quad: AAROM;Left;10 reps;Seated    General Comments        Pertinent Vitals/Pain Pain Assessment: 0-10 Pain Score: 8  Pain Location: L knee Pain Descriptors / Indicators: Sore;Grimacing;Guarding;Moaning Pain Intervention(s): Monitored during session;Limited activity within patient's tolerance;Repositioned;Patient requesting pain meds-RN notified;Relaxation;Ice applied    Home Living                      Prior Function  PT Goals (current goals can now be found in the care plan section) Acute Rehab PT Goals Patient Stated Goal: decrease pain Progress towards PT goals: Progressing toward goals    Frequency    7X/week      PT Plan Current plan remains appropriate    Co-evaluation              AM-PAC PT "6 Clicks" Daily Activity  Outcome Measure  Difficulty turning over in bed (including adjusting bedclothes, sheets and  blankets)?: A Little Difficulty moving from lying on back to sitting on the side of the bed? : A Little Difficulty sitting down on and standing up from a chair with arms (e.g., wheelchair, bedside commode, etc,.)?: A Little Help needed moving to and from a bed to chair (including a wheelchair)?: A Little Help needed walking in hospital room?: A Little Help needed climbing 3-5 steps with a railing? : Total 6 Click Score: 16    End of Session Equipment Utilized During Treatment: Gait belt Activity Tolerance: Patient limited by pain Patient left: in bed;with call bell/phone within reach Nurse Communication: Mobility status;Patient requests pain meds PT Visit Diagnosis: Other abnormalities of gait and mobility (R26.89);Pain Pain - Right/Left: Left Pain - part of body: Knee     Time: 1335-1420 PT Time Calculation (min) (ACUTE ONLY): 45 min  Charges:  $Gait Training: 23-37 mins $Therapeutic Activity: 8-22 mins                     Colin Broach PT, DPT      [name] 02/12/2018, 2:26 PM

## 2018-02-12 NOTE — Anesthesia Postprocedure Evaluation (Signed)
Anesthesia Post Note  Patient: Melissa [name]  Procedure(s) Performed: TOTAL KNEE REVISION (Left )     Patient location during evaluation: PACU Anesthesia Type: General Level of consciousness: awake and alert Pain management: pain level controlled Vital Signs Assessment: post-procedure vital signs reviewed and stable Respiratory status: spontaneous breathing, nonlabored ventilation, respiratory function stable and patient connected to nasal cannula oxygen Cardiovascular status: blood pressure returned to baseline and stable Postop Assessment: no apparent nausea or vomiting Anesthetic complications: no    Last Vitals:  Vitals:   02/12/18 0547 02/12/18 1216  BP: 118/72 (!) 142/97  Pulse: 92 (!) 113  Resp: 16 18  Temp: 36.9 C 36.6 C  SpO2: 100% 100%    Last Pain:  Vitals:   02/12/18 1226  TempSrc:   PainSc: 9                  Mance Vallejo

## 2018-02-13 LAB — CBC
HCT: 26.3 % — ABNORMAL LOW (ref 36.0–46.0)
Hemoglobin: 8.8 g/dL — ABNORMAL LOW (ref 12.0–15.0)
MCH: 31.8 pg (ref 26.0–34.0)
MCHC: 33.5 g/dL (ref 30.0–36.0)
MCV: 94.9 fL (ref 78.0–100.0)
PLATELETS: 167 10*3/uL (ref 150–400)
RBC: 2.77 MIL/uL — ABNORMAL LOW (ref 3.87–5.11)
RDW: 11.4 % — AB (ref 11.5–15.5)
WBC: 8.2 10*3/uL (ref 4.0–10.5)

## 2018-02-13 NOTE — Progress Notes (Signed)
Physical Therapy Treatment Patient Details Name: Melissa [name] MRN: [id number] DOB: July 27, 1975 Today's Date: 02/13/2018    History of Present Illness Pt is a 42 y/o female s/p L total knee revision. PMH including but not limited to anemia and depression    PT Comments    Pt remains limited due to pain.  She performed increased gait trial and progression to stair and curb training to prepare for return home this am.  Pt also performed tub transfer as she expressed concerns for entering tub at home for showers.  Reviewed knee precautions and frequency of LE exercises.  Pt's ride is present at bed side and informed nursing she is ready for d/c home from a mobility stand point.     Follow Up Recommendations  Home health PT     Equipment Recommendations  Rolling walker with 5" wheels    Recommendations for Other Services       Precautions / Restrictions Precautions Precautions: Fall;Knee Precaution Booklet Issued: Yes (comment) Precaution Comments: reviewed precautions/positioning with pt Restrictions Weight Bearing Restrictions: Yes LLE Weight Bearing: Weight bearing as tolerated    Mobility  Bed Mobility Overal bed mobility: Needs Assistance Bed Mobility: Sit to Supine;Supine to Sit     Supine to sit: Supervision Sit to supine: Supervision   General bed mobility comments: Informed patient to use gt belt as leg lifter to ease pain.    Transfers Overall transfer level: Needs assistance Equipment used: Rolling walker (2 wheeled) Transfers: Sit to/from Stand Sit to Stand: Supervision         General transfer comment: for safety  Ambulation/Gait Ambulation/Gait assistance: Supervision Gait Distance (Feet): 150 Feet Assistive device: Rolling walker (2 wheeled) Gait Pattern/deviations: Step-to pattern;Decreased step length - left;Decreased step length - right;Decreased stance time - left;Decreased weight shift to left;Decreased dorsiflexion - left;Antalgic;Trunk  flexed Gait velocity: decreased   General Gait Details: pt with improved gait distance tolerance and improved sequencing. Continues to have decreased heel strike LLE but improved cadence. Unable to progress to step through pattern due to pain.     Stairs Stairs: Yes Stairs assistance: Min guard Stair Management: Two rails;No rails;Forwards;Step to pattern;With walker Number of Stairs: 4(+1 curb trial) General stair comments: Pt performed 2 stairs with B rails and min guard assistance with cues for sequencing.  Pt slow and guarded due to pain.  Pt performed curb negotiation with no rails and use of RW to simulate threshold entry.  Pt able to perform without difficulty.  Increased pain noted with both trials.     Wheelchair Mobility    Modified Rankin (Stroke Patients Only)       Balance Overall balance assessment: Needs assistance Sitting-balance support: Feet supported;No upper extremity supported Sitting balance-Leahy Scale: Good       Standing balance-Leahy Scale: Poor Standing balance comment: reliant on UE support in standing                            Cognition Arousal/Alertness: Awake/alert Behavior During Therapy: WFL for tasks assessed/performed Overall Cognitive Status: Within Functional Limits for tasks assessed                                        Exercises Total Joint Exercises Goniometric ROM: 60 degrees flexion in L knee.      General Comments  Pertinent Vitals/Pain Pain Assessment: 0-10 Pain Score: 8  Pain Location: L knee Pain Descriptors / Indicators: Sore;Grimacing;Guarding;Moaning Pain Intervention(s): Monitored during session;Repositioned    Home Living                      Prior Function            PT Goals (current goals can now be found in the care plan section) Acute Rehab PT Goals Patient Stated Goal: decrease pain Potential to Achieve Goals: Good Progress towards PT goals: Progressing  toward goals    Frequency    7X/week      PT Plan Current plan remains appropriate    Co-evaluation              AM-PAC PT "6 Clicks" Daily Activity  Outcome Measure  Difficulty turning over in bed (including adjusting bedclothes, sheets and blankets)?: None Difficulty moving from lying on back to sitting on the side of the bed? : A Little Difficulty sitting down on and standing up from a chair with arms (e.g., wheelchair, bedside commode, etc,.)?: A Little Help needed moving to and from a bed to chair (including a wheelchair)?: A Little Help needed walking in hospital room?: A Little Help needed climbing 3-5 steps with a railing? : A Little 6 Click Score: 19    End of Session Equipment Utilized During Treatment: Gait belt Activity Tolerance: Patient limited by pain Patient left: in bed;with call bell/phone within reach Nurse Communication: Mobility status;Patient requests pain meds PT Visit Diagnosis: Other abnormalities of gait and mobility (R26.89);Pain Pain - Right/Left: Left Pain - part of body: Knee     Time: [id number] PT Time Calculation (min) (ACUTE ONLY): 27 min  Charges:  $Gait Training: 8-22 mins $Therapeutic Activity: 8-22 mins                     Joycelyn RuaAimee Charnise Lovan, PTA pager [phone]    Florestine AversAimee J Penne Rosenstock 02/13/2018, 11:05 AM

## 2018-02-13 NOTE — Addendum Note (Signed)
Addendum  created 02/13/18 1322 by Bethena Midgetddono, Earon Rivest, MD   Intraprocedure Blocks edited, Sign clinical note

## 2018-02-13 NOTE — Progress Notes (Signed)
PATIENT ID: Melissa [name]  MRN: [id number]  DOB/AGE:  1975-07-28 / 42 y.o.  3 Days Post-Op Procedure(s) (LRB): TOTAL KNEE REVISION (Left)    PROGRESS NOTE Subjective: Patient is alert, oriented, no Nausea, no Vomiting, yes passing gas. Taking PO well. Denies SOB, Chest or Calf Pain. Using Incentive Spirometer, PAS in place. Ambulate 100', Patient reports pain as 4/10 .    Objective: Vital signs in last 24 hours: Vitals:   02/12/18 0547 02/12/18 1216 02/12/18 1927 02/13/18 0406  BP: 118/72 (Abnormal) 142/97 125/78 (Abnormal) 110/59  Pulse: 92 (Abnormal) 113 (Abnormal) 108 90  Resp: 16 18 16 16   Temp: 98.5 F (36.9 C) 97.9 F (36.6 C) 98.1 F (36.7 C) 99.1 F (37.3 C)  TempSrc: Oral Oral Oral Oral  SpO2: 100% 100% 100% 99%  Weight:      Height:          Intake/Output from previous day: I/O last 3 completed shifts: In: 720 [P.O.:720] Out: -    Intake/Output this shift: No intake/output data recorded.   LABORATORY DATA: Recent Labs    02/11/18 0404 02/12/18 0649 02/13/18 0319  WBC 10.7* 9.2 8.2  HGB 10.2* 9.3* 8.8*  HCT 31.4* 28.1* 26.3*  PLT 160 167 167  NA 133*  --   --   K 4.0  --   --   CL 106  --   --   CO2 18*  --   --   BUN 8  --   --   CREATININE 0.80  --   --   GLUCOSE 120*  --   --   CALCIUM 8.4*  --   --     Examination: Neurologically intact ABD soft Neurovascular intact Sensation intact distally Intact pulses distally Dorsiflexion/Plantar flexion intact Incision: scant drainage No cellulitis present Compartment soft}  Assessment:   3 Days Post-Op Procedure(s) (LRB): TOTAL KNEE REVISION (Left) ADDITIONAL DIAGNOSIS: Expected Acute Blood Loss Anemia, anxiety Anticipated LOS equal to or greater than 2 midnights due to - Age 865 and older with one or more of the following:  - Obesity  - Expected need for hospital services (PT, OT, Nursing) required for safe  discharge     Plan: PT/OT WBAT, AROM and PROM  DVT Prophylaxis:  SCDx72hrs, ASA  81 mg BID x 2 weeks DISCHARGE PLAN: Home, today after PT DISCHARGE NEEDS: HHPT, Walker and 3-in-1 comode seat     Nestor LewandowskyFrank J Yona Kosek 02/13/2018, 6:19 AM

## 2018-02-13 NOTE — Discharge Summary (Signed)
Patient ID: Melissa [name] MRN: [id number] DOB/AGE: May 29, 1976 42 y.o.  Admit date: 02/10/2018 Discharge date: 02/13/2018  Admission Diagnoses:  Principal Problem:   History of prosthetic unicompartmental arthroplasty of left knee Active Problems:   Primary osteoarthritis of left knee   Discharge Diagnoses:  Same  Past Medical History:  Diagnosis Date  . Anemia   . Asthma    as a young adult - not for many years  . Depression   . Headache    migraines  . Heart murmur    2004 - told that when she had her son - no follow ups   . Hidradenitis 2007  . History of kidney stones   . Seizures (HCC)    as a baby     Surgeries: Procedure(s): TOTAL KNEE REVISION on 02/10/2018   Consultants:   Discharged Condition: Improved  Hospital Course: Melissa [name] is an 42 y.o. female who was admitted 02/10/2018 for operative treatment ofHistory of prosthetic unicompartmental arthroplasty of left knee. Patient has severe unremitting pain that affects sleep, daily activities, and work/hobbies. After pre-op clearance the patient was taken to the operating room on 02/10/2018 and underwent  Procedure(s): TOTAL KNEE REVISION.    Patient was given perioperative antibiotics:  Anti-infectives (From admission, onward)   Start     Dose/Rate Route Frequency Ordered Stop   02/10/18 0800  ceFAZolin (ANCEF) IVPB 2g/100 mL premix     2 g 200 mL/hr over 30 Minutes Intravenous On call to O.R. 02/10/18 0750 02/10/18 1000   02/10/18 0757  ceFAZolin (ANCEF) 2-4 GM/100ML-% IVPB    Note to Pharmacy:  Sandi RavelingSchonewitz, Leigh   : cabinet override      02/10/18 0757 02/10/18 1000       Patient was given sequential compression devices, early ambulation, and chemoprophylaxis to prevent DVT.  Patient benefited maximally from hospital stay and there were no complications.    Recent vital signs:  Patient Vitals for the past 24 hrs:  BP Temp Temp src Pulse Resp SpO2  02/13/18 0406 (Abnormal) 110/59 99.1 F (37.3 C) Oral  90 16 99 %  02/12/18 1927 125/78 98.1 F (36.7 C) Oral (Abnormal) 108 16 100 %  02/12/18 1216 (Abnormal) 142/97 97.9 F (36.6 C) Oral (Abnormal) 113 18 100 %     Recent laboratory studies:  Recent Labs    02/11/18 0404 02/12/18 0649 02/13/18 0319  WBC 10.7* 9.2 8.2  HGB 10.2* 9.3* 8.8*  HCT 31.4* 28.1* 26.3*  PLT 160 167 167  NA 133*  --   --   K 4.0  --   --   CL 106  --   --   CO2 18*  --   --   BUN 8  --   --   CREATININE 0.80  --   --   GLUCOSE 120*  --   --   CALCIUM 8.4*  --   --      Discharge Medications:   Allergies as of 02/13/2018    Allergen Reactions Comment   Tylenol [acetaminophen] Itching, Other (See Comments) Throat itch and high fever (no reaction to hydrocodone with acetaminophen)   Celebrex [celecoxib] Nausea And Vomiting    Latex Rash Powder in gloves causes rash   Meloxicam Nausea And Vomiting    Other Rash Powder in gloves causes rash   Oxycodone Rash    Ultram [tramadol Hcl] Nausea And Vomiting Itching burning  (Can take Oxycodone)      Medication List  Stop taking these medications   cyclobenzaprine 5 MG tablet Commonly known as:  FLEXERIL   naproxen sodium 220 MG tablet Commonly known as:  ALEVE     Take these medications   apixaban 2.5 MG Tabs tablet Commonly known as:  ELIQUIS Take 1 tablet (2.5 mg total) by mouth 2 (two) times daily.   buPROPion 150 MG 12 hr tablet Commonly known as:  WELLBUTRIN SR Take 150 mg by mouth 2 (two) times daily.   doxycycline 100 MG capsule Commonly known as:  VIBRAMYCIN Take 100 mg by mouth 2 (two) times daily.   famotidine 20 MG tablet Commonly known as:  PEPCID Take 1 tablet (20 mg total) by mouth 2 (two) times daily.   HYDROmorphone 2 MG tablet Commonly known as:  DILAUDID Take 1 tablet (2 mg total) by mouth every 4 (four) hours as needed for severe pain.   tiZANidine 2 MG tablet Commonly known as:  ZANAFLEX Take 1 tablet (2 mg total) by mouth every 6 (six) hours as needed.    topiramate 50 MG tablet Commonly known as:  TOPAMAX Take 25 mg by mouth at bedtime as needed (headaches).        Durable Medical Equipment  (From admission, onward)         Start     Ordered   02/10/18 1501  DME Walker rolling  Once    Question:  Patient needs a walker to treat with the following condition  Answer:  Status post total left knee replacement   02/10/18 1500   02/10/18 1501  DME 3 n 1  Once     02/10/18 1500           Discharge Care Instructions  (From admission, onward)         Start     Ordered   02/13/18 0000  Change dressing    Comments:  Change dressing only if window has > 40% drainage   02/13/18 0624          Diagnostic Studies: No results found.  Disposition: Discharge disposition: 01-Home or Self Care       Discharge Instructions    Call MD / Call 911   Complete by:  As directed    If you experience chest pain or shortness of breath, CALL 911 and be transported to the hospital emergency room.  If you develope a fever above 101 F, pus (white drainage) or increased drainage or redness at the wound, or calf pain, call your surgeon's office.   Change dressing   Complete by:  As directed    Change dressing only if window has > 40% drainage   Constipation Prevention   Complete by:  As directed    Drink plenty of fluids.  Prune juice may be helpful.  You may use a stool softener, such as Colace (over the counter) 100 mg twice a day.  Use MiraLax (over the counter) for constipation as needed.   Diet - low sodium heart healthy   Complete by:  As directed    Increase activity slowly as tolerated   Complete by:  As directed       Follow-up Information    Gean Birchwoodowan, Alfreida Steffenhagen, MD In 2 weeks.   Specialty:  Orthopedic Surgery Contact information: [address] [place] [address] [phone]            Signed: Nestor LewandowskyFrank J Latoiya Maradiaga 02/13/2018, 6:24 AM

## 2018-02-13 NOTE — Care Management Important Message (Signed)
Important Message  Patient Details  Name: Melissa [name] MRN: [id number] Date of Birth: 26-Jun-1976   Medicare Important Message Given:  Yes    Elliot CousinShavis, Demetric Dunnaway Ellen, RN 02/13/2018, 11:57 AM

## 2018-02-13 NOTE — Progress Notes (Signed)
Pt given prescriptions and discharge instructions. Instructions gone over with her and father present. Answered all questions to satisfaction. All belongings gathered to be sent home.

## 2018-02-14 ENCOUNTER — Encounter (HOSPITAL_COMMUNITY): Payer: Self-pay | Admitting: Orthopedic Surgery

## 2018-02-14 DIAGNOSIS — Z96652 Presence of left artificial knee joint: Secondary | ICD-10-CM | POA: Diagnosis not present

## 2018-02-14 DIAGNOSIS — M6281 Muscle weakness (generalized): Secondary | ICD-10-CM | POA: Diagnosis not present

## 2018-02-14 DIAGNOSIS — R262 Difficulty in walking, not elsewhere classified: Secondary | ICD-10-CM | POA: Diagnosis not present

## 2018-02-14 DIAGNOSIS — Z471 Aftercare following joint replacement surgery: Secondary | ICD-10-CM | POA: Diagnosis not present

## 2018-02-14 DIAGNOSIS — Z7901 Long term (current) use of anticoagulants: Secondary | ICD-10-CM | POA: Diagnosis not present

## 2018-02-14 DIAGNOSIS — F329 Major depressive disorder, single episode, unspecified: Secondary | ICD-10-CM | POA: Diagnosis not present

## 2018-02-14 NOTE — Care Management Important Message (Signed)
Important Message  Patient Details  Name: Melissa [name] MRN: [id number] Date of Birth: 1976-06-13   Medicare Important Message Given:  Yes    Rica Heather Stefan ChurchBratton 02/14/2018, 8:07 AM

## 2018-02-16 DIAGNOSIS — M6281 Muscle weakness (generalized): Secondary | ICD-10-CM | POA: Diagnosis not present

## 2018-02-16 DIAGNOSIS — Z7901 Long term (current) use of anticoagulants: Secondary | ICD-10-CM | POA: Diagnosis not present

## 2018-02-16 DIAGNOSIS — Z96652 Presence of left artificial knee joint: Secondary | ICD-10-CM | POA: Diagnosis not present

## 2018-02-16 DIAGNOSIS — F329 Major depressive disorder, single episode, unspecified: Secondary | ICD-10-CM | POA: Diagnosis not present

## 2018-02-16 DIAGNOSIS — R262 Difficulty in walking, not elsewhere classified: Secondary | ICD-10-CM | POA: Diagnosis not present

## 2018-02-16 DIAGNOSIS — Z471 Aftercare following joint replacement surgery: Secondary | ICD-10-CM | POA: Diagnosis not present

## 2018-02-17 DIAGNOSIS — R262 Difficulty in walking, not elsewhere classified: Secondary | ICD-10-CM | POA: Diagnosis not present

## 2018-02-17 DIAGNOSIS — Z96652 Presence of left artificial knee joint: Secondary | ICD-10-CM | POA: Diagnosis not present

## 2018-02-17 DIAGNOSIS — Z7901 Long term (current) use of anticoagulants: Secondary | ICD-10-CM | POA: Diagnosis not present

## 2018-02-17 DIAGNOSIS — M6281 Muscle weakness (generalized): Secondary | ICD-10-CM | POA: Diagnosis not present

## 2018-02-17 DIAGNOSIS — F329 Major depressive disorder, single episode, unspecified: Secondary | ICD-10-CM | POA: Diagnosis not present

## 2018-02-17 DIAGNOSIS — Z471 Aftercare following joint replacement surgery: Secondary | ICD-10-CM | POA: Diagnosis not present

## 2018-02-21 DIAGNOSIS — R262 Difficulty in walking, not elsewhere classified: Secondary | ICD-10-CM | POA: Diagnosis not present

## 2018-02-21 DIAGNOSIS — Z96652 Presence of left artificial knee joint: Secondary | ICD-10-CM | POA: Diagnosis not present

## 2018-02-21 DIAGNOSIS — M6281 Muscle weakness (generalized): Secondary | ICD-10-CM | POA: Diagnosis not present

## 2018-02-21 DIAGNOSIS — Z7901 Long term (current) use of anticoagulants: Secondary | ICD-10-CM | POA: Diagnosis not present

## 2018-02-21 DIAGNOSIS — F329 Major depressive disorder, single episode, unspecified: Secondary | ICD-10-CM | POA: Diagnosis not present

## 2018-02-21 DIAGNOSIS — Z471 Aftercare following joint replacement surgery: Secondary | ICD-10-CM | POA: Diagnosis not present

## 2018-02-22 DIAGNOSIS — Z96652 Presence of left artificial knee joint: Secondary | ICD-10-CM | POA: Diagnosis not present

## 2018-02-22 DIAGNOSIS — Z471 Aftercare following joint replacement surgery: Secondary | ICD-10-CM | POA: Diagnosis not present

## 2018-02-22 DIAGNOSIS — M25562 Pain in left knee: Secondary | ICD-10-CM | POA: Diagnosis not present

## 2018-02-23 DIAGNOSIS — M6281 Muscle weakness (generalized): Secondary | ICD-10-CM | POA: Diagnosis not present

## 2018-02-23 DIAGNOSIS — Z96652 Presence of left artificial knee joint: Secondary | ICD-10-CM | POA: Diagnosis not present

## 2018-02-23 DIAGNOSIS — Z471 Aftercare following joint replacement surgery: Secondary | ICD-10-CM | POA: Diagnosis not present

## 2018-02-23 DIAGNOSIS — R262 Difficulty in walking, not elsewhere classified: Secondary | ICD-10-CM | POA: Diagnosis not present

## 2018-02-23 DIAGNOSIS — Z7901 Long term (current) use of anticoagulants: Secondary | ICD-10-CM | POA: Diagnosis not present

## 2018-02-23 DIAGNOSIS — F329 Major depressive disorder, single episode, unspecified: Secondary | ICD-10-CM | POA: Diagnosis not present

## 2018-02-24 DIAGNOSIS — M6281 Muscle weakness (generalized): Secondary | ICD-10-CM | POA: Diagnosis not present

## 2018-02-24 DIAGNOSIS — F329 Major depressive disorder, single episode, unspecified: Secondary | ICD-10-CM | POA: Diagnosis not present

## 2018-02-24 DIAGNOSIS — Z471 Aftercare following joint replacement surgery: Secondary | ICD-10-CM | POA: Diagnosis not present

## 2018-02-24 DIAGNOSIS — R262 Difficulty in walking, not elsewhere classified: Secondary | ICD-10-CM | POA: Diagnosis not present

## 2018-02-24 DIAGNOSIS — Z96652 Presence of left artificial knee joint: Secondary | ICD-10-CM | POA: Diagnosis not present

## 2018-02-24 DIAGNOSIS — Z7901 Long term (current) use of anticoagulants: Secondary | ICD-10-CM | POA: Diagnosis not present

## 2018-02-27 DIAGNOSIS — Z471 Aftercare following joint replacement surgery: Secondary | ICD-10-CM | POA: Diagnosis not present

## 2018-02-27 DIAGNOSIS — Z96652 Presence of left artificial knee joint: Secondary | ICD-10-CM | POA: Diagnosis not present

## 2018-02-27 DIAGNOSIS — R262 Difficulty in walking, not elsewhere classified: Secondary | ICD-10-CM | POA: Diagnosis not present

## 2018-02-27 DIAGNOSIS — Z7901 Long term (current) use of anticoagulants: Secondary | ICD-10-CM | POA: Diagnosis not present

## 2018-02-27 DIAGNOSIS — M6281 Muscle weakness (generalized): Secondary | ICD-10-CM | POA: Diagnosis not present

## 2018-02-27 DIAGNOSIS — F329 Major depressive disorder, single episode, unspecified: Secondary | ICD-10-CM | POA: Diagnosis not present

## 2018-03-01 DIAGNOSIS — Z96652 Presence of left artificial knee joint: Secondary | ICD-10-CM | POA: Diagnosis not present

## 2018-03-01 DIAGNOSIS — M6281 Muscle weakness (generalized): Secondary | ICD-10-CM | POA: Diagnosis not present

## 2018-03-01 DIAGNOSIS — Z7901 Long term (current) use of anticoagulants: Secondary | ICD-10-CM | POA: Diagnosis not present

## 2018-03-01 DIAGNOSIS — F329 Major depressive disorder, single episode, unspecified: Secondary | ICD-10-CM | POA: Diagnosis not present

## 2018-03-01 DIAGNOSIS — Z471 Aftercare following joint replacement surgery: Secondary | ICD-10-CM | POA: Diagnosis not present

## 2018-03-01 DIAGNOSIS — R262 Difficulty in walking, not elsewhere classified: Secondary | ICD-10-CM | POA: Diagnosis not present

## 2018-03-03 DIAGNOSIS — Z96652 Presence of left artificial knee joint: Secondary | ICD-10-CM | POA: Diagnosis not present

## 2018-03-03 DIAGNOSIS — M6281 Muscle weakness (generalized): Secondary | ICD-10-CM | POA: Diagnosis not present

## 2018-03-03 DIAGNOSIS — F329 Major depressive disorder, single episode, unspecified: Secondary | ICD-10-CM | POA: Diagnosis not present

## 2018-03-03 DIAGNOSIS — R262 Difficulty in walking, not elsewhere classified: Secondary | ICD-10-CM | POA: Diagnosis not present

## 2018-03-03 DIAGNOSIS — Z7901 Long term (current) use of anticoagulants: Secondary | ICD-10-CM | POA: Diagnosis not present

## 2018-03-03 DIAGNOSIS — Z471 Aftercare following joint replacement surgery: Secondary | ICD-10-CM | POA: Diagnosis not present

## 2018-03-09 ENCOUNTER — Other Ambulatory Visit: Payer: Medicare Other | Admitting: Obstetrics & Gynecology

## 2018-03-13 ENCOUNTER — Encounter: Payer: Self-pay | Admitting: Physical Therapy

## 2018-03-13 ENCOUNTER — Ambulatory Visit: Payer: Medicare Other | Attending: Orthopedic Surgery | Admitting: Physical Therapy

## 2018-03-13 ENCOUNTER — Other Ambulatory Visit: Payer: Self-pay

## 2018-03-13 DIAGNOSIS — G8929 Other chronic pain: Secondary | ICD-10-CM | POA: Diagnosis not present

## 2018-03-13 DIAGNOSIS — M25562 Pain in left knee: Secondary | ICD-10-CM | POA: Diagnosis not present

## 2018-03-13 DIAGNOSIS — M25662 Stiffness of left knee, not elsewhere classified: Secondary | ICD-10-CM | POA: Insufficient documentation

## 2018-03-13 DIAGNOSIS — R2689 Other abnormalities of gait and mobility: Secondary | ICD-10-CM | POA: Diagnosis not present

## 2018-03-13 DIAGNOSIS — R600 Localized edema: Secondary | ICD-10-CM | POA: Diagnosis not present

## 2018-03-13 NOTE — Therapy (Signed)
[facility] [address] [place], [place], [address] Phone: [phone]   Fax:  [phone]  Physical Therapy Evaluation  Patient Details  Name: Melissa [name] MRN: [id number] Date of Birth: 11-01-1975 Referring Provider: Gean Birchwood MD   Encounter Date: 03/13/2018  PT End of Session - 03/13/18 0808    Visit Number  1    Number of Visits  17    Date for PT Re-Evaluation  05/08/18    Authorization Type  MCR  Kx mod by 15th visit, progress note by 10th visit    PT Start Time  0801    PT Stop Time  0848    PT Time Calculation (min)  47 min    Activity Tolerance  Patient tolerated treatment well    Behavior During Therapy  [facility] for tasks assessed/performed       Past Medical History:  Diagnosis Date  . Anemia   . Asthma    as a young adult - not for many years  . Depression   . Headache    migraines  . Heart murmur    2004 - told that when she had her son - no follow ups   . Hidradenitis 2007  . History of kidney stones   . Seizures (HCC)    as a baby     Past Surgical History:  Procedure Laterality Date  . APPENDECTOMY  1997  . AXILLARY HIDRADENITIS EXCISION Bilateral 2007  . BACK SURGERY    . CESAREAN SECTION    . CHOLECYSTECTOMY    . INGUINAL HIDRADENITIS EXCISION  06-18-14   left  . KIDNEY STONE SURGERY    . KNEE ARTHROSCOPY Left 12/29/2015   Procedure: ARTHROSCOPY KNEE;  Surgeon: Deeann Saint, MD;  Location: [facility] ORS;  Service: Orthopedics;  Laterality: Left;  . KNEE SURGERY Left 1997  . LAPAROSCOPY N/A 09/02/2016   Procedure: LAPAROSCOPY OPERATIVE;  Surgeon: Christeen Douglas, MD;  Location: [facility] ORS;  Service: Gynecology;  Laterality: N/A;  . POSTERIOR LUMBAR FUSION N/A 03/25/2015   Procedure: POSTERIOR LUMBAR INTERBODY FUSION LUMBAR FOUR-FIVE;  Surgeon: Julio Sicks, MD;  Location: [facility] OR;  Service: Neurosurgery;  Laterality: N/A;  . TOTAL KNEE REVISION Left 02/10/2018   Procedure: TOTAL KNEE REVISION;  Surgeon:  Gean Birchwood, MD;  Location: [name] OR;  Service: Orthopedics;  Laterality: Left;  . TUBAL LIGATION      There were no vitals filed for this visit.   Subjective Assessment - 03/13/18 0807    Subjective  pt is a 42 y.o F s/p L TKA revision on 02/10/2018. since the surgery she reports continued pain and swelling with pins and needles in the knee especially with weight bearing. Pain stays mostly in the knee, and will radiate down to shin. Pain is always in the leg and in the back of the knee. pt reports the knee is always numb but she is able to feel pressure. pt reports having HHPT for about 6 or 7 visits.    Pertinent History  hx or seizures    Limitations  Lifting;Standing;Walking    How long can you sit comfortably?  30 min     How long can you stand comfortably?  2 min     How long can you walk comfortably?  10 min    Diagnostic tests  x-ray    Patient Stated Goals  staighten the knee out, decrease pain     Currently in Pain?  Yes    Pain Score  10-Worst pain ever   stays 10/10   Pain Location  Knee    Pain Orientation  Left    Pain Descriptors / Indicators  Pins and needles;Sharp;Aching    Pain Type  Chronic pain    Pain Onset  More than a month ago    Pain Frequency  Constant    Aggravating Factors   standing/ walking, bending the knee,     Pain Relieving Factors  laying down    Effect of Pain on Daily Activities  limited walking/ standing pain         [facility] PT Assessment - 03/13/18 0809      Assessment   Medical Diagnosis  L TKA revision    Referring Provider  Gean Birchwood MD    Onset Date/Surgical Date  02/10/18    Hand Dominance  Right    Next MD Visit  03/15/2018    Prior Therapy  yes      Precautions   Precautions  None      Restrictions   Weight Bearing Restrictions  No      Balance Screen   Has the patient fallen in the past 6 months  Yes    How many times?  4    Has the patient had a decrease in activity level because of a fear of falling?   No    Is the  patient reluctant to leave their home because of a fear of falling?   No      Home Environment   Living Environment  Private residence    Living Arrangements  Spouse/significant other    Available Help at Discharge  Family;Available PRN/intermittently    Type of Home  House    Home Access  Stairs to enter    Entrance Stairs-Number of Steps  4    Entrance Stairs-Rails  Can reach both    Home Layout  One level    Home Equipment  Crutches;Walker - 2 wheels      Prior Function   Level of Independence  Independent with basic ADLs    Vocation  On disability    Leisure  horseback riding       Cognition   Overall Cognitive Status  Within Functional Limits for tasks assessed      Observation/Other Assessments   Focus on Therapeutic Outcomes (FOTO)   74% limited   predicted 54% limited     Observation/Other Assessments-Edema    Edema  Circumferential      Circumferential Edema   Circumferential - Left   joint line 42cm, 10 cm below 39cm, 10 cm above 49.5cm      Posture/Postural Control   Posture/Postural Control  Postural limitations    Postural Limitations  Rounded Shoulders;Forward head      ROM / Strength   AROM / PROM / Strength  AROM;PROM;Strength      AROM   AROM Assessment Site  Knee    Right/Left Knee  Left;Right    Right Knee Extension  0    Right Knee Flexion  128    Left Knee Extension  17    Left Knee Flexion  93      PROM   PROM Assessment Site  Knee    Right/Left Knee  Left    Left Knee Extension  14    Left Knee Flexion  102      Strength   Strength Assessment Site  Knee    Right/Left Knee  Left;Right  Right Knee Flexion  5/5    Right Knee Extension  5/5    Left Knee Flexion  3/5   pain during testing   Left Knee Extension  3/5   pain during testing     Palpation   Palpation comment  TTP along the biceps femoris and along proximal tibia/ distal femur      Ambulation/Gait   Assistive device  None    Gait Pattern   Antalgic;Trendelenburg;Decreased stance time - left;Decreased step length - left;Step-through pattern;Decreased trunk rotation;Wide base of support                Objective measurements completed on examination: See above findings.              PT Education - 03/13/18 0807    Education Details  evaluation findings, POC, goals, HEP with proper form/ rationale    Person(s) Educated  Patient    Methods  Explanation;Verbal cues;Handout    Comprehension  Verbalized understanding;Verbal cues required       PT Short Term Goals - 03/13/18 0858      PT SHORT TERM GOAL #1   Title  pt to be I with inital HEP    Time  4    Period  Weeks    Status  New    Target Date  04/10/18      PT SHORT TERM GOAL #2   Title  pt to verbalize and demo techniques to reduce pain and inflammation via RICE and HEP     Time  4    Period  Weeks    Status  New    Target Date  04/10/18      PT SHORT TERM GOAL #3   Title  pt to increase increase knee rom from </= 10 degrees to >/= 100 degreeswith </= 8/10  to promote functional mobility     Time  4    Period  Weeks    Status  New    Target Date  04/10/18      PT SHORT TERM GOAL #4   Title  pt to demonstrate overall reduction of >/= 2 cm in edema to promote knee mobility and reduce pain     Time  4    Period  Weeks    Status  New    Target Date  04/10/18        PT Long Term Goals - 03/13/18 0904      PT LONG TERM GOAL #1   Title  pt to increase knee ROM to </= 2 to >/=120 degrees to promote functional and efficent gait pattern with </= 2/10 pain     Time  8    Period  Weeks    Status  New    Target Date  05/08/18      PT LONG TERM GOAL #2   Title  pt to increase L knee strength to >/= 4+/5 to promote knee stability with walking/ standing activities to maximze balance and safety    Time  8    Period  Weeks    Status  New    Target Date  05/08/18      PT LONG TERM GOAL #3   Title  pt to be abel to stand and walk for >/= 30  min with LRAD to promote in home and community ambulation with </=2/10 pain     Time  8    Period  Weeks    Status  New  Target Date  05/08/18      PT LONG TERM GOAL #4   Title  pt to increase FOTO score to </= 54% limited to demo improvement in function     Time  8    Period  Weeks    Status  New    Target Date  05/08/18      PT LONG TERM GOAL #5   Title  pt to be I with all HEP given as of last visit to maintain and progress current level of function     Time  8    Period  Weeks    Status  New    Target Date  05/08/18             Plan - 03/13/18 0854    Clinical Impression Statement  pt presents to OPPT s/p L TKA revision on 02/10/2018. She demonstrates limited knee mobility and strength secondary to knee pain and edema. She exhibits an antalgic gait pattern with limited stance time on the L or step length bil with no AD. TTP noted specifcally at the biceps femoris on the L. she would benefit from physical therapy to decrease knee pain, promote mobility, promote efficient gait pattern and overall function by addressing the deficits listed.     History and Personal Factors relevant to plan of care:  hx or seizures,     Clinical Presentation  Evolving    Clinical Presentation due to:  mutliple knee surgeries, limited knee mobility, strength, abnormal gait pattern     Clinical Decision Making  Moderate    Rehab Potential  Good    PT Frequency  2x / week    PT Duration  8 weeks    PT Treatment/Interventions  ADLs/Self Care Home Management;Cryotherapy;Electrical Stimulation;Iontophoresis 4mg /ml Dexamethasone;Moist Heat;Traction;Balance training;Neuromuscular re-education;Gait training;Therapeutic activities;Therapeutic exercise;Manual techniques;Functional mobility training;Vasopneumatic Device;Taping;Passive range of motion;Patient/family education;Ultrasound    PT Next Visit Plan  review/ update HEP, knee ROM/ stretching, knee/ hip activation, ankle strenght, vaso PRN    PT Home  Exercise Plan  quad set, SLR with quad set, hamstring stretch, standing heel raise    Consulted and Agree with Plan of Care  Patient       Patient will benefit from skilled therapeutic intervention in order to improve the following deficits and impairments:  Abnormal gait, Pain, Increased fascial restricitons, Decreased strength, Increased edema, Decreased activity tolerance, Decreased mobility, Decreased endurance, Decreased balance, Decreased range of motion, Impaired flexibility  Visit Diagnosis: Chronic pain of left knee  Stiffness of left knee, not elsewhere classified  Localized edema  Other abnormalities of gait and mobility     Problem List Patient Active Problem List   Diagnosis Date Noted  . Primary osteoarthritis of left knee 02/10/2018  . History of prosthetic unicompartmental arthroplasty of left knee 02/07/2018  . Acute pelvic pain, female 09/02/2016  . DDD (degenerative disc disease), lumbar 03/25/2015   Lulu Riding PT, DPT, LAT, ATC  03/13/18  9:11 AM      [facility] [address] [place], [place], [address] Phone: [phone]   Fax:  [phone]  Name: Melissa [name] MRN: [id number] Date of Birth: [birth year]

## 2018-03-15 DIAGNOSIS — M25562 Pain in left knee: Secondary | ICD-10-CM | POA: Diagnosis not present

## 2018-03-15 DIAGNOSIS — Z471 Aftercare following joint replacement surgery: Secondary | ICD-10-CM | POA: Diagnosis not present

## 2018-03-15 DIAGNOSIS — Z96652 Presence of left artificial knee joint: Secondary | ICD-10-CM | POA: Diagnosis not present

## 2018-03-16 ENCOUNTER — Telehealth: Payer: Self-pay | Admitting: Physical Therapy

## 2018-03-16 ENCOUNTER — Encounter: Payer: Medicare Other | Admitting: Physical Therapy

## 2018-03-16 NOTE — Telephone Encounter (Signed)
Spoke to patient regarding no-show to her appointment this morning, She reports over sleeping and being ill when she awoke. She plans to attend her next appointment.

## 2018-03-21 ENCOUNTER — Ambulatory Visit: Payer: Medicare Other

## 2018-03-21 DIAGNOSIS — R600 Localized edema: Secondary | ICD-10-CM | POA: Diagnosis not present

## 2018-03-21 DIAGNOSIS — M25662 Stiffness of left knee, not elsewhere classified: Secondary | ICD-10-CM | POA: Diagnosis not present

## 2018-03-21 DIAGNOSIS — G8929 Other chronic pain: Secondary | ICD-10-CM | POA: Diagnosis not present

## 2018-03-21 DIAGNOSIS — R2689 Other abnormalities of gait and mobility: Secondary | ICD-10-CM

## 2018-03-21 DIAGNOSIS — M25562 Pain in left knee: Secondary | ICD-10-CM | POA: Diagnosis not present

## 2018-03-21 NOTE — Therapy (Signed)
[facility] [address] [place], [place], [address] Phone: [phone]   Fax:  [phone]  Physical Therapy Treatment  Patient Details  Name: Melissa [name] MRN: [id number] Date of Birth: April 28, 1976 Referring Provider: Gean Birchwood MD   Encounter Date: 03/21/2018  PT End of Session - 03/21/18 1459    Visit Number  2    Number of Visits  17    Date for PT Re-Evaluation  05/08/18    Authorization Type  MCR  Kx mod by 15th visit, progress note by 10th visit    PT Start Time  0300    PT Stop Time  0355    PT Time Calculation (min)  55 min    Activity Tolerance  Patient tolerated treatment well    Behavior During Therapy  [facility] for tasks assessed/performed       Past Medical History:  Diagnosis Date  . Anemia   . Asthma    as a young adult - not for many years  . Depression   . Headache    migraines  . Heart murmur    2004 - told that when she had her son - no follow ups   . Hidradenitis 2007  . History of kidney stones   . Seizures (HCC)    as a baby     Past Surgical History:  Procedure Laterality Date  . APPENDECTOMY  1997  . AXILLARY HIDRADENITIS EXCISION Bilateral 2007  . BACK SURGERY    . CESAREAN SECTION    . CHOLECYSTECTOMY    . INGUINAL HIDRADENITIS EXCISION  06-18-14   left  . KIDNEY STONE SURGERY    . KNEE ARTHROSCOPY Left 12/29/2015   Procedure: ARTHROSCOPY KNEE;  Surgeon: Deeann Saint, MD;  Location: [facility] ORS;  Service: Orthopedics;  Laterality: Left;  . KNEE SURGERY Left 1997  . LAPAROSCOPY N/A 09/02/2016   Procedure: LAPAROSCOPY OPERATIVE;  Surgeon: Christeen Douglas, MD;  Location: [facility] ORS;  Service: Gynecology;  Laterality: N/A;  . POSTERIOR LUMBAR FUSION N/A 03/25/2015   Procedure: POSTERIOR LUMBAR INTERBODY FUSION LUMBAR FOUR-FIVE;  Surgeon: Julio Sicks, MD;  Location: [facility] OR;  Service: Neurosurgery;  Laterality: N/A;  . TOTAL KNEE REVISION Left 02/10/2018   Procedure: TOTAL KNEE REVISION;  Surgeon:  Gean Birchwood, MD;  Location: [facility] OR;  Service: Orthopedics;  Laterality: Left;  . TUBAL LIGATION      There were no vitals filed for this visit.  Subjective Assessment - 03/21/18 1505    Subjective  She report LT TKA.  revision.    No pain medication in past 2 weeks .      Pain Score  10-Worst pain ever    Pain Location  Knee    Pain Orientation  Left    Pain Descriptors / Indicators  Aching;Sharp    Pain Type  Chronic pain    Pain Onset  More than a month ago    Pain Frequency  Constant    Aggravating Factors   stand /walk / bend    Pain Relieving Factors  rest                       [facility] Adult PT Treatment/Exercise - 03/21/18 0001      Self-Care   Self-Care  Other Self-Care Comments    Other Self-Care Comments   Spent last 15 min of session working on breathing and decr tension generally and eventually she was able to do this. Discussed that  results of this would not be immediate.  agreed with her pool may be of benefit. no bike now.   Also to do the exercises with RT leg  with awreness so LT leg can understand normal movement      Exercises   Exercises  Knee/Hip      Knee/Hip Exercises: Stretches   Other Knee/Hip Stretches  heel slides in sitting x 8       Knee/Hip Exercises: Seated   Other Seated Knee/Hip Exercises  QS x 10 with knee extended ankle  DF      Manual Therapy   Manual Therapy  Taping    Kinesiotex  Create Space      Kinesiotix   Create Space  2 peices RT and left of patella with 50% tension . She had lotioned earlier in day so sticking may not occur over time               PT Short Term Goals - 03/13/18 0858      PT SHORT TERM GOAL #1   Title  pt to be I with inital HEP    Time  4    Period  Weeks    Status  New    Target Date  04/10/18      PT SHORT TERM GOAL #2   Title  pt to verbalize and demo techniques to reduce pain and inflammation via RICE and HEP     Time  4    Period  Weeks    Status  New    Target Date   04/10/18      PT SHORT TERM GOAL #3   Title  pt to increase increase knee rom from </= 10 degrees to >/= 100 degreeswith </= 8/10  to promote functional mobility     Time  4    Period  Weeks    Status  New    Target Date  04/10/18      PT SHORT TERM GOAL #4   Title  pt to demonstrate overall reduction of >/= 2 cm in edema to promote knee mobility and reduce pain     Time  4    Period  Weeks    Status  New    Target Date  04/10/18        PT Long Term Goals - 03/13/18 0904      PT LONG TERM GOAL #1   Title  pt to increase knee ROM to </= 2 to >/=120 degrees to promote functional and efficent gait pattern with </= 2/10 pain     Time  8    Period  Weeks    Status  New    Target Date  05/08/18      PT LONG TERM GOAL #2   Title  pt to increase L knee strength to >/= 4+/5 to promote knee stability with walking/ standing activities to maximze balance and safety    Time  8    Period  Weeks    Status  New    Target Date  05/08/18      PT LONG TERM GOAL #3   Title  pt to be abel to stand and walk for >/= 30 min with LRAD to promote in home and community ambulation with </=2/10 pain     Time  8    Period  Weeks    Status  New    Target Date  05/08/18      PT LONG  TERM GOAL #4   Title  pt to increase FOTO score to </= 54% limited to demo improvement in function     Time  8    Period  Weeks    Status  New    Target Date  05/08/18      PT LONG TERM GOAL #5   Title  pt to be I with all HEP given as of last visit to maintain and progress current level of function     Time  8    Period  Weeks    Status  New    Target Date  05/08/18            Plan - 03/21/18 1500    Clinical Impression Statement  No modalities as she reports no benefit. We worked on calming overall to decr negative inputs so maybe pain cna decr.   She did well at end. Daughter encouraged this. Asked her to not push through pain for now and work on calm.     PT Treatment/Interventions  ADLs/Self Care  Home Management;Cryotherapy;Electrical Stimulation;Iontophoresis 4mg /ml Dexamethasone;Moist Heat;Traction;Balance training;Neuromuscular re-education;Gait training;Therapeutic activities;Therapeutic exercise;Manual techniques;Functional mobility training;Vasopneumatic Device;Taping;Passive range of motion;Patient/family education;Ultrasound    PT Next Visit Plan  review/ update HEP, knee ROM/ stretching, knee/ hip activation, ankle strenght, vaso PRN    PT Home Exercise Plan  quad set, SLR with quad set, hamstring stretch, standing heel raise    Consulted and Agree with Plan of Care  Patient       Patient will benefit from skilled therapeutic intervention in order to improve the following deficits and impairments:  Abnormal gait, Pain, Increased fascial restricitons, Decreased strength, Increased edema, Decreased activity tolerance, Decreased mobility, Decreased endurance, Decreased balance, Decreased range of motion, Impaired flexibility  Visit Diagnosis: Chronic pain of left knee  Stiffness of left knee, not elsewhere classified  Localized edema  Other abnormalities of gait and mobility     Problem List Patient Active Problem List   Diagnosis Date Noted  . Primary osteoarthritis of left knee 02/10/2018  . History of prosthetic unicompartmental arthroplasty of left knee 02/07/2018  . Acute pelvic pain, female 09/02/2016  . DDD (degenerative disc disease), lumbar 03/25/2015    Caprice RedChasse, Jona Zappone M  PT 03/21/2018, 3:47 PM  [facility] [address] [place], [place], [address] Phone: [phone]   Fax:  [phone]  Name: Melissa [name] MRN: [id number] Date of Birth: [birth year]

## 2018-03-23 ENCOUNTER — Encounter: Payer: Self-pay | Admitting: Physical Therapy

## 2018-03-23 ENCOUNTER — Ambulatory Visit: Payer: Medicare Other | Admitting: Physical Therapy

## 2018-03-23 DIAGNOSIS — M25562 Pain in left knee: Principal | ICD-10-CM

## 2018-03-23 DIAGNOSIS — R600 Localized edema: Secondary | ICD-10-CM | POA: Diagnosis not present

## 2018-03-23 DIAGNOSIS — G8929 Other chronic pain: Secondary | ICD-10-CM

## 2018-03-23 DIAGNOSIS — M25662 Stiffness of left knee, not elsewhere classified: Secondary | ICD-10-CM | POA: Diagnosis not present

## 2018-03-23 DIAGNOSIS — R2689 Other abnormalities of gait and mobility: Secondary | ICD-10-CM

## 2018-03-23 NOTE — Therapy (Signed)
[facility] [address] [place], [place], [address] Phone: [phone]   Fax:  [phone]  Physical Therapy Treatment  Patient Details  Name: Henrietta Dineheresa A Rhinehart MRN: [id number] Date of Birth: Jun 30, 1976 Referring Provider: Gean BirchwoodFrank Rowan MD   Encounter Date: 03/23/2018  PT End of Session - 03/23/18 0944    Visit Number  3    Number of Visits  17    Date for PT Re-Evaluation  05/08/18    Authorization Type  MCR  Kx mod by 15th visit, progress note by 10th visit    PT Start Time  0931    PT Stop Time  1016    PT Time Calculation (min)  45 min    Activity Tolerance  Patient tolerated treatment well    Behavior During Therapy  [facility] for tasks assessed/performed       Past Medical History:  Diagnosis Date  . Anemia   . Asthma    as a young adult - not for many years  . Depression   . Headache    migraines  . Heart murmur    2004 - told that when she had her son - no follow ups   . Hidradenitis 2007  . History of kidney stones   . Seizures (HCC)    as a baby     Past Surgical History:  Procedure Laterality Date  . APPENDECTOMY  1997  . AXILLARY HIDRADENITIS EXCISION Bilateral 2007  . BACK SURGERY    . CESAREAN SECTION    . CHOLECYSTECTOMY    . INGUINAL HIDRADENITIS EXCISION  06-18-14   left  . KIDNEY STONE SURGERY    . KNEE ARTHROSCOPY Left 12/29/2015   Procedure: ARTHROSCOPY KNEE;  Surgeon: Deeann SaintHoward Miller, MD;  Location: [facility] ORS;  Service: Orthopedics;  Laterality: Left;  . KNEE SURGERY Left 1997  . LAPAROSCOPY N/A 09/02/2016   Procedure: LAPAROSCOPY OPERATIVE;  Surgeon: Christeen DouglasBethany Beasley, MD;  Location: [facility];  Service: Gynecology;  Laterality: N/A;  . POSTERIOR LUMBAR FUSION N/A 03/25/2015   Procedure: POSTERIOR LUMBAR INTERBODY FUSION LUMBAR FOUR-FIVE;  Surgeon: Julio SicksHenry Pool, MD;  [facility];  Service: Neurosurgery;  Laterality: N/A;  . TOTAL KNEE REVISION Left 02/10/2018   Procedure: TOTAL KNEE REVISION;  Surgeon:  Gean Birchwoodowan, Frank, MD;  [facility];  Service: Orthopedics;  Laterality: Left;  . TUBAL LIGATION      There were no vitals filed for this visit.  Subjective Assessment - 03/23/18 0939    Subjective  "I am still having pain at 8/10, I am more swelling in the there despite stilling my exercises and walking'"    Currently in Pain?  Yes    Pain Score  8     Pain Location  Knee    Pain Orientation  Left    Pain Descriptors / Indicators  Aching;Sore    Pain Type  Chronic pain    Pain Onset  More than a month ago    Pain Frequency  Constant         [facility] PT Assessment - 03/23/18 1015      AROM   Left Knee Flexion  104                   [facility] Adult PT Treatment/Exercise - 03/23/18 0001      Exercises   Exercises  Knee/Hip      Knee/Hip Exercises: Stretches   Active Hamstring Stretch  Left;2 reps;30 seconds  Knee/Hip Exercises: Supine   Heel Slides  2 sets;10 reps;Left      Modalities   Modalities  Electrical Stimulation;Moist Heat      Moist Heat Therapy   Number Minutes Moist Heat  10 Minutes    Moist Heat Location  Knee      Electrical Stimulation   Electrical Stimulation Location  L knee    Electrical Stimulation Action  IFC    Electrical Stimulation Parameters  L 10, 100% scan, x 10 min     Electrical Stimulation Goals  Pain      Manual Therapy   Manual Therapy  Other (comment)    Manual therapy comments  retro/ antegrage massage for swelling with leg elevated   benefits of performing at home and how   Other Manual Therapy  scar tissue cross friction massage   how to perform at home            PT Education - 03/23/18 1110    Education Details  reviewed pain education with specific focus that we believe she is having pain, and that it isn't all in her head; but the brain is the processing center that says what stimulus is if its pain, temperature, touch...etc.  benefits of doing cross fricton and antegrad/ retro grade massage for edeam.      Person(s) Educated  Patient    Methods  Explanation;Verbal cues    Comprehension  Verbalized understanding;Verbal cues required       PT Short Term Goals - 03/13/18 0858      PT SHORT TERM GOAL #1   Title  pt to be I with inital HEP    Time  4    Period  Weeks    Status  New    Target Date  04/10/18      PT SHORT TERM GOAL #2   Title  pt to verbalize and demo techniques to reduce pain and inflammation via RICE and HEP     Time  4    Period  Weeks    Status  New    Target Date  04/10/18      PT SHORT TERM GOAL #3   Title  pt to increase increase knee rom from </= 10 degrees to >/= 100 degreeswith </= 8/10  to promote functional mobility     Time  4    Period  Weeks    Status  New    Target Date  04/10/18      PT SHORT TERM GOAL #4   Title  pt to demonstrate overall reduction of >/= 2 cm in edema to promote knee mobility and reduce pain     Time  4    Period  Weeks    Status  New    Target Date  04/10/18        PT Long Term Goals - 03/13/18 0904      PT LONG TERM GOAL #1   Title  pt to increase knee ROM to </= 2 to >/=120 degrees to promote functional and efficent gait pattern with </= 2/10 pain     Time  8    Period  Weeks    Status  New    Target Date  05/08/18      PT LONG TERM GOAL #2   Title  pt to increase L knee strength to >/= 4+/5 to promote knee stability with walking/ standing activities to maximze balance and safety    Time  8  Period  Weeks    Status  New    Target Date  05/08/18      PT LONG TERM GOAL #3   Title  pt to be abel to stand and walk for >/= 30 min with LRAD to promote in home and community ambulation with </=2/10 pain     Time  8    Period  Weeks    Status  New    Target Date  05/08/18      PT LONG TERM GOAL #4   Title  pt to increase FOTO score to </= 54% limited to demo improvement in function     Time  8    Period  Weeks    Status  New    Target Date  05/08/18      PT LONG TERM GOAL #5   Title  pt to be I with all HEP  given as of last visit to maintain and progress current level of function     Time  8    Period  Weeks    Status  New    Target Date  05/08/18            Plan - 03/23/18 1112    Clinical Impression Statement  Mrs. Dinkel continues to report 8/10 pain, with fluctuating swelling in the knee. she reports consistency with HEP. she noted feeling flustered due to previous session thinking pain was only in her head, re-educated pain education which pt stated improvement in understand. focuse on pain relief techniques via E-stim combined with MHP, and cross friction massage, and edema reduction techniques. She demonstrates improvement in flexion at 104 degrees today, and reported decreased pain end of session at 5/10.     PT Treatment/Interventions  ADLs/Self Care Home Management;Cryotherapy;Electrical Stimulation;Iontophoresis 4mg /ml Dexamethasone;Moist Heat;Traction;Balance training;Neuromuscular re-education;Gait training;Therapeutic activities;Therapeutic exercise;Manual techniques;Functional mobility training;Vasopneumatic Device;Taping;Passive range of motion;Patient/family education;Ultrasound    PT Next Visit Plan  review/ update HEP, knee ROM/ stretching, knee/ hip activation, ankle strenght, vaso PRN    PT Home Exercise Plan  quad set, SLR with quad set, hamstring stretch, standing heel raise, cross friction massage, antegrade/ retrograde massage    Consulted and Agree with Plan of Care  Patient       Patient will benefit from skilled therapeutic intervention in order to improve the following deficits and impairments:  Abnormal gait, Pain, Increased fascial restricitons, Decreased strength, Increased edema, Decreased activity tolerance, Decreased mobility, Decreased endurance, Decreased balance, Decreased range of motion, Impaired flexibility  Visit Diagnosis: Chronic pain of left knee  Stiffness of left knee, not elsewhere classified  Localized edema  Other abnormalities of gait and  mobility     Problem List Patient Active Problem List   Diagnosis Date Noted  . Primary osteoarthritis of left knee 02/10/2018  . History of prosthetic unicompartmental arthroplasty of left knee 02/07/2018  . Acute pelvic pain, female 09/02/2016  . DDD (degenerative disc disease), lumbar 03/25/2015   Lulu Riding PT, DPT, LAT, ATC  03/23/18  11:16 AM      [facility] [address] [place], [place], [address] Phone: [phone]   Fax:  [phone]  Name: HARUMI YAMIN MRN: [id number] Date of Birth: May 05, 1976

## 2018-03-28 ENCOUNTER — Ambulatory Visit: Payer: Medicare Other | Admitting: Physical Therapy

## 2018-03-30 ENCOUNTER — Ambulatory Visit: Payer: Medicare Other | Admitting: Physical Therapy

## 2018-04-03 ENCOUNTER — Encounter: Payer: Self-pay | Admitting: Physical Therapy

## 2018-04-03 ENCOUNTER — Ambulatory Visit: Payer: Medicare Other | Admitting: Physical Therapy

## 2018-04-03 DIAGNOSIS — M25562 Pain in left knee: Principal | ICD-10-CM

## 2018-04-03 DIAGNOSIS — R600 Localized edema: Secondary | ICD-10-CM | POA: Diagnosis not present

## 2018-04-03 DIAGNOSIS — M25662 Stiffness of left knee, not elsewhere classified: Secondary | ICD-10-CM

## 2018-04-03 DIAGNOSIS — R2689 Other abnormalities of gait and mobility: Secondary | ICD-10-CM

## 2018-04-03 DIAGNOSIS — G8929 Other chronic pain: Secondary | ICD-10-CM | POA: Diagnosis not present

## 2018-04-03 NOTE — Therapy (Addendum)
[place], [place], [address] Phone: [phone]   Fax:  [phone]  Physical Therapy Treatment / Discharge summary  Patient Details  Name: Melissa [name] MRN: [id number] Date of Birth: Jul 11, 1975 Referring Provider (PT): Frederik Pear MD   Encounter Date: 04/03/2018  PT End of Session - 04/03/18 1146    Visit Number  4    Number of Visits  17    Date for PT Re-Evaluation  05/08/18    Authorization Type  MCR  Kx mod by 15th visit, progress note by 10th visit    PT Start Time  1146    PT Stop Time  1233    PT Time Calculation (min)  47 min    Activity Tolerance  Patient tolerated treatment well    Behavior During Therapy  [facility] for tasks assessed/performed       Past Medical History:  Diagnosis Date  . Anemia   . Asthma    as a young adult - not for many years  . Depression   . Headache    migraines  . Heart murmur    2004 - told that when she had her son - no follow ups   . Hidradenitis 2007  . History of kidney stones   . Seizures ([place])    as a baby     Past Surgical History:  Procedure Laterality Date  . APPENDECTOMY  1997  . AXILLARY HIDRADENITIS EXCISION Bilateral 2007  . BACK SURGERY    . CESAREAN SECTION    . CHOLECYSTECTOMY    . INGUINAL HIDRADENITIS EXCISION  06-18-14   left  . KIDNEY STONE SURGERY    . KNEE ARTHROSCOPY Left 12/29/2015   Procedure: ARTHROSCOPY KNEE;  Surgeon: Earnestine Leys, MD;  Location: [facility] ORS;  Service: Orthopedics;  Laterality: Left;  . KNEE SURGERY Left 1997  . LAPAROSCOPY N/A 09/02/2016   Procedure: LAPAROSCOPY OPERATIVE;  Surgeon: Benjaman Kindler, MD;  Location: [facility] ORS;  Service: Gynecology;  Laterality: N/A;  . POSTERIOR LUMBAR FUSION N/A 03/25/2015   Procedure: POSTERIOR LUMBAR INTERBODY FUSION LUMBAR FOUR-FIVE;  Surgeon: Earnie Larsson, MD;  Location: [place];  Service: Neurosurgery;  Laterality: N/A;  . TOTAL KNEE REVISION Left 02/10/2018   Procedure: TOTAL  KNEE REVISION;  Surgeon: Frederik Pear, MD;  Location: [place];  Service: Orthopedics;  Laterality: Left;  . TUBAL LIGATION      There were no vitals filed for this visit.  Subjective Assessment - 04/03/18 1147    Subjective  "I am still having soreness in the knee just under my knee cap and it is popping"    Currently in Pain?  Yes    Pain Score  4     Pain Location  Knee    Pain Orientation  Left    Pain Descriptors / Indicators  Sore    Pain Type  Chronic pain    Pain Onset  More than a month ago    Pain Frequency  Constant    Aggravating Factors   stnading/ walking/ bending    Pain Relieving Factors  rest         [facility] PT Assessment - 04/03/18 1203      AROM   Left Knee Flexion  106                   [facility] Adult PT Treatment/Exercise - 04/03/18 1221      Knee/Hip Exercises: Stretches   Sports administrator  3 reps;30 seconds      Knee/Hip Exercises: Aerobic   Stationary Bike  x 6 min started full revolutions backward x 2 min then forward 4 min       Knee/Hip Exercises: Standing   Gait Training  4 x 30 ft focusing on exaggerated heel strike/ toe off to promote efficient gait pattern.    demonstrated and verbal cues required for proper form     Knee/Hip Exercises: Seated   Long Arc Quad  2 sets;10 reps;Strengthening;Left   tactile cues for full knee extension   Hamstring Curl  1 set;10 reps   with red theraband     Knee/Hip Exercises: Supine   Quad Sets  1 set;10 reps    Short Arc Quad Sets  1 set;10 reps    Heel Slides  1 set;10 reps;AAROM;Left      Manual Therapy   Manual Therapy  Taping    Kinesiotex  Edema      Kinesiotix   Edema  L knee             PT Education - 04/03/18 1241    Education Details  proper gait training form utilizing heel strike/ toe off, benefits of KT Taping for edema and length of wear.     Person(s) Educated  Patient    Methods  Explanation;Verbal cues;Demonstration    Comprehension  Verbalized understanding;Verbal  cues required;Returned demonstration       PT Short Term Goals - 03/13/18 0858      PT SHORT TERM GOAL #1   Title  pt to be I with inital HEP    Time  4    Period  Weeks    Status  New    Target Date  04/10/18      PT SHORT TERM GOAL #2   Title  pt to verbalize and demo techniques to reduce pain and inflammation via RICE and HEP     Time  4    Period  Weeks    Status  New    Target Date  04/10/18      PT SHORT TERM GOAL #3   Title  pt to increase increase knee rom from </= 10 degrees to >/= 100 degreeswith </= 8/10  to promote functional mobility     Time  4    Period  Weeks    Status  New    Target Date  04/10/18      PT SHORT TERM GOAL #4   Title  pt to demonstrate overall reduction of >/= 2 cm in edema to promote knee mobility and reduce pain     Time  4    Period  Weeks    Status  New    Target Date  04/10/18        PT Long Term Goals - 03/13/18 0904      PT LONG TERM GOAL #1   Title  pt to increase knee ROM to </= 2 to >/=120 degrees to promote functional and efficent gait pattern with </= 2/10 pain     Time  8    Period  Weeks    Status  New    Target Date  05/08/18      PT LONG TERM GOAL #2   Title  pt to increase L knee strength to >/= 4+/5 to promote knee stability with walking/ standing activities to maximze balance and safety    Time  8    Period  Weeks  Status  New    Target Date  05/08/18      PT LONG TERM GOAL #3   Title  pt to be abel to stand and walk for >/= 30 min with LRAD to promote in home and community ambulation with </=2/10 pain     Time  8    Period  Weeks    Status  New    Target Date  05/08/18      PT LONG TERM GOAL #4   Title  pt to increase FOTO score to </= 54% limited to demo improvement in function     Time  8    Period  Weeks    Status  New    Target Date  05/08/18      PT LONG TERM GOAL #5   Title  pt to be I with all HEP given as of last visit to maintain and progress current level of function     Time  8     Period  Weeks    Status  New    Target Date  05/08/18            Plan - 04/03/18 1239    Clinical Impression Statement  pt reports she has popping and soreness inthe knee but reports 4/10 today. She was able to get full revolutions today on the bike but had to go backward for 2 min before going forward. continued working on hip / knee strength and gait training which she required verbal cues/ demonstration for proper form.     PT Treatment/Interventions  ADLs/Self Care Home Management;Cryotherapy;Electrical Stimulation;Iontophoresis 28m/ml Dexamethasone;Moist Heat;Traction;Balance training;Neuromuscular re-education;Gait training;Therapeutic activities;Therapeutic exercise;Manual techniques;Functional mobility training;Vasopneumatic Device;Taping;Passive range of motion;Patient/family education;Ultrasound    PT Next Visit Plan  how was KT taping, update HEP, knee ROM/ stretching, knee/ hip activation, ankle strenght, vaso PRN    PT Home Exercise Plan  quad set, SLR with quad set, hamstring stretch, standing heel raise, cross friction massage, antegrade/ retrograde massage    Consulted and Agree with Plan of Care  Patient       Patient will benefit from skilled therapeutic intervention in order to improve the following deficits and impairments:  Abnormal gait, Pain, Increased fascial restricitons, Decreased strength, Increased edema, Decreased activity tolerance, Decreased mobility, Decreased endurance, Decreased balance, Decreased range of motion, Impaired flexibility  Visit Diagnosis: Chronic pain of left knee  Stiffness of left knee, not elsewhere classified  Localized edema  Other abnormalities of gait and mobility     Problem List Patient Active Problem List   Diagnosis Date Noted  . Primary osteoarthritis of left knee 02/10/2018  . History of prosthetic unicompartmental arthroplasty of left knee 02/07/2018  . Acute pelvic pain, female 09/02/2016  . DDD (degenerative disc  disease), lumbar 03/25/2015   KStarr LakePT, DPT, LAT, ATC  04/03/18  12:43 PM      [facility] [address]Phone: [phone]  Fax:  [phone] Name: Melissa GOLOMBMRN: [id number] of Birth: [birth year]       PHYSICAL THERAPY DISCHARGE SUMMARY  Visits from Start of Care: 4  Current functional level related to goals / functional outcomes: See goals   Remaining deficits: unknown   Education / Equipment: HEP, theraband, posture  Plan: Patient agrees to discharge.  Patient goals were not met. Patient is being discharged due to not returning since the last visit.  ?????  Mayes Sangiovanni PT, DPT, LAT, ATC  05/18/18  1:56 PM

## 2018-04-05 ENCOUNTER — Ambulatory Visit: Payer: Medicare Other | Attending: Orthopedic Surgery | Admitting: Physical Therapy

## 2018-04-05 DIAGNOSIS — M25562 Pain in left knee: Secondary | ICD-10-CM | POA: Insufficient documentation

## 2018-04-05 DIAGNOSIS — R600 Localized edema: Secondary | ICD-10-CM | POA: Insufficient documentation

## 2018-04-05 DIAGNOSIS — M25662 Stiffness of left knee, not elsewhere classified: Secondary | ICD-10-CM | POA: Insufficient documentation

## 2018-04-05 DIAGNOSIS — G8929 Other chronic pain: Secondary | ICD-10-CM | POA: Insufficient documentation

## 2018-04-05 DIAGNOSIS — R2689 Other abnormalities of gait and mobility: Secondary | ICD-10-CM | POA: Insufficient documentation

## 2018-04-06 ENCOUNTER — Telehealth: Payer: Self-pay | Admitting: Physical Therapy

## 2018-04-06 NOTE — Telephone Encounter (Signed)
LVM regarding missed appointment and when the next scheduled appointment is. If she is unable to make the next appointment, to prevent a no-show from counting against her to call and cancel or reschedule the appointment.

## 2018-04-10 ENCOUNTER — Ambulatory Visit: Payer: Medicare Other | Admitting: Physical Therapy

## 2018-04-10 DIAGNOSIS — R600 Localized edema: Secondary | ICD-10-CM

## 2018-04-10 DIAGNOSIS — M25662 Stiffness of left knee, not elsewhere classified: Secondary | ICD-10-CM

## 2018-04-10 DIAGNOSIS — G8929 Other chronic pain: Secondary | ICD-10-CM

## 2018-04-10 DIAGNOSIS — M25562 Pain in left knee: Principal | ICD-10-CM

## 2018-04-10 DIAGNOSIS — R2689 Other abnormalities of gait and mobility: Secondary | ICD-10-CM

## 2018-04-10 NOTE — Therapy (Signed)
[facility] [address] [place], [place], [address] Phone: [phone]   Fax:  [phone]  Physical Therapy Treatment  Patient Details  Name: Melissa [name] MRN: [id number] Date of Birth: 1976/05/11 Referring Provider (PT): Gean Birchwood MD   Encounter Date: 04/10/2018    Past Medical History:  Diagnosis Date  . Anemia   . Asthma    as a young adult - not for many years  . Depression   . Headache    migraines  . Heart murmur    2004 - told that when she had her son - no follow ups   . Hidradenitis 2007  . History of kidney stones   . Seizures (HCC)    as a baby     Past Surgical History:  Procedure Laterality Date  . APPENDECTOMY  1997  . AXILLARY HIDRADENITIS EXCISION Bilateral 2007  . BACK SURGERY    . CESAREAN SECTION    . CHOLECYSTECTOMY    . INGUINAL HIDRADENITIS EXCISION  06-18-14   left  . KIDNEY STONE SURGERY    . KNEE ARTHROSCOPY Left 12/29/2015   Procedure: ARTHROSCOPY KNEE;  Surgeon: Deeann Saint, MD;  Location: [facility] ORS;  Service: Orthopedics;  Laterality: Left;  . KNEE SURGERY Left 1997  . LAPAROSCOPY N/A 09/02/2016   Procedure: LAPAROSCOPY OPERATIVE;  Surgeon: Christeen Douglas, MD;  Location: [facility] ORS;  Service: Gynecology;  Laterality: N/A;  . POSTERIOR LUMBAR FUSION N/A 03/25/2015   Procedure: POSTERIOR LUMBAR INTERBODY FUSION LUMBAR FOUR-FIVE;  Surgeon: Julio Sicks, MD;  Location: [facility] OR;  Service: Neurosurgery;  Laterality: N/A;  . TOTAL KNEE REVISION Left 02/10/2018   Procedure: TOTAL KNEE REVISION;  Surgeon: Gean Birchwood, MD;  Location: [facility];  Service: Orthopedics;  Laterality: Left;  . TUBAL LIGATION      There were no vitals filed for this visit.                              PT Short Term Goals - 03/13/18 0858      PT SHORT TERM GOAL #1   Title  pt to be I with inital HEP    Time  4    Period  Weeks    Status  New    Target Date  04/10/18      PT SHORT TERM  GOAL #2   Title  pt to verbalize and demo techniques to reduce pain and inflammation via RICE and HEP     Time  4    Period  Weeks    Status  New    Target Date  04/10/18      PT SHORT TERM GOAL #3   Title  pt to increase increase knee rom from </= 10 degrees to >/= 100 degreeswith </= 8/10  to promote functional mobility     Time  4    Period  Weeks    Status  New    Target Date  04/10/18      PT SHORT TERM GOAL #4   Title  pt to demonstrate overall reduction of >/= 2 cm in edema to promote knee mobility and reduce pain     Time  4    Period  Weeks    Status  New    Target Date  04/10/18        PT Long Term Goals - 03/13/18 0904      PT LONG TERM GOAL #  1   Title  pt to increase knee ROM to </= 2 to >/=120 degrees to promote functional and efficent gait pattern with </= 2/10 pain     Time  8    Period  Weeks    Status  New    Target Date  05/08/18      PT LONG TERM GOAL #2   Title  pt to increase L knee strength to >/= 4+/5 to promote knee stability with walking/ standing activities to maximze balance and safety    Time  8    Period  Weeks    Status  New    Target Date  05/08/18      PT LONG TERM GOAL #3   Title  pt to be abel to stand and walk for >/= 30 min with LRAD to promote in home and community ambulation with </=2/10 pain     Time  8    Period  Weeks    Status  New    Target Date  05/08/18      PT LONG TERM GOAL #4   Title  pt to increase FOTO score to </= 54% limited to demo improvement in function     Time  8    Period  Weeks    Status  New    Target Date  05/08/18      PT LONG TERM GOAL #5   Title  pt to be I with all HEP given as of last visit to maintain and progress current level of function     Time  8    Period  Weeks    Status  New    Target Date  05/08/18            Plan - 04/10/18 1038    Clinical Impression Statement  pt arrived to her appointment in tears due to having family in the hospital and multiple other issues outside of  what she is currently being seen for, but also in combination with continued pain and swelling in the knee. due to the increased emotional distress and need to follow up with family in the hospital opted to hold off on todays visit.        Patient will benefit from skilled therapeutic intervention in order to improve the following deficits and impairments:     Visit Diagnosis: Chronic pain of left knee  Stiffness of left knee, not elsewhere classified  Localized edema  Other abnormalities of gait and mobility     Problem List Patient Active Problem List   Diagnosis Date Noted  . Primary osteoarthritis of left knee 02/10/2018  . History of prosthetic unicompartmental arthroplasty of left knee 02/07/2018  . Acute pelvic pain, female 09/02/2016  . DDD (degenerative disc disease), lumbar 03/25/2015   Lulu Riding PT, DPT, LAT, ATC  04/10/18  10:41 AM      [facility] [address] [place], [place], [address] Phone: [phone]   Fax:  [phone]  Name: Melissa [name] MRN: [id number] Date of Birth: [birth year]

## 2018-04-12 ENCOUNTER — Ambulatory Visit: Payer: Medicare Other | Admitting: Physical Therapy

## 2018-04-12 ENCOUNTER — Telehealth: Payer: Self-pay | Admitting: Physical Therapy

## 2018-04-12 DIAGNOSIS — F17210 Nicotine dependence, cigarettes, uncomplicated: Secondary | ICD-10-CM | POA: Diagnosis not present

## 2018-04-12 DIAGNOSIS — F322 Major depressive disorder, single episode, severe without psychotic features: Secondary | ICD-10-CM | POA: Diagnosis not present

## 2018-04-12 DIAGNOSIS — L732 Hidradenitis suppurativa: Secondary | ICD-10-CM | POA: Diagnosis not present

## 2018-04-12 DIAGNOSIS — Z639 Problem related to primary support group, unspecified: Secondary | ICD-10-CM | POA: Diagnosis not present

## 2018-04-12 DIAGNOSIS — G43109 Migraine with aura, not intractable, without status migrainosus: Secondary | ICD-10-CM | POA: Diagnosis not present

## 2018-04-12 DIAGNOSIS — D509 Iron deficiency anemia, unspecified: Secondary | ICD-10-CM | POA: Diagnosis not present

## 2018-04-12 NOTE — Telephone Encounter (Signed)
LVM regarding missing appointment today and that it was her last scheduled appointment.  I hoped all was going well. To get back on the schedule she needed to call us back in order to continued with physical therapy.

## 2018-04-15 ENCOUNTER — Encounter (HOSPITAL_COMMUNITY): Payer: Self-pay | Admitting: Emergency Medicine

## 2018-04-15 ENCOUNTER — Emergency Department (HOSPITAL_COMMUNITY)
Admission: EM | Admit: 2018-04-15 | Discharge: 2018-04-15 | Disposition: A | Discharge status: Home / Self Care | Payer: Medicare Other | Attending: Emergency Medicine | Admitting: Emergency Medicine

## 2018-04-15 DIAGNOSIS — Z91040 Latex allergy status: Secondary | ICD-10-CM | POA: Insufficient documentation

## 2018-04-15 DIAGNOSIS — R0689 Other abnormalities of breathing: Secondary | ICD-10-CM | POA: Diagnosis not present

## 2018-04-15 DIAGNOSIS — F439 Reaction to severe stress, unspecified: Secondary | ICD-10-CM | POA: Insufficient documentation

## 2018-04-15 DIAGNOSIS — F17210 Nicotine dependence, cigarettes, uncomplicated: Secondary | ICD-10-CM | POA: Insufficient documentation

## 2018-04-15 DIAGNOSIS — F329 Major depressive disorder, single episode, unspecified: Secondary | ICD-10-CM | POA: Diagnosis not present

## 2018-04-15 DIAGNOSIS — Z79899 Other long term (current) drug therapy: Secondary | ICD-10-CM | POA: Diagnosis not present

## 2018-04-15 LAB — RAPID URINE DRUG SCREEN, HOSP PERFORMED
Amphetamines: NOT DETECTED
Barbiturates: NOT DETECTED
Benzodiazepines: NOT DETECTED
COCAINE: NOT DETECTED
OPIATES: NOT DETECTED
TETRAHYDROCANNABINOL: NOT DETECTED

## 2018-04-15 MED ORDER — BUPROPION HCL 100 MG PO TABS: 100.0000 mg | ORAL_TABLET | Freq: Three times a day (TID) | ORAL | 0 refills | Status: DC

## 2018-04-15 MED ORDER — IBUPROFEN 400 MG PO TABS
600.0000 mg | ORAL_TABLET | Freq: Once | ORAL | Status: AC
  Administered 2018-04-15: 600 mg via ORAL
  Filled 2018-04-15: qty 2

## 2018-04-15 NOTE — ED Notes (Signed)
Patient requesting something for headache, Dr Adriana Simas made aware-order to be given.

## 2018-04-15 NOTE — ED Triage Notes (Signed)
Pt reports she is having a lot of different emotions right now.  Recently went to pcp and had medication adjustment (increase in Wellbutrin).  States she feels her husband does not love her anymore and they are just roommates.  Pt reports she was having thoughts of suicide due to not feeling like herself.  Denies current thoughts of suicide.

## 2018-04-15 NOTE — ED Provider Notes (Signed)
[facility] EMERGENCY DEPARTMENT Provider Note   CSN: [id number] Arrival date & time: 04/15/18  1111     History   Chief Complaint Chief Complaint  Patient presents with  . V70.1    HPI Melissa [name] is a 42 y.o. female.  Depression and anxiety for unknown length of time.  Patient reports "increased stress" at home, but would not elaborate.  She is married and does not work outside the home.  She has 5 children.  Recently her Wellbutrin was increased from 100 mg twice a day to 200 mg twice a day.  She thinks this may be affecting her sense of well-being.  She reports a remote thoughts of suicide, but none currently.     Past Medical History:  Diagnosis Date  . Anemia   . Asthma    as a young adult - not for many years  . Depression   . Headache    migraines  . Heart murmur    2004 - told that when she had her son - no follow ups   . Hidradenitis 2007  . History of kidney stones   . Seizures (HCC)    as a baby     Patient Active Problem List   Diagnosis Date Noted  . Primary osteoarthritis of left knee 02/10/2018  . History of prosthetic unicompartmental arthroplasty of left knee 02/07/2018  . Acute pelvic pain, female 09/02/2016  . DDD (degenerative disc disease), lumbar 03/25/2015    Past Surgical History:  Procedure Laterality Date  . APPENDECTOMY  1997  . AXILLARY HIDRADENITIS EXCISION Bilateral 2007  . BACK SURGERY    . CESAREAN SECTION    . CHOLECYSTECTOMY    . INGUINAL HIDRADENITIS EXCISION  06-18-14   left  . KIDNEY STONE SURGERY    . KNEE ARTHROSCOPY Left 12/29/2015   Procedure: ARTHROSCOPY KNEE;  Surgeon: Deeann Saint, MD;  Location: [facility] ORS;  Service: Orthopedics;  Laterality: Left;  . KNEE SURGERY Left 1997  . LAPAROSCOPY N/A 09/02/2016   Procedure: LAPAROSCOPY OPERATIVE;  Surgeon: Christeen Douglas, MD;  Location: [facility] ORS;  Service: Gynecology;  Laterality: N/A;  . POSTERIOR LUMBAR FUSION N/A 03/25/2015   Procedure: POSTERIOR LUMBAR INTERBODY  FUSION LUMBAR FOUR-FIVE;  Surgeon: Julio Sicks, MD;  Location: [facility] OR;  Service: Neurosurgery;  Laterality: N/A;  . TOTAL KNEE REVISION Left 02/10/2018   Procedure: TOTAL KNEE REVISION;  Surgeon: Gean Birchwood, MD;  Location: [facility] OR;  Service: Orthopedics;  Laterality: Left;  . TUBAL LIGATION       OB History    Gravida  7   Para  3   Term      Preterm      AB  3   Living  5     SAB  3   TAB      Ectopic      Multiple      Live Births           Obstetric Comments  1st Menstrual Cycle:  11 1st Pregnancy:  18         Home Medications    Prior to Admission medications   Medication Sig Start Date End Date Taking? Authorizing Provider  buPROPion (WELLBUTRIN SR) 200 MG 12 hr tablet Take 1 tablet by mouth 2 (two) times daily. 04/12/18  Yes [provider]  cyclobenzaprine (FLEXERIL) 5 MG tablet Take 1 tablet by mouth 2 (two) times daily as needed. 03/27/18  Yes [provider]  doxycycline (VIBRA-TABS) 100  MG tablet Take 1 tablet by mouth 2 (two) times daily. 04/12/18  Yes [provider]  FLUoxetine (PROZAC) 10 MG capsule Take 1 capsule by mouth daily. 01/15/18  Yes [provider]  gabapentin (NEURONTIN) 300 MG capsule Take 1 capsule by mouth 3 (three) times daily. 03/01/18  Yes [provider]  ibuprofen (ADVIL,MOTRIN) 200 MG tablet Take 400 mg by mouth every 6 (six) hours as needed.   Yes [provider]  SUMAtriptan (IMITREX) 50 MG tablet Take 1 tablet by mouth daily as needed. 03/27/18  Yes [provider]  topiramate (TOPAMAX) 25 MG tablet Take 1 tablet by mouth 2 (two) times daily. 03/23/18  Yes [provider]    Family History Family History  Problem Relation Age of Onset  . Diabetes Mother   . Hypertension Mother     Social History Social History   Tobacco Use  . Smoking status: Current Some Day Smoker    Packs/day: 0.10    Years: 10.00    Pack years: 1.00    Types: Cigarettes  .  Smokeless tobacco: Never Used  . Tobacco comment: smokes 1 in the morning occasionally  Substance Use Topics  . Alcohol use: Not Currently    Comment: occassional beer  . Drug use: No     Allergies   Tylenol [acetaminophen]; Celebrex [celecoxib]; Latex; Meloxicam; Other; Oxycodone; and Ultram [tramadol hcl]   Review of Systems Review of Systems  All other systems reviewed and are negative.    Physical Exam Updated Vital Signs BP 133/82 (BP Location: Right Arm)   Pulse 89   Temp 99.1 F (37.3 C) (Oral)   Resp 17   Ht 5\' 4"  (1.626 m)   Wt 90.7 kg   LMP 04/07/2018   SpO2 100%   BMI 34.33 kg/m   Physical Exam  Constitutional: She is oriented to person, place, and time. She appears well-developed and well-nourished.  HENT:  Head: Normocephalic and atraumatic.  Eyes: Conjunctivae are normal.  Neck: Neck supple.  Cardiovascular: Normal rate and regular rhythm.  Pulmonary/Chest: Effort normal and breath sounds normal.  Abdominal: Soft. Bowel sounds are normal.  Musculoskeletal: Normal range of motion.  Neurological: She is alert and oriented to person, place, and time.  Skin: Skin is warm and dry.  Psychiatric:  Flat affect, depressed  Nursing note and vitals reviewed.    ED Treatments / Results  Labs (all labs ordered are listed, but only abnormal results are displayed) Labs Reviewed  RAPID URINE DRUG SCREEN, HOSP PERFORMED  CBC WITH DIFFERENTIAL/PLATELET  COMPREHENSIVE METABOLIC PANEL  ETHANOL    EKG None  Radiology No results found.  Procedures Procedures (including critical care time)  Medications Ordered in ED Medications - No data to display   Initial Impression / Assessment and Plan / ED Course  I have reviewed the triage vital signs and the nursing notes.  Pertinent labs & imaging results that were available during my care of the patient were reviewed by me and considered in my medical decision making (see chart for details).      Patient reports increased stress and depression for an unknown length of time.  No frank suicidal ideation.  She thinks this may be related to the increase in Wellbutrin recently.  Behavioral health consult obtained.  Patient is stable for discharge.  Recommending decreasing her Wellbutrin and following up with her therapist  Final Clinical Impressions(s) / ED Diagnoses   Final diagnoses:  Stress    ED Discharge  Orders    None       Donnetta Hutching, MD 04/15/18 [phone]

## 2018-04-15 NOTE — BH Assessment (Signed)
Assessment Note  Melissa [name] is an 42 y.o. female who was brought to [facility] after Daughter called EMS.  Patient acknowledged at the time she was crying and acting hysterical and was "not my normal self."  Patient reports starting to experience intense mixed emotions 2 days ago when daily dosage of Wellbutrin was increased from 200 mg to 400 mg by PCP.  She reports experiencing the emotions of anger, sadness, and happiness all at one time.  Patient also reports sleeping only 2 hours per night and having a poor appetite.  She reports being prescribed Wellbutrin for anxiety and depression.  Patient denied receiving current mental health treatment.  She reports her PCP did make a referral to a Therapist, however she is unable to recall the name, but have plans to call and schedule first appointment.  Patient denied SI with intent or plans.  She reports experiencing passive SI of "wanting God to take me away from all of this (intense mixed emotions)."  Patient denied HI and AVH.  Patient denied current substance use.  Patient reports being married with 5 children, ages 67, 54, 58, 63, 59 and not being employed.  She reported experiencing some recent stressors, however would not elaborate when asked.  Patient reports as a teenager she attempted SI 3x and was hospitalized for 60 days.  She denied any other psychiatric hospitalizations since that time.  Patient's 14 year old Son, Jobie Quaker, was present and provided the following collateral information.  Mr. Dan Humphreys reports the Patient has not been her "normal self" for the past 2 days.  He reports his Sister called him this morning when he was at work and stated the Patient was crying and acting hysterical.  Mr. Dan Humphreys reports telling his Sister to call 911.  He reports the Patient did not disclose or indicate to him that she is suicidal.  Mr. Dan Humphreys reports being concerned about the Patient's medication increase.    Patient presented orientated x4, mood "axious and  depressed", affect labile with crying and agitation.  Patient denied SI, HI, and AVH.  Thought process was coherent, logical, and linear.    Per Maryjean Morn, Extender;  Patient does not meet inpatient criteria.    Dr. Adriana Simas provided Patient's disposition.       Diagnosis: Major Depression, moderate  Past Medical History:  Past Medical History:  Diagnosis Date  . Anemia   . Asthma    as a young adult - not for many years  . Depression   . Headache    migraines  . Heart murmur    2004 - told that when she had her son - no follow ups   . Hidradenitis 2007  . History of kidney stones   . Seizures (HCC)    as a baby     Past Surgical History:  Procedure Laterality Date  . APPENDECTOMY  1997  . AXILLARY HIDRADENITIS EXCISION Bilateral 2007  . BACK SURGERY    . CESAREAN SECTION    . CHOLECYSTECTOMY    . INGUINAL HIDRADENITIS EXCISION  06-18-14   left  . KIDNEY STONE SURGERY    . KNEE ARTHROSCOPY Left 12/29/2015   Procedure: ARTHROSCOPY KNEE;  Surgeon: Deeann Saint, MD;  Location: [facility] ORS;  Service: Orthopedics;  Laterality: Left;  . KNEE SURGERY Left 1997  . LAPAROSCOPY N/A 09/02/2016   Procedure: LAPAROSCOPY OPERATIVE;  Surgeon: Christeen Douglas, MD;  Location: [facility] ORS;  Service: Gynecology;  Laterality: N/A;  . POSTERIOR LUMBAR FUSION N/A  03/25/2015   Procedure: POSTERIOR LUMBAR INTERBODY FUSION LUMBAR FOUR-FIVE;  Surgeon: Julio Sicks, MD;  Location: [facility] OR;  Service: Neurosurgery;  Laterality: N/A;  . TOTAL KNEE REVISION Left 02/10/2018   Procedure: TOTAL KNEE REVISION;  Surgeon: Gean Birchwood, MD;  Location: [facility] OR;  Service: Orthopedics;  Laterality: Left;  . TUBAL LIGATION      Family History:  Family History  Problem Relation Age of Onset  . Diabetes Mother   . Hypertension Mother     Social History:  reports that she has been smoking cigarettes. She has a 1.00 pack-year smoking history. She has never used smokeless tobacco. She reports that she drank alcohol. She  reports that she does not use drugs.  Additional Social History:  Alcohol / Drug Use Pain Medications: Patient denied current use   CIWA: CIWA-Ar BP: 133/82 Pulse Rate: 89 COWS:    Allergies:  Allergies  Allergen Reactions  . Tylenol [Acetaminophen] Itching and Other (See Comments)    Throat itch and high fever (no reaction to hydrocodone with acetaminophen)  . Celebrex [Celecoxib] Nausea And Vomiting  . Latex Rash    Powder in gloves causes rash  . Meloxicam Nausea And Vomiting  . Other Rash    Powder in gloves causes rash  . Oxycodone Rash  . Ultram [Tramadol Hcl] Nausea And Vomiting    Itching burning  (Can take Oxycodone)    Home Medications:  (Not in a hospital admission)  OB/GYN Status:  Patient's last menstrual period was 04/07/2018.  General Assessment Data Location of Assessment: [facility] TTS Assessment: In system Is this a Tele or Face-to-Face Assessment?: Tele Assessment Is this an Initial Assessment or a Re-assessment for this encounter?: Initial Assessment Patient Accompanied by:: N/A Language Other than English: No Living Arrangements: Other (Comment) What gender do you identify as?: Female Marital status: Married Maiden name: Walker Pregnancy Status: No Living Arrangements: Spouse/significant other, Children(Reports living w/Spouse and 5 chjildren) Can pt return to current living arrangement?: Yes Admission Status: Voluntary Is patient capable of signing voluntary admission?: Yes Referral Source: Other(Daughter) Insurance type: Medicare  Medical Screening Exam [facility] Walk-in ONLY) Medical Exam completed: Yes  Crisis Care Plan Living Arrangements: Spouse/significant other, Children(Reports living w/Spouse and 5 chjildren) Legal Guardian: (N/A) Name of Psychiatrist: None Name of Therapist: None  Education Status Is patient currently in school?: No Is the patient employed, unemployed or receiving disability?: Unemployed  Risk to self with the past  6 months Suicidal Ideation: No-Not Currently/Within Last 6 Months Has patient been a risk to self within the past 6 months prior to admission? : No Suicidal Intent: No Has patient had any suicidal intent within the past 6 months prior to admission? : No Is patient at risk for suicide?: No Suicidal Plan?: No Has patient had any suicidal plan within the past 6 months prior to admission? : No Access to Means: No What has been your use of drugs/alcohol within the last 12 months?: Patient denied substance use Previous Attempts/Gestures: Yes How many times?: 3(Patient reports attempts occurred when she was a teenager) Other Self Harm Risks: None Triggers for Past Attempts: Unknown Intentional Self Injurious Behavior: None Family Suicide History: No Recent stressful life event(s): Other (Comment)(Medication dosage increased) Persecutory voices/beliefs?: No Depression: Yes Depression Symptoms: Tearfulness, Feeling angry/irritable, Insomnia Substance abuse history and/or treatment for substance abuse?: Yes(Alcohol) Suicide prevention information given to non-admitted patients: Not applicable  Risk to Others within the past 6 months Homicidal Ideation: No Does patient have any lifetime  risk of violence toward others beyond the six months prior to admission? : No Thoughts of Harm to Others: No Current Homicidal Intent: No Current Homicidal Plan: No Access to Homicidal Means: No Identified Victim: N/A History of harm to others?: No Assessment of Violence: None Noted Violent Behavior Description: N/A Does patient have access to weapons?: No Criminal Charges Pending?: No Does patient have a court date: No Is patient on probation?: No  Psychosis Hallucinations: None noted Delusions: None noted  Mental Status Report Appearance/Hygiene: In hospital gown Eye Contact: Poor Motor Activity: Agitation Speech: Logical/coherent, Soft Level of Consciousness: Alert, Crying Mood: Depressed,  Anxious, Labile, Irritable Affect: Other (Comment)(Congruent with mood) Anxiety Level: Moderate Thought Processes: Coherent, Relevant Judgement: Unimpaired Orientation: Person, Place, Time, Situation Obsessive Compulsive Thoughts/Behaviors: None  Cognitive Functioning Concentration: Good Memory: Recent Intact, Remote Intact Is patient IDD: No Insight: Good Impulse Control: Fair Appetite: Poor Have you had any weight changes? : No Change Sleep: Decreased Total Hours of Sleep: 2 Vegetative Symptoms: None  ADLScreening [facility] Assessment Services) Patient's cognitive ability adequate to safely complete daily activities?: Yes Patient able to express need for assistance with ADLs?: Yes Independently performs ADLs?: Yes (appropriate for developmental age)  Prior Inpatient Therapy Prior Inpatient Therapy: Yes Prior Therapy Dates: Teenage years Prior Therapy Facilty/Provider(s): Unknown Reason for Treatment: 3 suicide attempts  Prior Outpatient Therapy Prior Outpatient Therapy: No Does patient have Intensive In-House Services?  : No Does patient have [facility] services? : No Does patient have [facility] services?: No  ADL Screening (condition at time of admission) Patient's cognitive ability adequate to safely complete daily activities?: Yes Is the patient deaf or have difficulty hearing?: No Does the patient have difficulty seeing, even when wearing glasses/contacts?: No Does the patient have difficulty concentrating, remembering, or making decisions?: No Patient able to express need for assistance with ADLs?: Yes Does the patient have difficulty dressing or bathing?: No Independently performs ADLs?: Yes (appropriate for developmental age) Does the patient have difficulty walking or climbing stairs?: No Weakness of Legs: None Weakness of Arms/Hands: None  Home Assistive Devices/Equipment Home Assistive Devices/Equipment: None      Values / Beliefs Cultural Requests During  Hospitalization: None Spiritual Requests During Hospitalization: None   Advance Directives (For Healthcare) Does Patient Have a Medical Advance Directive?: No Would patient like information on creating a medical advance directive?: No - Patient declined          Disposition:  Disposition Initial Assessment Completed for this Encounter: Yes Patient referred to: Other (Comment)(Patient already referred to Therapist)  On Site Evaluation by:   Reviewed with Physician:    Dey-Johnson,Dub Maclellan 04/15/2018 1:18 PM

## 2018-04-15 NOTE — ED Notes (Signed)
Pt tearful at this time and after explaining [facility] hold process to pt she stated, "why would she do this to me?"  Asked pt what she meant by this and states "the drugs, why would the doctor do that to my drugs."  Explained to pt potential adverse effects of medications as well as other causes for her symptoms, but stressed the importance of discussing her feelings and emotions to obtain the root cause to be addressed.

## 2018-04-15 NOTE — Discharge Instructions (Addendum)
Your blood work has not returned yet.  Follow up with community mental health resources (phone number given). recommend returning to original dose of Wellbutrin

## 2018-04-17 DIAGNOSIS — G43109 Migraine with aura, not intractable, without status migrainosus: Secondary | ICD-10-CM | POA: Diagnosis not present

## 2018-04-17 DIAGNOSIS — G43119 Migraine with aura, intractable, without status migrainosus: Secondary | ICD-10-CM | POA: Diagnosis not present

## 2018-05-24 DIAGNOSIS — M545 Low back pain: Secondary | ICD-10-CM | POA: Diagnosis not present

## 2018-05-24 DIAGNOSIS — G4700 Insomnia, unspecified: Secondary | ICD-10-CM | POA: Diagnosis not present

## 2018-05-24 DIAGNOSIS — F419 Anxiety disorder, unspecified: Secondary | ICD-10-CM | POA: Diagnosis not present

## 2018-05-24 DIAGNOSIS — G43701 Chronic migraine without aura, not intractable, with status migrainosus: Secondary | ICD-10-CM | POA: Diagnosis not present

## 2019-04-29 ENCOUNTER — Other Ambulatory Visit: Payer: Self-pay

## 2019-04-29 ENCOUNTER — Emergency Department (HOSPITAL_COMMUNITY)
Admission: EM | Admit: 2019-04-29 | Discharge: 2019-04-29 | Disposition: A | Discharge status: Home / Self Care | Payer: Medicare Other | Attending: Emergency Medicine | Admitting: Emergency Medicine

## 2019-04-29 ENCOUNTER — Encounter (HOSPITAL_COMMUNITY): Payer: Self-pay | Admitting: Emergency Medicine

## 2019-04-29 DIAGNOSIS — Z79899 Other long term (current) drug therapy: Secondary | ICD-10-CM | POA: Insufficient documentation

## 2019-04-29 DIAGNOSIS — F17210 Nicotine dependence, cigarettes, uncomplicated: Secondary | ICD-10-CM | POA: Insufficient documentation

## 2019-04-29 DIAGNOSIS — J45909 Unspecified asthma, uncomplicated: Secondary | ICD-10-CM | POA: Insufficient documentation

## 2019-04-29 DIAGNOSIS — L03012 Cellulitis of left finger: Secondary | ICD-10-CM | POA: Diagnosis not present

## 2019-04-29 DIAGNOSIS — M79642 Pain in left hand: Secondary | ICD-10-CM | POA: Diagnosis present

## 2019-04-29 DIAGNOSIS — R569 Unspecified convulsions: Secondary | ICD-10-CM | POA: Insufficient documentation

## 2019-04-29 MED ORDER — LIDOCAINE HCL (PF) 1 % IJ SOLN
INTRAMUSCULAR | Status: AC
  Administered 2019-04-29: 12:00:00 5 mL
  Filled 2019-04-29: qty 5

## 2019-04-29 MED ORDER — POVIDONE-IODINE 10 % EX SOLN
CUTANEOUS | Status: DC | PRN
  Administered 2019-04-29: 12:00:00 via TOPICAL
  Filled 2019-04-29: qty 15

## 2019-04-29 MED ORDER — SULFAMETHOXAZOLE-TRIMETHOPRIM 800-160 MG PO TABS: 1.0000 | ORAL_TABLET | Freq: Two times a day (BID) | ORAL | 0 refills | Status: AC

## 2019-04-29 MED ORDER — LIDOCAINE HCL (PF) 2 % IJ SOLN: 5.0000 mL | Freq: Once | INTRAMUSCULAR | Status: DC

## 2019-04-29 NOTE — Discharge Instructions (Addendum)
Soak your thumb in warm water several times a day until the area heals.  Keep your hand elevated as needed.  Keep the area clean until it heals.  Return to the ER or your primary provider for recheck if needed.

## 2019-04-29 NOTE — ED Provider Notes (Signed)
[facility] EMERGENCY DEPARTMENT Provider Note   CSN: [id number] Arrival date & time: 04/29/19  1113     History   Chief Complaint Chief Complaint  Patient presents with  . Hand Pain    HPI Melissa [name] is a 43 y.o. female.     HPI   Melissa [name] is a 43 y.o. female who presents to the Emergency Department complaining of pain, swelling, and discoloration of the radial side of the left thumb.  Symptoms have been present for 1 week and gradually worsening.  She notes having a "whitish" discoloration and throbbing pain along that side of her thumb.  She states the pain is constant and worse with movement of her hand or with palpation to that side of her thumb.  She has been applying over-the-counter topical agents without relief.  She denies pain proximal to her thumb, red streaking, numbness or known injury.  She denies nail biting.    Past Medical History:  Diagnosis Date  . Anemia   . Asthma    as a young adult - not for many years  . Depression   . Headache    migraines  . Heart murmur    2004 - told that when she had her son - no follow ups   . Hidradenitis 2007  . History of kidney stones   . Seizures (HCC)    as a baby     Patient Active Problem List   Diagnosis Date Noted  . Primary osteoarthritis of left knee 02/10/2018  . History of prosthetic unicompartmental arthroplasty of left knee 02/07/2018  . Acute pelvic pain, female 09/02/2016  . DDD (degenerative disc disease), lumbar 03/25/2015    Past Surgical History:  Procedure Laterality Date  . APPENDECTOMY  1997  . AXILLARY HIDRADENITIS EXCISION Bilateral 2007  . BACK SURGERY    . CESAREAN SECTION    . CHOLECYSTECTOMY    . INGUINAL HIDRADENITIS EXCISION  06-18-14   left  . KIDNEY STONE SURGERY    . KNEE ARTHROSCOPY Left 12/29/2015   Procedure: ARTHROSCOPY KNEE;  Surgeon: Deeann Saint, MD;  Location: [facility] ORS;  Service: Orthopedics;  Laterality: Left;  . KNEE SURGERY Left 1997  . LAPAROSCOPY  N/A 09/02/2016   Procedure: LAPAROSCOPY OPERATIVE;  Surgeon: Christeen Douglas, MD;  Location: [facility] ORS;  Service: Gynecology;  Laterality: N/A;  . POSTERIOR LUMBAR FUSION N/A 03/25/2015   Procedure: POSTERIOR LUMBAR INTERBODY FUSION LUMBAR FOUR-FIVE;  Surgeon: Julio Sicks, MD;  Location: [facility] OR;  Service: Neurosurgery;  Laterality: N/A;  . TOTAL KNEE REVISION Left 02/10/2018   Procedure: TOTAL KNEE REVISION;  Surgeon: Gean Birchwood, MD;  Location: [facility] OR;  Service: Orthopedics;  Laterality: Left;  . TUBAL LIGATION       OB History    Gravida  7   Para  3   Term      Preterm      AB  3   Living  5     SAB  3   TAB      Ectopic      Multiple      Live Births           Obstetric Comments  1st Menstrual Cycle:  11 1st Pregnancy:  18         Home Medications    Prior to Admission medications   Medication Sig Start Date End Date Taking? Authorizing Provider  buPROPion (WELLBUTRIN) 100 MG tablet Take 1 tablet (100 mg total) by  mouth 3 (three) times daily. Take 2 tablets in the morning and 1 tablet in the evening. 04/15/18   Donnetta Hutchingook, Brian, MD  cyclobenzaprine (FLEXERIL) 5 MG tablet Take 1 tablet by mouth 2 (two) times daily as needed. 03/27/18   [provider]  doxycycline (VIBRA-TABS) 100 MG tablet Take 1 tablet by mouth 2 (two) times daily. 04/12/18   [provider]  FLUoxetine (PROZAC) 10 MG capsule Take 1 capsule by mouth daily. 01/15/18   [provider]  gabapentin (NEURONTIN) 300 MG capsule Take 1 capsule by mouth 3 (three) times daily. 03/01/18   [provider]  ibuprofen (ADVIL,MOTRIN) 200 MG tablet Take 400 mg by mouth every 6 (six) hours as needed.    [provider]  SUMAtriptan (IMITREX) 50 MG tablet Take 1 tablet by mouth daily as needed. 03/27/18   [provider]  topiramate (TOPAMAX) 25 MG tablet Take 1 tablet by mouth 2 (two) times daily. 03/23/18   [provider]    Family History Family History   Problem Relation Age of Onset  . Diabetes Mother   . Hypertension Mother     Social History Social History   Tobacco Use  . Smoking status: Current Some Day Smoker    Packs/day: 0.50    Years: 10.00    Pack years: 5.00    Types: Cigarettes  . Smokeless tobacco: Never Used  . Tobacco comment: smokes 1 in the morning occasionally  Substance Use Topics  . Alcohol use: Not Currently    Comment: occassional beer  . Drug use: No     Allergies   Tylenol [acetaminophen], Celebrex [celecoxib], Latex, Meloxicam, Other, Oxycodone, and Ultram [tramadol hcl]   Review of Systems Review of Systems  Constitutional: Negative for chills and fever.  Musculoskeletal: Positive for arthralgias (Left thumb pain, swelling). Negative for joint swelling and myalgias.  Skin: Negative for color change and wound.  Neurological: Negative for syncope and weakness.     Physical Exam Updated Vital Signs BP 129/75 (BP Location: Left Arm)   Pulse 85   Temp 97.7 F (36.5 C) (Oral)   Resp 18   Ht 5\' 4"  (1.626 m)   Wt 81 kg   LMP 04/01/2019 (Approximate)   SpO2 100%   BMI 30.65 kg/m   Physical Exam Vitals signs and nursing note reviewed.  Constitutional:      Appearance: Normal appearance. She is not ill-appearing.  Cardiovascular:     Rate and Rhythm: Normal rate and regular rhythm.     Pulses: Normal pulses.  Musculoskeletal: Normal range of motion.        General: Signs of injury present.     Left hand: She exhibits tenderness. She exhibits no bony tenderness and normal two-point discrimination. Normal sensation noted.     Comments: Mild edema and fluctuance noted to the radial aspect of the left thumbnail along the lateral nail fold, no lymphangitis or erythema. No joint tenderness  Skin:    General: Skin is warm.     Capillary Refill: Capillary refill takes less than 2 seconds.  Neurological:     Mental Status: She is alert.      ED Treatments / Results  Labs (all labs ordered  are listed, but only abnormal results are displayed) Labs Reviewed - No data to display  EKG None  Radiology No results found.  Procedures .Marland Kitchen.Incision and Drainage  Date/Time: 04/29/2019 12:39 PM Performed by: Pauline Ausriplett, Kawhi Diebold, PA-C Authorized by: Pauline Ausriplett, Breyon Blass, PA-C   Consent:  Consent obtained:  Verbal   Consent given by:  Patient   Risks discussed:  Incomplete drainage, pain and infection   Alternatives discussed:  Delayed treatment Location:    Type:  Abscess   Size:  1 cm   Location:  Upper extremity   Upper extremity location:  Finger   Finger location:  L thumb Pre-procedure details:    Skin preparation:  Betadine Anesthesia (see MAR for exact dosages):    Anesthesia method:  Local infiltration   Local anesthetic:  Lidocaine 1% w/o epi Procedure type:    Complexity:  Simple Procedure details:    Needle aspiration: no     Incision types:  Stab incision   Scalpel blade:  11   Wound management:  Probed and deloculated and irrigated with saline   Drainage:  Purulent   Drainage amount:  Scant   Packing materials:  None Post-procedure details:    Patient tolerance of procedure:  Tolerated well, no immediate complications   (including critical care time)    Medications Ordered in ED Medications  povidone-iodine (BETADINE) 10 % external solution ( Topical Given 04/29/19 1206)  lidocaine (XYLOCAINE) 2 % injection 5 mL (has no administration in time range)  lidocaine (PF) (XYLOCAINE) 1 % injection (5 mLs  Given 04/29/19 1206)     Initial Impression / Assessment and Plan / ED Course  I have reviewed the triage vital signs and the nursing notes.  Pertinent labs & imaging results that were available during my care of the patient were reviewed by me and considered in my medical decision making (see chart for details).        Patient with successful drainage of a paronychia.  No concerning sx's for felon.  NV intact.  Pain improved.  She agrees treatment  plan and close outpatient follow-up.  Return precautions discussed.  Final Clinical Impressions(s) / ED Diagnoses   Final diagnoses:  Paronychia of finger of left hand    ED Discharge Orders    None       Kem Parkinson, PA-C 04/29/19 1316    Noemi Chapel, MD 05/02/19 1007

## 2019-04-29 NOTE — ED Triage Notes (Signed)
Pt c/o pain, swelling, and "discoloration" to LT thumb x 1 week. Denies injury or fevers.

## 2019-05-05 ENCOUNTER — Encounter (HOSPITAL_COMMUNITY): Payer: Self-pay | Admitting: *Deleted

## 2019-05-05 ENCOUNTER — Other Ambulatory Visit: Payer: Self-pay

## 2019-05-05 ENCOUNTER — Emergency Department (HOSPITAL_COMMUNITY)
Admission: EM | Admit: 2019-05-05 | Discharge: 2019-05-05 | Disposition: A | Discharge status: Home / Self Care | Payer: Medicare Other | Attending: Emergency Medicine | Admitting: Emergency Medicine

## 2019-05-05 DIAGNOSIS — F17210 Nicotine dependence, cigarettes, uncomplicated: Secondary | ICD-10-CM | POA: Diagnosis not present

## 2019-05-05 DIAGNOSIS — M79645 Pain in left finger(s): Secondary | ICD-10-CM | POA: Diagnosis present

## 2019-05-05 DIAGNOSIS — L03012 Cellulitis of left finger: Secondary | ICD-10-CM | POA: Insufficient documentation

## 2019-05-05 DIAGNOSIS — Z79899 Other long term (current) drug therapy: Secondary | ICD-10-CM | POA: Insufficient documentation

## 2019-05-05 MED ORDER — POVIDONE-IODINE 10 % EX SOLN: Freq: Once | CUTANEOUS | Status: DC

## 2019-05-05 MED ORDER — IBUPROFEN 400 MG PO TABS
600.0000 mg | ORAL_TABLET | Freq: Once | ORAL | Status: AC
  Administered 2019-05-05: 10:00:00 600 mg via ORAL
  Filled 2019-05-05: qty 2

## 2019-05-05 MED ORDER — POVIDONE-IODINE 10 % EX SOLN: CUTANEOUS | Status: DC | PRN

## 2019-05-05 MED ORDER — CEPHALEXIN 500 MG PO CAPS: 500.0000 mg | ORAL_CAPSULE | Freq: Three times a day (TID) | ORAL | 0 refills | Status: DC

## 2019-05-05 MED ORDER — IBUPROFEN 800 MG PO TABS: 800.0000 mg | ORAL_TABLET | Freq: Three times a day (TID) | ORAL | 0 refills | Status: DC | PRN

## 2019-05-05 MED ORDER — BUPIVACAINE HCL (PF) 0.25 % IJ SOLN: 30.0000 mL | Freq: Once | INTRAMUSCULAR | Status: DC

## 2019-05-05 MED ORDER — POVIDONE-IODINE 10 % EX SOLN
CUTANEOUS | Status: AC
  Filled 2019-05-05: qty 15

## 2019-05-05 MED ORDER — BUPIVACAINE HCL (PF) 0.25 % IJ SOLN
10.0000 mL | Freq: Once | INTRAMUSCULAR | Status: DC
  Filled 2019-05-05: qty 30

## 2019-05-05 NOTE — ED Triage Notes (Addendum)
Pt c/o continued pain, swelling and "purple color" to left thumb. Pt reports she was seen here on 04/29/19 and had it drained and was given antibiotics. Pt has finished antibiotics despite having severe vomiting while taking it.

## 2019-05-05 NOTE — ED Provider Notes (Signed)
[facility] EMERGENCY DEPARTMENT Provider Note   CSN: [id number] Arrival date & time: 05/05/19  6503     History   Chief Complaint Chief Complaint  Patient presents with  . Hand Pain    HPI Melissa [name] is a 43 y.o. female.     Patient to Ed with worsening/recurrent pain and swelling adjacent to her left thumb nail. She was seen on 04/29/19 and diagnosed with a paronychia which was drained in the emergency department. She was started on antibiotics and states she has taken them as prescribed and completes treatment tonight with her last dose. She has done as instructed with warm soaks and expressing the area to allow for continued drainage. No fever. No redness or pain that extends proximally.  The history is provided by the patient. No language interpreter was used.  Hand Pain    Past Medical History:  Diagnosis Date  . Anemia   . Asthma    as a young adult - not for many years  . Depression   . Headache    migraines  . Heart murmur    2004 - told that when she had her son - no follow ups   . Hidradenitis 2007  . History of kidney stones   . Seizures (HCC)    as a baby     Patient Active Problem List   Diagnosis Date Noted  . Primary osteoarthritis of left knee 02/10/2018  . History of prosthetic unicompartmental arthroplasty of left knee 02/07/2018  . Acute pelvic pain, female 09/02/2016  . DDD (degenerative disc disease), lumbar 03/25/2015    Past Surgical History:  Procedure Laterality Date  . APPENDECTOMY  1997  . AXILLARY HIDRADENITIS EXCISION Bilateral 2007  . BACK SURGERY    . CESAREAN SECTION    . CHOLECYSTECTOMY    . INGUINAL HIDRADENITIS EXCISION  06-18-14   left  . KIDNEY STONE SURGERY    . KNEE ARTHROSCOPY Left 12/29/2015   Procedure: ARTHROSCOPY KNEE;  Surgeon: Deeann Saint, MD;  Location: [facility] ORS;  Service: Orthopedics;  Laterality: Left;  . KNEE SURGERY Left 1997  . LAPAROSCOPY N/A 09/02/2016   Procedure: LAPAROSCOPY OPERATIVE;   Surgeon: Christeen Douglas, MD;  Location: [facility] ORS;  Service: Gynecology;  Laterality: N/A;  . POSTERIOR LUMBAR FUSION N/A 03/25/2015   Procedure: POSTERIOR LUMBAR INTERBODY FUSION LUMBAR FOUR-FIVE;  Surgeon: Julio Sicks, MD;  Location: [facility] OR;  Service: Neurosurgery;  Laterality: N/A;  . TOTAL KNEE REVISION Left 02/10/2018   Procedure: TOTAL KNEE REVISION;  Surgeon: Gean Birchwood, MD;  Location: [facility] OR;  Service: Orthopedics;  Laterality: Left;  . TUBAL LIGATION       OB History    Gravida  7   Para  3   Term      Preterm      AB  3   Living  5     SAB  3   TAB      Ectopic      Multiple      Live Births           Obstetric Comments  1st Menstrual Cycle:  11 1st Pregnancy:  18         Home Medications    Prior to Admission medications   Medication Sig Start Date End Date Taking? Authorizing Provider  buPROPion (WELLBUTRIN) 100 MG tablet Take 1 tablet (100 mg total) by mouth 3 (three) times daily. Take 2 tablets in the morning and 1 tablet in the  evening. 04/15/18   Donnetta Hutchingook, Brian, MD  cyclobenzaprine (FLEXERIL) 5 MG tablet Take 1 tablet by mouth 2 (two) times daily as needed. 03/27/18   [provider]  doxycycline (VIBRA-TABS) 100 MG tablet Take 1 tablet by mouth 2 (two) times daily. 04/12/18   [provider]  FLUoxetine (PROZAC) 10 MG capsule Take 1 capsule by mouth daily. 01/15/18   [provider]  gabapentin (NEURONTIN) 300 MG capsule Take 1 capsule by mouth 3 (three) times daily. 03/01/18   [provider]  ibuprofen (ADVIL,MOTRIN) 200 MG tablet Take 400 mg by mouth every 6 (six) hours as needed.    [provider]  sulfamethoxazole-trimethoprim (BACTRIM DS) 800-160 MG tablet Take 1 tablet by mouth 2 (two) times daily for 7 days. 04/29/19 05/06/19  Triplett, Tammy, PA-C  SUMAtriptan (IMITREX) 50 MG tablet Take 1 tablet by mouth daily as needed. 03/27/18   [provider]  topiramate (TOPAMAX) 25 MG tablet Take 1 tablet  by mouth 2 (two) times daily. 03/23/18   [provider]    Family History Family History  Problem Relation Age of Onset  . Diabetes Mother   . Hypertension Mother     Social History Social History   Tobacco Use  . Smoking status: Current Some Day Smoker    Packs/day: 0.50    Years: 10.00    Pack years: 5.00    Types: Cigarettes  . Smokeless tobacco: Never Used  . Tobacco comment: smokes 1 in the morning occasionally  Substance Use Topics  . Alcohol use: Not Currently    Comment: occassional beer  . Drug use: No     Allergies   Tylenol [acetaminophen], Celebrex [celecoxib], Latex, Meloxicam, Other, Oxycodone, and Ultram [tramadol hcl]   Review of Systems Review of Systems  Constitutional: Negative for fever.  Musculoskeletal:       See HPI.  Skin: Negative for color change.  Neurological: Negative for numbness.     Physical Exam Updated Vital Signs BP 112/74 (BP Location: Right Arm)   Pulse 63   Temp 98.5 F (36.9 C) (Oral)   Resp 16   Ht 5\' 4"  (1.626 m)   Wt 80.7 kg   LMP  (Within Weeks)   SpO2 100%   BMI 30.55 kg/m   Physical Exam Constitutional:      Appearance: She is well-developed.  Neck:     Musculoskeletal: Normal range of motion.  Pulmonary:     Effort: Pulmonary effort is normal.  Musculoskeletal:     Comments: Left medial thumb has a small collection of pus along the nail edge c/w paronychia. Pad is soft and nontender. No subungual changes. No erythema.   Skin:    General: Skin is warm and dry.  Neurological:     Mental Status: She is alert and oriented to person, place, and time.      ED Treatments / Results  Labs (all labs ordered are listed, but only abnormal results are displayed) Labs Reviewed - No data to display  EKG None  Radiology No results found.  Procedures Drain paronychia  Date/Time: 05/05/2019 10:48 AM Performed by: Elpidio AnisUpstill, Alexzandria Massman, PA-C Authorized by: Elpidio AnisUpstill, Lyndell Allaire, PA-C  Consent: Verbal  consent obtained. Risks and benefits: risks, benefits and alternatives were discussed Consent given by: patient Patient understanding: patient states understanding of the procedure being performed Patient identity confirmed: verbally with patient Preparation: Patient was prepped and draped in the usual sterile fashion. Local anesthesia used: yes Anesthesia: digital block  Anesthesia:  Local anesthesia used: yes Local Anesthetic: bupivacaine 0.25% with epinephrine Anesthetic total: 4 mL  Sedation: Patient sedated: no  Patient tolerance: patient tolerated the procedure well with no immediate complications Comments: Thin piece lateral nail removed. No additional pus drainage.     (including critical care time)  Medications Ordered in ED Medications - No data to display   Initial Impression / Assessment and Plan / ED Course  I have reviewed the triage vital signs and the nursing notes.  Pertinent labs & imaging results that were available during my care of the patient were reviewed by me and considered in my medical decision making (see chart for details).        Patient to ED with recurrent paronychia without evidence felon. Will perform digital block and open the area a second time, plan to remove part of the fingernail to prevent further recurrence.   Procedure performed as above note. Will continue abx x 5 additional days. Will switch to Keflex as she had significant nausea with Septra. Pain management provided. Will refer to hand ortho Grand[facility] Silos) for recheck if symptoms persist.   Final Clinical Impressions(s) / ED Diagnoses   Final diagnoses:  None   1. Recurrent paronychia  ED Discharge Orders    None       Charlann Lange, PA-C 05/05/19 [place]    Nat Christen, MD 05/06/19 [phone]

## 2019-05-05 NOTE — Discharge Instructions (Addendum)
Take Keflex as directed. Because of your allergies (Tylenol, oxycodone, tramadol) you are limited to ibuprofen for pain.   Please call Dr. Grandville Silos to make an appointment if you feel there is no improvement in the next few days.

## 2019-05-25 ENCOUNTER — Other Ambulatory Visit: Payer: Self-pay

## 2019-05-25 DIAGNOSIS — Z20822 Contact with and (suspected) exposure to covid-19: Secondary | ICD-10-CM

## 2019-05-28 LAB — NOVEL CORONAVIRUS, NAA: SARS-CoV-2, NAA: NOT DETECTED

## 2019-05-29 ENCOUNTER — Telehealth: Payer: Self-pay | Admitting: Orthopedic Surgery

## 2019-05-29 NOTE — Telephone Encounter (Signed)
Patient called to inquire as to whether she may seek a second opinion, mainly due to treating close to home ([place]). States had a knee replacement about 1 year ago in [place], and said she had a partial knee replacement prior. Notes appear to indicate knee revision. Advise to continue treating with current orthopaedic surgeon?

## 2019-05-30 NOTE — Telephone Encounter (Signed)
Yes just explain our practice does not handle revisons or partial knee replacements

## 2019-05-30 NOTE — Telephone Encounter (Signed)
Called back to patient; voiced understanding °

## 2019-09-06 ENCOUNTER — Other Ambulatory Visit: Payer: Self-pay

## 2019-09-06 ENCOUNTER — Encounter: Payer: Self-pay | Admitting: Orthopaedic Surgery

## 2019-09-06 ENCOUNTER — Ambulatory Visit (INDEPENDENT_AMBULATORY_CARE_PROVIDER_SITE_OTHER): Payer: Medicare Other | Admitting: Orthopaedic Surgery

## 2019-09-06 ENCOUNTER — Ambulatory Visit: Payer: Medicare Other

## 2019-09-06 VITALS — BP 127/81 | HR 82 | Ht 64.0 in | Wt 183.0 lb

## 2019-09-06 DIAGNOSIS — G8929 Other chronic pain: Secondary | ICD-10-CM | POA: Diagnosis not present

## 2019-09-06 DIAGNOSIS — F17210 Nicotine dependence, cigarettes, uncomplicated: Secondary | ICD-10-CM | POA: Diagnosis not present

## 2019-09-06 DIAGNOSIS — M25562 Pain in left knee: Secondary | ICD-10-CM | POA: Diagnosis not present

## 2019-09-06 DIAGNOSIS — Z96652 Presence of left artificial knee joint: Secondary | ICD-10-CM

## 2019-09-06 NOTE — Patient Instructions (Signed)
[facility] phone number [phone]  Steps to Quit Smoking Smoking tobacco is the leading cause of preventable death. It can affect almost every organ in the body. Smoking puts you and people around you at risk for many serious, long-lasting (chronic) diseases. Quitting smoking can be hard, but it is one of the best things that you can do for your health. It is never too late to quit. How do I get ready to quit? When you decide to quit smoking, make a plan to help you succeed. Before you quit:  Pick a date to quit. Set a date within the next 2 weeks to give you time to prepare.  Write down the reasons why you are quitting. Keep this list in places where you will see it often.  Tell your family, friends, and co-workers that you are quitting. Their support is important.  Talk with your doctor about the choices that may help you quit.  Find out if your health insurance will pay for these treatments.  Know the people, places, things, and activities that make you want to smoke (triggers). Avoid them. What first steps can I take to quit smoking?  Throw away all cigarettes at home, at work, and in your car.  Throw away the things that you use when you smoke, such as ashtrays and lighters.  Clean your car. Make sure to empty the ashtray.  Clean your home, including curtains and carpets. What can I do to help me quit smoking? Talk with your doctor about taking medicines and seeing a counselor at the same time. You are more likely to succeed when you do both.  If you are pregnant or breastfeeding, talk with your doctor about counseling or other ways to quit smoking. Do not take medicine to help you quit smoking unless your doctor tells you to do so. To quit smoking: Quit right away  Quit smoking totally, instead of slowly cutting back on how much you smoke over a period of time.  Go to counseling. You are more likely to quit if you go to counseling sessions regularly. Take  medicine You may take medicines to help you quit. Some medicines need a prescription, and some you can buy over-the-counter. Some medicines may contain a drug called nicotine to replace the nicotine in cigarettes. Medicines may:  Help you to stop having the desire to smoke (cravings).  Help to stop the problems that come when you stop smoking (withdrawal symptoms). Your doctor may ask you to use:  Nicotine patches, gum, or lozenges.  Nicotine inhalers or sprays.  Non-nicotine medicine that is taken by mouth. Find resources Find resources and other ways to help you quit smoking and remain smoke-free after you quit. These resources are most helpful when you use them often. They include:  Online chats with a Veterinary surgeon.  Phone quitlines.  Printed Materials engineer.  Support groups or group counseling.  Text messaging programs.  Mobile phone apps. Use apps on your mobile phone or tablet that can help you stick to your quit plan. There are many free apps for mobile phones and tablets as well as websites. Examples include Quit Guide from the [employer] and smokefree.gov  What things can I do to make it easier to quit?   Talk to your family and friends. Ask them to support and encourage you.  Call a phone quitline (1-800-QUIT-NOW), reach out to support groups, or work with a Veterinary surgeon.  Ask people who smoke to not smoke around you.  Avoid places that make you want to smoke, such as: ? Bars. ? Parties. ? Smoke-break areas at work.  Spend time with people who do not smoke.  Lower the stress in your life. Stress can make you want to smoke. Try these things to help your stress: ? Getting regular exercise. ? Doing deep-breathing exercises. ? Doing yoga. ? Meditating. ? Doing a body scan. To do this, close your eyes, focus on one area of your body at a time from head to toe. Notice which parts of your body are tense. Try to relax the muscles in those areas. How will I feel when I quit  smoking? Day 1 to 3 weeks Within the first 24 hours, you may start to have some problems that come from quitting tobacco. These problems are very bad 2-3 days after you quit, but they do not often last for more than 2-3 weeks. You may get these symptoms:  Mood swings.  Feeling restless, nervous, angry, or annoyed.  Trouble concentrating.  Dizziness.  Strong desire for high-sugar foods and nicotine.  Weight gain.  Trouble pooping (constipation).  Feeling like you may vomit (nausea).  Coughing or a sore throat.  Changes in how the medicines that you take for other issues work in your body.  Depression.  Trouble sleeping (insomnia). Week 3 and afterward After the first 2-3 weeks of quitting, you may start to notice more positive results, such as:  Better sense of smell and taste.  Less coughing and sore throat.  Slower heart rate.  Lower blood pressure.  Clearer skin.  Better breathing.  Fewer sick days. Quitting smoking can be hard. Do not give up if you fail the first time. Some people need to try a few times before they succeed. Do your best to stick to your quit plan, and talk with your doctor if you have any questions or concerns. Summary  Smoking tobacco is the leading cause of preventable death. Quitting smoking can be hard, but it is one of the best things that you can do for your health.  When you decide to quit smoking, make a plan to help you succeed.  Quit smoking right away, not slowly over a period of time.  When you start quitting, seek help from your doctor, family, or friends. This information is not intended to replace advice given to you by your health care provider. Make sure you discuss any questions you have with your health care provider. Document Revised: 03/16/2019 Document Reviewed: 09/09/2018 Elsevier Patient Education  [place].

## 2019-09-06 NOTE — Progress Notes (Signed)
Subjective:    Patient ID: Melissa [name], female    DOB: 08-02-1975, 44 y.o.   MRN: [id number]  HPI She has long history of left knee pain.  She had a total knee done by Dr. Mayer Camel on the left knee 02-10-2018.  She did well at first.  Last year she started having pain and swelling.  She says she saw him and he told her that everything looked good and there was nothing further he could do.  She has asked to be seen for knee pain.  She did not mention she had prior knee surgery.  She has pain and swelling of the left knee.  She has no fever or chills. She has no redness. She has pain with ambulation after several feet. She has no giving way.  She has no redness.  She says the knee just hurts deeply.  She is tired of hurting.  She wants something done for the knee.  I told her I would get labs and x-rays but I do not do surgery anymore.  She can go to one of the universities nearby for further evaluation. She does not want to see Dr. Mayer Camel or his partners.  She is agreeable to this.  X-rays today did not show any loosening of the components.  I will get CBC and diff, sed rate and c-reactive protein labs.She will let me know where she would like to go next visit.   Review of Systems  Constitutional: Positive for activity change.  Musculoskeletal: Positive for arthralgias, gait problem and joint swelling.  All other systems reviewed and are negative.  For Review of Systems, all other systems reviewed and are negative.  The following is a summary of the past history medically, past history surgically, known current medicines, social history and family history.  This information is gathered electronically by the computer from prior information and documentation.  I review this each visit and have found including this information at this point in the chart is beneficial and informative.   Past Medical History:  Diagnosis Date  . Anemia   . Asthma    as a young adult - not for many years  .  Depression   . Headache    migraines  . Heart murmur    2004 - told that when she had her son - no follow ups   . Hidradenitis 2007  . History of kidney stones   . Seizures ([place])    as a baby     Past Surgical History:  Procedure Laterality Date  . APPENDECTOMY  1997  . AXILLARY HIDRADENITIS EXCISION Bilateral 2007  . BACK SURGERY    . CESAREAN SECTION    . CHOLECYSTECTOMY    . INGUINAL HIDRADENITIS EXCISION  06-18-14   left  . KIDNEY STONE SURGERY    . KNEE ARTHROSCOPY Left 12/29/2015   Procedure: ARTHROSCOPY KNEE;  Surgeon: Earnestine Leys, MD;  Location: [facility];  Service: Orthopedics;  Laterality: Left;  . KNEE SURGERY Left 1997  . LAPAROSCOPY N/A 09/02/2016   Procedure: LAPAROSCOPY OPERATIVE;  Surgeon: Benjaman Kindler, MD;  Location: [facility];  Service: Gynecology;  Laterality: N/A;  . POSTERIOR LUMBAR FUSION N/A 03/25/2015   Procedure: POSTERIOR LUMBAR INTERBODY FUSION LUMBAR FOUR-FIVE;  Surgeon: Earnie Larsson, MD;  Location: [place];  Service: Neurosurgery;  Laterality: N/A;  . TOTAL KNEE REVISION Left 02/10/2018   Procedure: TOTAL KNEE REVISION;  Surgeon: Frederik Pear, MD;  Location: [place];  Service: Orthopedics;  Laterality:  Left;  . TUBAL LIGATION      Current Outpatient Medications on File Prior to Visit  Medication Sig Dispense Refill  . buPROPion (WELLBUTRIN) 100 MG tablet Take 1 tablet (100 mg total) by mouth 3 (three) times daily. Take 2 tablets in the morning and 1 tablet in the evening. 90 tablet 0  . topiramate (TOPAMAX) 200 MG tablet Take 200 mg by mouth 2 (two) times daily.    Marland Kitchen zolpidem (AMBIEN) 10 MG tablet     . FLUoxetine (PROZAC) 10 MG capsule Take 1 capsule by mouth daily.    Marland Kitchen gabapentin (NEURONTIN) 300 MG capsule Take 1 capsule by mouth 3 (three) times daily.  0  . SUMAtriptan (IMITREX) 50 MG tablet Take 1 tablet by mouth daily as needed.     No current facility-administered medications on file prior to visit.    Social History   Socioeconomic History    . Marital status: Married    Spouse name: Not on file  . Number of children: Not on file  . Years of education: Not on file  . Highest education level: Not on file  Occupational History  . Not on file  Tobacco Use  . Smoking status: Current Some Day Smoker    Packs/day: 0.50    Years: 10.00    Pack years: 5.00    Types: Cigarettes  . Smokeless tobacco: Never Used  . Tobacco comment: smokes 1 in the morning occasionally  Substance and Sexual Activity  . Alcohol use: Not Currently    Comment: occassional beer  . Drug use: No  . Sexual activity: Not on file  Other Topics Concern  . Not on file  Social History Narrative  . Not on file   Social Determinants of Health   Financial Resource Strain:   . Difficulty of Paying Living Expenses: Not on file  Food Insecurity:   . Worried About Programme researcher, broadcasting/film/video in the Last Year: Not on file  . Ran Out of Food in the Last Year: Not on file  Transportation Needs:   . Lack of Transportation (Medical): Not on file  . Lack of Transportation (Non-Medical): Not on file  Physical Activity:   . Days of Exercise per Week: Not on file  . Minutes of Exercise per Session: Not on file  Stress:   . Feeling of Stress : Not on file  Social Connections:   . Frequency of Communication with Friends and Family: Not on file  . Frequency of Social Gatherings with Friends and Family: Not on file  . Attends Religious Services: Not on file  . Active Member of Clubs or Organizations: Not on file  . Attends Banker Meetings: Not on file  . Marital Status: Not on file  Intimate Partner Violence:   . Fear of Current or Ex-Partner: Not on file  . Emotionally Abused: Not on file  . Physically Abused: Not on file  . Sexually Abused: Not on file    Family History  Problem Relation Age of Onset  . Diabetes Mother   . Hypertension Mother     BP 127/81   Pulse 82   Ht 5\' 4"  (1.626 m)   Wt 183 lb (83 kg)   LMP 09/01/2019 (Exact Date)    BMI 31.41 kg/m   Body mass index is 31.41 kg/m.     Objective:   Physical Exam Vitals and nursing note reviewed.  Constitutional:      Appearance: She is well-developed.  HENT:     Head: Normocephalic and atraumatic.  Eyes:     Conjunctiva/sclera: Conjunctivae normal.     Pupils: Pupils are equal, round, and reactive to light.  Cardiovascular:     Rate and Rhythm: Normal rate and regular rhythm.  Pulmonary:     Effort: Pulmonary effort is normal.  Abdominal:     Palpations: Abdomen is soft.  Musculoskeletal:     Cervical back: Normal range of motion and neck supple.       Legs:  Skin:    General: Skin is warm and dry.  Neurological:     Mental Status: She is alert and oriented to person, place, and time.     Cranial Nerves: No cranial nerve deficit.     Motor: No abnormal muscle tone.     Coordination: Coordination normal.     Deep Tendon Reflexes: Reflexes are normal and symmetric. Reflexes normal.  Psychiatric:        Behavior: Behavior normal.        Thought Content: Thought content normal.        Judgment: Judgment normal.      X-rays were done of the left knee, reported separately.     Assessment & Plan:   Encounter Diagnoses  Name Primary?  . Chronic knee pain after total replacement of left knee joint Yes  . Nicotine dependence, cigarettes, uncomplicated    Labs ordered.  Return in two weeks.  Call if any problem.  Precautions discussed.   Electronically Signed Darreld Mclean, MD 3/4/202110:56 AM

## 2019-09-07 LAB — CBC WITH DIFFERENTIAL/PLATELET
Absolute Monocytes: 510 cells/uL (ref 200–950)
Basophils Absolute: 100 cells/uL (ref 0–200)
Basophils Relative: 1.1 %
Eosinophils Absolute: 291 cells/uL (ref 15–500)
Eosinophils Relative: 3.2 %
HCT: 42.4 % (ref 35.0–45.0)
Hemoglobin: 14.1 g/dL (ref 11.7–15.5)
Lymphs Abs: 2475 cells/uL (ref 850–3900)
MCH: 32.9 pg (ref 27.0–33.0)
MCHC: 33.3 g/dL (ref 32.0–36.0)
MCV: 99.1 fL (ref 80.0–100.0)
MPV: 10.3 fL (ref 7.5–12.5)
Monocytes Relative: 5.6 %
Neutro Abs: 5724 cells/uL (ref 1500–7800)
Neutrophils Relative %: 62.9 %
Platelets: 267 10*3/uL (ref 140–400)
RBC: 4.28 10*6/uL (ref 3.80–5.10)
RDW: 11.9 % (ref 11.0–15.0)
Total Lymphocyte: 27.2 %
WBC: 9.1 10*3/uL (ref 3.8–10.8)

## 2019-09-07 LAB — C-REACTIVE PROTEIN: CRP: 2.1 mg/L (ref <8.0)

## 2019-09-07 LAB — SEDIMENTATION RATE: Sed Rate: 14 mm/h (ref 0–20)

## 2019-09-20 ENCOUNTER — Ambulatory Visit: Payer: Medicare Other | Admitting: Orthopaedic Surgery

## 2019-09-25 ENCOUNTER — Other Ambulatory Visit: Payer: Self-pay

## 2019-09-25 ENCOUNTER — Encounter: Payer: Self-pay | Admitting: Orthopaedic Surgery

## 2019-09-25 ENCOUNTER — Ambulatory Visit (INDEPENDENT_AMBULATORY_CARE_PROVIDER_SITE_OTHER): Payer: Medicare Other | Admitting: Orthopaedic Surgery

## 2019-09-25 VITALS — BP 132/79 | HR 67 | Ht 64.0 in | Wt 183.0 lb

## 2019-09-25 DIAGNOSIS — G8929 Other chronic pain: Secondary | ICD-10-CM | POA: Diagnosis not present

## 2019-09-25 DIAGNOSIS — F17210 Nicotine dependence, cigarettes, uncomplicated: Secondary | ICD-10-CM

## 2019-09-25 DIAGNOSIS — M25562 Pain in left knee: Secondary | ICD-10-CM | POA: Diagnosis not present

## 2019-09-25 DIAGNOSIS — Z96652 Presence of left artificial knee joint: Secondary | ICD-10-CM | POA: Diagnosis not present

## 2019-09-25 NOTE — Patient Instructions (Signed)
Steps to Quit Smoking Smoking tobacco is the leading cause of preventable death. It can affect almost every organ in the body. Smoking puts you and people around you at risk for many serious, long-lasting (chronic) diseases. Quitting smoking can be hard, but it is one of the best things that you can do for your health. It is never too late to quit. How do I get ready to quit? When you decide to quit smoking, make a plan to help you succeed. Before you quit:  Pick a date to quit. Set a date within the next 2 weeks to give you time to prepare.  Write down the reasons why you are quitting. Keep this list in places where you will see it often.  Tell your family, friends, and co-workers that you are quitting. Their support is important.  Talk with your doctor about the choices that may help you quit.  Find out if your health insurance will pay for these treatments.  Know the people, places, things, and activities that make you want to smoke (triggers). Avoid them. What first steps can I take to quit smoking?  Throw away all cigarettes at home, at work, and in your car.  Throw away the things that you use when you smoke, such as ashtrays and lighters.  Clean your car. Make sure to empty the ashtray.  Clean your home, including curtains and carpets. What can I do to help me quit smoking? Talk with your doctor about taking medicines and seeing a counselor at the same time. You are more likely to succeed when you do both.  If you are pregnant or breastfeeding, talk with your doctor about counseling or other ways to quit smoking. Do not take medicine to help you quit smoking unless your doctor tells you to do so. To quit smoking: Quit right away  Quit smoking totally, instead of slowly cutting back on how much you smoke over a period of time.  Go to counseling. You are more likely to quit if you go to counseling sessions regularly. Take medicine You may take medicines to help you quit. Some  medicines need a prescription, and some you can buy over-the-counter. Some medicines may contain a drug called nicotine to replace the nicotine in cigarettes. Medicines may:  Help you to stop having the desire to smoke (cravings).  Help to stop the problems that come when you stop smoking (withdrawal symptoms). Your doctor may ask you to use:  Nicotine patches, gum, or lozenges.  Nicotine inhalers or sprays.  Non-nicotine medicine that is taken by mouth. Find resources Find resources and other ways to help you quit smoking and remain smoke-free after you quit. These resources are most helpful when you use them often. They include:  Online chats with a counselor.  Phone quitlines.  Printed self-help materials.  Support groups or group counseling.  Text messaging programs.  Mobile phone apps. Use apps on your mobile phone or tablet that can help you stick to your quit plan. There are many free apps for mobile phones and tablets as well as websites. Examples include Quit Guide from the CDC and smokefree.gov  What things can I do to make it easier to quit?   Talk to your family and friends. Ask them to support and encourage you.  Call a phone quitline (1-800-QUIT-NOW), reach out to support groups, or work with a counselor.  Ask people who smoke to not smoke around you.  Avoid places that make you want to smoke,   such as: ? Bars. ? Parties. ? Smoke-break areas at work.  Spend time with people who do not smoke.  Lower the stress in your life. Stress can make you want to smoke. Try these things to help your stress: ? Getting regular exercise. ? Doing deep-breathing exercises. ? Doing yoga. ? Meditating. ? Doing a body scan. To do this, close your eyes, focus on one area of your body at a time from head to toe. Notice which parts of your body are tense. Try to relax the muscles in those areas. How will I feel when I quit smoking? Day 1 to 3 weeks Within the first 24 hours,  you may start to have some problems that come from quitting tobacco. These problems are very bad 2-3 days after you quit, but they do not often last for more than 2-3 weeks. You may get these symptoms:  Mood swings.  Feeling restless, nervous, angry, or annoyed.  Trouble concentrating.  Dizziness.  Strong desire for high-sugar foods and nicotine.  Weight gain.  Trouble pooping (constipation).  Feeling like you may vomit (nausea).  Coughing or a sore throat.  Changes in how the medicines that you take for other issues work in your body.  Depression.  Trouble sleeping (insomnia). Week 3 and afterward After the first 2-3 weeks of quitting, you may start to notice more positive results, such as:  Better sense of smell and taste.  Less coughing and sore throat.  Slower heart rate.  Lower blood pressure.  Clearer skin.  Better breathing.  Fewer sick days. Quitting smoking can be hard. Do not give up if you fail the first time. Some people need to try a few times before they succeed. Do your best to stick to your quit plan, and talk with your doctor if you have any questions or concerns. Summary  Smoking tobacco is the leading cause of preventable death. Quitting smoking can be hard, but it is one of the best things that you can do for your health.  When you decide to quit smoking, make a plan to help you succeed.  Quit smoking right away, not slowly over a period of time.  When you start quitting, seek help from your doctor, family, or friends. This information is not intended to replace advice given to you by your health care provider. Make sure you discuss any questions you have with your health care provider. Document Revised: 03/16/2019 Document Reviewed: 09/09/2018 Elsevier Patient Education  2020 Elsevier Inc.  

## 2019-09-25 NOTE — Progress Notes (Signed)
Patient Melissa [name], female DOB:[birth year], 44 y.o. IWP:[id number]  Chief Complaint  Patient presents with  . Knee Pain    left    HPI  Melissa [name] is a 44 y.o. female who has a painful left total knee done by Dr. Turner Daniels in [place]. Her labs were normal.  She [facility] to [facility].  She wants to go to [facility]  She has chronic pain and swelling of the left knee but no redness.  I will arrange appointment.   Body mass index is 31.41 kg/m.  ROS  Review of Systems  Constitutional: Positive for activity change.  Musculoskeletal: Positive for arthralgias, gait problem and joint swelling.  All other systems reviewed and are negative.   All other systems reviewed and are negative.  The following is a summary of the past history medically, past history surgically, known current medicines, social history and family history.  This information is gathered electronically by the computer from prior information and documentation.  I review this each visit and have found including this information at this point in the chart is beneficial and informative.    Past Medical History:  Diagnosis Date  . Anemia   . Asthma    as a young adult - not for many years  . Depression   . Headache    migraines  . Heart murmur    2004 - told that when she had her son - no follow ups   . Hidradenitis 2007  . History of kidney stones   . Seizures ([facility])    as a baby     Past Surgical History:  Procedure Laterality Date  . APPENDECTOMY  1997  . AXILLARY HIDRADENITIS EXCISION Bilateral 2007  . BACK SURGERY    . CESAREAN SECTION    . CHOLECYSTECTOMY    . INGUINAL HIDRADENITIS EXCISION  06-18-14   left  . KIDNEY STONE SURGERY    . KNEE ARTHROSCOPY Left 12/29/2015   Procedure: ARTHROSCOPY KNEE;  Surgeon: Deeann Saint, MD;  Location: [facility];  Service: Orthopedics;  Laterality: Left;  . KNEE SURGERY Left 1997  . LAPAROSCOPY N/A 09/02/2016   Procedure:  LAPAROSCOPY OPERATIVE;  Surgeon: Christeen Douglas, MD;  Location: [facility];  Service: Gynecology;  Laterality: N/A;  . POSTERIOR LUMBAR FUSION N/A 03/25/2015   Procedure: POSTERIOR LUMBAR INTERBODY FUSION LUMBAR FOUR-FIVE;  Surgeon: Julio Sicks, MD;  [facility];  Service: Neurosurgery;  Laterality: N/A;  . TOTAL KNEE REVISION Left 02/10/2018   Procedure: TOTAL KNEE REVISION;  Surgeon: Gean Birchwood, MD;  Location: [facility];  Service: Orthopedics;  Laterality: Left;  . TUBAL LIGATION      Family History  Problem Relation Age of Onset  . Diabetes Mother   . Hypertension Mother     Social History Social History   Tobacco Use  . Smoking status: Current Some Day Smoker    Packs/day: 0.50    Years: 10.00    Pack years: 5.00    Types: Cigarettes  . Smokeless tobacco: Never Used  . Tobacco comment: smokes 1 in the morning occasionally  Substance Use Topics  . Alcohol use: Not Currently    Comment: occassional beer  . Drug use: No    Allergies  Allergen Reactions  . Tylenol [Acetaminophen] Itching and Other (See Comments)    Throat itch and high fever (no reaction to hydrocodone with acetaminophen)  . Celebrex [Celecoxib] Nausea And Vomiting  . Latex Rash  Powder in gloves causes rash  . Meloxicam Nausea And Vomiting  . Other Rash    Powder in gloves causes rash  . Oxycodone Rash  . Ultram [Tramadol Hcl] Nausea And Vomiting    Itching burning  (Can take Oxycodone)    Current Outpatient Medications  Medication Sig Dispense Refill  . buPROPion (WELLBUTRIN) 100 MG tablet Take 1 tablet (100 mg total) by mouth 3 (three) times daily. Take 2 tablets in the morning and 1 tablet in the evening. 90 tablet 0  . FLUoxetine (PROZAC) 10 MG capsule Take 1 capsule by mouth daily.    Marland Kitchen gabapentin (NEURONTIN) 300 MG capsule Take 1 capsule by mouth 3 (three) times daily.  0  . SUMAtriptan (IMITREX) 50 MG tablet Take 1 tablet by mouth daily as needed.    . topiramate (TOPAMAX) 200 MG tablet Take  200 mg by mouth 2 (two) times daily.    Marland Kitchen zolpidem (AMBIEN) 10 MG tablet      No current facility-administered medications for this visit.     Physical Exam  Blood pressure 132/79, pulse 67, height 5\' 4"  (1.626 m), weight 183 lb (83 kg), last menstrual period 09/01/2019.  Constitutional: overall normal hygiene, normal nutrition, well developed, normal grooming, normal body habitus. Assistive device:none  Musculoskeletal: gait and station Limp left, muscle tone and strength are normal, no tremors or atrophy is present.  .  Neurological: coordination overall normal.  Deep tendon reflex/nerve stretch intact.  Sensation normal.  Cranial nerves II-XII intact.   Skin:   Normal overall no scars, lesions, ulcers or rashes. No psoriasis.  Psychiatric: Alert and oriented x 3.  Recent memory intact, remote memory unclear.  Normal mood and affect. Well groomed.  Good eye contact.  Cardiovascular: overall no swelling, no varicosities, no edema bilaterally, normal temperatures of the legs and arms, no clubbing, cyanosis and good capillary refill.  Lymphatic: palpation is normal.  Left knee with well healed scars anteriorly.  She has some slight effusion and slight crepitus.  ROM is 0 to 100 or 105.  She limps to the left.  She complains of pain.  There is no increased warmth or color changes.  NV intact.  All other systems reviewed and are negative   The patient has been educated about the nature of the problem(s) and counseled on treatment options.  The patient appeared to understand what I have discussed and is in agreement with it.  Encounter Diagnoses  Name Primary?  . Chronic knee pain after total replacement of left knee joint Yes  . Nicotine dependence, cigarettes, uncomplicated     PLAN Call if any problems.  Precautions discussed.  Continue current medications.   Return to clinic to [facility] [place], MD 3/23/202111:38 AM

## 2019-10-01 ENCOUNTER — Telehealth: Payer: Self-pay | Admitting: Orthopaedic Surgery

## 2019-10-01 NOTE — Telephone Encounter (Signed)
No narcotic.  Take Aleve one or two twice a day after eating or Advil, three at a time three times a day after eating.

## 2019-10-01 NOTE — Telephone Encounter (Signed)
Romeka called and left a message stating that she could not get an appointment at [facility] until July.  She wants something for pain, either a pain medication or an anti-inflammatory.  She uses [facility]

## 2020-07-22 ENCOUNTER — Encounter (HOSPITAL_COMMUNITY): Payer: Self-pay | Admitting: Emergency Medicine

## 2020-07-22 ENCOUNTER — Emergency Department (HOSPITAL_COMMUNITY)
Admission: EM | Admit: 2020-07-22 | Discharge: 2020-07-22 | Disposition: A | Discharge status: Home / Self Care | Payer: Medicare Other | Attending: Emergency Medicine | Admitting: Emergency Medicine

## 2020-07-22 ENCOUNTER — Emergency Department (HOSPITAL_COMMUNITY): Payer: Medicare Other

## 2020-07-22 DIAGNOSIS — J45909 Unspecified asthma, uncomplicated: Secondary | ICD-10-CM | POA: Insufficient documentation

## 2020-07-22 DIAGNOSIS — Z91040 Latex allergy status: Secondary | ICD-10-CM | POA: Insufficient documentation

## 2020-07-22 DIAGNOSIS — F17210 Nicotine dependence, cigarettes, uncomplicated: Secondary | ICD-10-CM | POA: Diagnosis not present

## 2020-07-22 DIAGNOSIS — R079 Chest pain, unspecified: Secondary | ICD-10-CM | POA: Diagnosis present

## 2020-07-22 DIAGNOSIS — U071 COVID-19: Secondary | ICD-10-CM

## 2020-07-22 LAB — CBC
HCT: 41.2 % (ref 36.0–46.0)
Hemoglobin: 13.4 g/dL (ref 12.0–15.0)
MCH: 32.1 pg (ref 26.0–34.0)
MCHC: 32.5 g/dL (ref 30.0–36.0)
MCV: 98.6 fL (ref 80.0–100.0)
Platelets: 176 10*3/uL (ref 150–400)
RBC: 4.18 MIL/uL (ref 3.87–5.11)
RDW: 13 % (ref 11.5–15.5)
WBC: 4.3 10*3/uL (ref 4.0–10.5)
nRBC: 0 % (ref 0.0–0.2)

## 2020-07-22 LAB — D-DIMER, QUANTITATIVE: D-Dimer, Quant: 0.47 ug/mL-FEU (ref 0.00–0.50)

## 2020-07-22 LAB — BASIC METABOLIC PANEL
Anion gap: 9 (ref 5–15)
BUN: 6 mg/dL (ref 6–20)
CO2: 21 mmol/L — ABNORMAL LOW (ref 22–32)
Calcium: 8.5 mg/dL — ABNORMAL LOW (ref 8.9–10.3)
Chloride: 106 mmol/L (ref 98–111)
Creatinine, Ser: 0.74 mg/dL (ref 0.44–1.00)
GFR, Estimated: >60 mL/min (ref >60)
Glucose, Bld: 81 mg/dL (ref 70–99)
Potassium: 4 mmol/L (ref 3.5–5.1)
Sodium: 136 mmol/L (ref 135–145)

## 2020-07-22 LAB — TROPONIN I (HIGH SENSITIVITY): Troponin I (High Sensitivity): 4 ng/L (ref <18)

## 2020-07-22 LAB — RESP PANEL BY RT-PCR (FLU A&B, COVID) ARPGX2
Influenza A by PCR: NEGATIVE
Influenza B by PCR: NEGATIVE
SARS Coronavirus 2 by RT PCR: POSITIVE — AB

## 2020-07-22 MED ORDER — KETOROLAC TROMETHAMINE 30 MG/ML IJ SOLN
30.0000 mg | Freq: Once | INTRAMUSCULAR | Status: AC
  Administered 2020-07-22: 30 mg via INTRAMUSCULAR
  Filled 2020-07-22: qty 1

## 2020-07-22 MED ORDER — ONDANSETRON HCL 4 MG PO TABS: 4.0000 mg | ORAL_TABLET | Freq: Four times a day (QID) | ORAL | 0 refills | Status: DC

## 2020-07-22 MED ORDER — ONDANSETRON 4 MG PO TBDP
4.0000 mg | ORAL_TABLET | Freq: Once | ORAL | Status: AC
  Administered 2020-07-22: 4 mg via ORAL
  Filled 2020-07-22: qty 1

## 2020-07-22 MED ORDER — BENZONATATE 100 MG PO CAPS: 100.0000 mg | ORAL_CAPSULE | Freq: Three times a day (TID) | ORAL | 0 refills | Status: AC

## 2020-07-22 NOTE — ED Triage Notes (Signed)
Pt here from home with c/o chest pain off and on for 2 weeks , pt is a smoker , no heart hx according to pt

## 2020-07-22 NOTE — ED Notes (Signed)
Ambulated to WR  DC instructions discussed and verbalized understanding

## 2020-07-22 NOTE — ED Provider Notes (Signed)
[facility] EMERGENCY DEPARTMENT Provider Note   CSN: [id number] Arrival date & time: 07/22/20  1142     History No chief complaint on file.   Melissa [name] is a 45 y.o. female.  HPI   45 year old female with a history of asthma, anemia, headache, depression, heart murmur, hidradenitis, nephrolithiasis, seizures, who presents to the emergency department today for evaluation of chest pain and URI symptoms.  States she has had chest pain that has been constant for the last week.  She rates it an 8.5/10.  She states it is worse with certain movements, cough, palpation, sometimes with inspiration and sometimes with exertion.  She also reports that she has had a cough for the last 2 days.  She is also had a headache, rhinorrhea, lightheadedness, body aches, sweats and chills.  She denies any new bilateral lower extremity swelling or calf pain.  Denies any history of VTE.  She does smoke tobacco but has no early family history of heart disease.  She had no episodes of vomiting.  She denies any known COVID exposures.  States she has been vaccinated.  Past Medical History:  Diagnosis Date  . Anemia   . Asthma    as a young adult - not for many years  . Depression   . Headache    migraines  . Heart murmur    2004 - told that when she had her son - no follow ups   . Hidradenitis 2007  . History of kidney stones   . Seizures (HCC)    as a baby     Patient Active Problem List   Diagnosis Date Noted  . Primary osteoarthritis of left knee 02/10/2018  . History of prosthetic unicompartmental arthroplasty of left knee 02/07/2018  . Cigarette nicotine dependence without complication 01/04/2018  . Hidradenitis suppurativa 01/04/2018  . Impaired fasting glucose 01/04/2018  . Relationship dysfunction 01/04/2018  . Severe depression (HCC) 01/04/2018  . Migraine with aura and without status migrainosus, not intractable 11/22/2017  . Mixed obsessional thoughts and acts  11/22/2017  . Panic disorder with agoraphobia 11/22/2017  . Acute pelvic pain, female 09/02/2016  . Chondromalacia of left knee 12/16/2015  . DDD (degenerative disc disease), lumbar 03/25/2015    Past Surgical History:  Procedure Laterality Date  . APPENDECTOMY  1997  . AXILLARY HIDRADENITIS EXCISION Bilateral 2007  . BACK SURGERY    . CESAREAN SECTION    . CHOLECYSTECTOMY    . INGUINAL HIDRADENITIS EXCISION  06-18-14   left  . KIDNEY STONE SURGERY    . KNEE ARTHROSCOPY Left 12/29/2015   Procedure: ARTHROSCOPY KNEE;  Surgeon: Deeann Saint, MD;  Location: [facility] ORS;  Service: Orthopedics;  Laterality: Left;  . KNEE SURGERY Left 1997  . LAPAROSCOPY N/A 09/02/2016   Procedure: LAPAROSCOPY OPERATIVE;  Surgeon: Christeen Douglas, MD;  Location: [facility] ORS;  Service: Gynecology;  Laterality: N/A;  . POSTERIOR LUMBAR FUSION N/A 03/25/2015   Procedure: POSTERIOR LUMBAR INTERBODY FUSION LUMBAR FOUR-FIVE;  Surgeon: Julio Sicks, MD;  Location: [facility] OR;  Service: Neurosurgery;  Laterality: N/A;  . TOTAL KNEE REVISION Left 02/10/2018   Procedure: TOTAL KNEE REVISION;  Surgeon: Gean Birchwood, MD;  Location: [facility] OR;  Service: Orthopedics;  Laterality: Left;  . TUBAL LIGATION       OB History    Gravida  7   Para  3   Term      Preterm      AB  3   Living  5     SAB  3   IAB      Ectopic      Multiple      Live Births           Obstetric Comments  1st Menstrual Cycle:  11 1st Pregnancy:  66        Family History  Problem Relation Age of Onset  . Diabetes Mother   . Hypertension Mother     Social History   Tobacco Use  . Smoking status: Current Some Day Smoker    Packs/day: 0.50    Years: 10.00    Pack years: 5.00    Types: Cigarettes  . Smokeless tobacco: Never Used  . Tobacco comment: smokes 1 in the morning occasionally  Vaping Use  . Vaping Use: Never used  Substance Use Topics  . Alcohol use: Not Currently    Comment: occassional beer  . Drug use: No    Home  Medications Prior to Admission medications   Medication Sig Start Date End Date Taking? Authorizing Provider  benzonatate (TESSALON) 100 MG capsule Take 1 capsule (100 mg total) by mouth every 8 (eight) hours for 5 days. 07/22/20 07/27/20 Yes Nicodemus Denk S, PA-C  ondansetron (ZOFRAN) 4 MG tablet Take 1 tablet (4 mg total) by mouth every 6 (six) hours. 07/22/20  Yes Mita Vallo S, PA-C  buPROPion (WELLBUTRIN) 100 MG tablet Take 1 tablet (100 mg total) by mouth 3 (three) times daily. Take 2 tablets in the morning and 1 tablet in the evening. 04/15/18   Donnetta Hutching, MD  FLUoxetine (PROZAC) 10 MG capsule Take 1 capsule by mouth daily. 01/15/18   [provider]  gabapentin (NEURONTIN) 300 MG capsule Take 1 capsule by mouth 3 (three) times daily. 03/01/18   [provider]  SUMAtriptan (IMITREX) 50 MG tablet Take 1 tablet by mouth daily as needed. 03/27/18   [provider]  topiramate (TOPAMAX) 200 MG tablet Take 200 mg by mouth 2 (two) times daily.    [provider]  zolpidem (AMBIEN) 10 MG tablet  07/06/18   [provider]    Allergies    Tylenol [acetaminophen], Celebrex [celecoxib], Latex, Meloxicam, Other, Oxycodone, and Ultram [tramadol hcl]  Review of Systems   Review of Systems  Constitutional: Positive for chills and diaphoresis.  HENT: Positive for rhinorrhea. Negative for ear pain and sore throat.   Eyes: Negative for pain and visual disturbance.  Respiratory: Positive for cough and shortness of breath.   Cardiovascular: Positive for chest pain. Negative for palpitations and leg swelling.  Gastrointestinal: Positive for nausea. Negative for abdominal pain, constipation, diarrhea and vomiting.  Genitourinary: Negative for dysuria and hematuria.  Musculoskeletal: Positive for myalgias. Negative for back pain.  Skin: Negative for rash.  Neurological: Positive for headaches. Negative for seizures and syncope.  All other systems reviewed  and are negative.   Physical Exam Updated Vital Signs BP 128/75   Pulse 70   Temp 98.3 F (36.8 C) (Oral)   Resp 19   Ht 5\' 4"  (1.626 m)   Wt 89.8 kg   SpO2 98%   BMI 33.99 kg/m   Physical Exam Vitals and nursing note reviewed.  Constitutional:      General: She is not in acute distress.    Appearance: She is well-developed and well-nourished.  HENT:     Head: Normocephalic and atraumatic.  Eyes:     Conjunctiva/sclera: Conjunctivae normal.  Cardiovascular:     Rate and  Rhythm: Normal rate and regular rhythm.     Heart sounds: Normal heart sounds. No murmur heard.   Pulmonary:     Effort: Pulmonary effort is normal. No respiratory distress.     Breath sounds: Normal breath sounds. No wheezing, rhonchi or rales.  Chest:     Chest wall: Tenderness (reproduces pain) present.  Abdominal:     General: Bowel sounds are normal.     Palpations: Abdomen is soft.     Tenderness: There is no abdominal tenderness. There is no guarding or rebound.  Musculoskeletal:        General: No tenderness or edema.     Cervical back: Neck supple.     Right lower leg: No edema.     Left lower leg: No edema.  Skin:    General: Skin is warm and dry.  Neurological:     Mental Status: She is alert.  Psychiatric:        Mood and Affect: Mood and affect normal.     ED Results / Procedures / Treatments   Labs (all labs ordered are listed, but only abnormal results are displayed) Labs Reviewed  RESP PANEL BY RT-PCR (FLU A&B, COVID) ARPGX2 - Abnormal; Notable for the following components:      Result Value   SARS Coronavirus 2 by RT PCR POSITIVE (*)    All other components within normal limits  BASIC METABOLIC PANEL - Abnormal; Notable for the following components:   CO2 21 (*)    Calcium 8.5 (*)    All other components within normal limits  CBC  D-DIMER, QUANTITATIVE (NOT AT [facility])  TROPONIN I (HIGH SENSITIVITY)  TROPONIN I (HIGH SENSITIVITY)    EKG None  Radiology DG Chest 2  View  Result Date: 07/22/2020 CLINICAL DATA:  Chest pain. EXAM: CHEST - 2 VIEW COMPARISON:  05/31/2017. FINDINGS: Mediastinum and hilar structures normal. Heart size normal. Low lung volumes. Mild bibasilar atelectasis. No pleural effusion or pneumothorax. Degenerative change thoracic spine. IMPRESSION: Low lung volumes with mild bibasilar atelectasis. Electronically Signed   By: Maisie Fus  Register   On: 07/22/2020 12:53    Procedures Procedures (including critical care time)  Medications Ordered in ED Medications  ketorolac (TORADOL) 30 MG/ML injection 30 mg (30 mg Intramuscular Given 07/22/20 1638)  ondansetron (ZOFRAN-ODT) disintegrating tablet 4 mg (4 mg Oral Given 07/22/20 1638)    ED Course  I have reviewed the triage vital signs and the nursing notes.  Pertinent labs & imaging results that were available during my care of the patient were reviewed by me and considered in my medical decision making (see chart for details).    MDM Rules/Calculators/A&P                          45 year old female presenting for evaluation of chest pain and URI symptoms.  Chest pain does not sound typical for ACS.  Her troponin is negative and her EKG is reassuring.  Symptoms seem more related to a possible URI.  She also has a reproducible chest wall tenderness so this makes ACS even less suspicious.  Reviewed/interpreted labs which showed a nonacute CBC, BMP, troponin.  Chest x-ray showed mild bibasilar atelectasis.  D-dimer was negative making PE less likely.  COVID testing is positive.  This is likely the source of the patient's body aches/chest discomfort and URI symptoms.  Patient was given Toradol and Zofran here in the emergency department.  Vital signs have remained stable  in the ED.  She has not having any hypoxia and does not show any signs that she will require admission at this time.  We will give her a prescription for symptomatic management and have her follow-up with PCP.  Have advised on  specific return precautions.  She voices understanding the plan and reasons return.  Questions answered.  Patient stable for discharge.  Henrietta Dineheresa A Turbin was evaluated in Emergency Department on 07/22/2020 for the symptoms described in the history of present illness. She was evaluated in the context of the global COVID-19 pandemic, which necessitated consideration that the patient might be at risk for infection with the SARS-CoV-2 virus that causes COVID-19. Institutional protocols and algorithms that pertain to the evaluation of patients at risk for COVID-19 are in a state of rapid change based on information released by regulatory bodies including the CDC and federal and state organizations. These policies and algorithms were followed during the patient's care in the ED.   Final Clinical Impression(s) / ED Diagnoses Final diagnoses:  COVID    Rx / DC Orders ED Discharge Orders         Ordered    benzonatate (TESSALON) 100 MG capsule  Every 8 hours        07/22/20 1819    ondansetron (ZOFRAN) 4 MG tablet  Every 6 hours        07/22/20 1819           Karrie MeresCouture, Yennifer Segovia S, PA-C 07/22/20 1820    Milagros Lollykstra, Richard S, MD 07/23/20 [phone]

## 2020-07-22 NOTE — Discharge Instructions (Addendum)
You tested positive for covid.   You should be isolated for at least 5 days since the onset of your symptoms AND >72 hours after symptoms resolution (absence of fever without the use of fever reducing medicaiton and improvement in respiratory symptoms), whichever is longer  Take tessalon for cough and zofran as directed for nausea.   Please follow up with your primary care provider within 5-7 days for re-evaluation of your symptoms. If you do not have a primary care provider, information for a healthcare clinic has been provided for you to make arrangements for follow up care. Please return to the emergency department for any new or worsening symptoms.

## 2021-04-13 ENCOUNTER — Other Ambulatory Visit: Payer: Self-pay

## 2021-04-13 ENCOUNTER — Emergency Department (HOSPITAL_COMMUNITY)
Admission: EM | Admit: 2021-04-13 | Discharge: 2021-04-13 | Disposition: A | Discharge status: Home / Self Care | Payer: Medicare Other | Attending: Emergency Medicine | Admitting: Emergency Medicine

## 2021-04-13 ENCOUNTER — Encounter (HOSPITAL_COMMUNITY): Payer: Self-pay | Admitting: *Deleted

## 2021-04-13 ENCOUNTER — Emergency Department (HOSPITAL_COMMUNITY): Payer: Medicare Other

## 2021-04-13 DIAGNOSIS — Z91040 Latex allergy status: Secondary | ICD-10-CM | POA: Insufficient documentation

## 2021-04-13 DIAGNOSIS — Z96652 Presence of left artificial knee joint: Secondary | ICD-10-CM | POA: Diagnosis not present

## 2021-04-13 DIAGNOSIS — J45909 Unspecified asthma, uncomplicated: Secondary | ICD-10-CM | POA: Insufficient documentation

## 2021-04-13 DIAGNOSIS — X58XXXA Exposure to other specified factors, initial encounter: Secondary | ICD-10-CM | POA: Diagnosis not present

## 2021-04-13 DIAGNOSIS — S63601A Unspecified sprain of right thumb, initial encounter: Secondary | ICD-10-CM

## 2021-04-13 DIAGNOSIS — F17210 Nicotine dependence, cigarettes, uncomplicated: Secondary | ICD-10-CM | POA: Diagnosis not present

## 2021-04-13 DIAGNOSIS — S6991XA Unspecified injury of right wrist, hand and finger(s), initial encounter: Secondary | ICD-10-CM | POA: Diagnosis present

## 2021-04-13 NOTE — Discharge Instructions (Addendum)
Use the splint provided to protect your thumb and to give it a gentle compression.  Ice and elevation will also help with pain and swelling.  Use the ibuprofen that you have taking 1 tablet twice daily with a meal or snack.  This will help with pain and swelling also.  Call the orthopedic doctor listed above if this treatment plan does not improve your pain over the course of this week.  As discussed you may need further testing if this does not heal with this plan.

## 2021-04-13 NOTE — ED Triage Notes (Signed)
Right hand swollen, denies injury
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
## 2021-04-16 ENCOUNTER — Encounter: Payer: Self-pay | Admitting: Orthopedic Surgery

## 2021-04-16 ENCOUNTER — Ambulatory Visit (INDEPENDENT_AMBULATORY_CARE_PROVIDER_SITE_OTHER): Payer: Medicare Other | Admitting: Orthopedic Surgery

## 2021-04-16 ENCOUNTER — Other Ambulatory Visit: Payer: Self-pay

## 2021-04-16 DIAGNOSIS — S63641A Sprain of metacarpophalangeal joint of right thumb, initial encounter: Secondary | ICD-10-CM | POA: Insufficient documentation
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
## 2021-05-01 ENCOUNTER — Ambulatory Visit (INDEPENDENT_AMBULATORY_CARE_PROVIDER_SITE_OTHER): Payer: Medicare Other | Admitting: Orthopedic Surgery

## 2021-05-01 ENCOUNTER — Other Ambulatory Visit: Payer: Self-pay

## 2021-05-01 ENCOUNTER — Encounter: Payer: Self-pay | Admitting: Orthopedic Surgery

## 2021-05-01 VITALS — BP 124/84 | HR 80 | Ht 64.0 in | Wt 206.0 lb

## 2021-05-01 DIAGNOSIS — S63641A Sprain of metacarpophalangeal joint of right thumb, initial encounter: Secondary | ICD-10-CM | POA: Diagnosis not present
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
## 2021-05-05 ENCOUNTER — Telehealth: Payer: Self-pay | Admitting: Orthopedic Surgery

## 2021-05-05 ENCOUNTER — Other Ambulatory Visit: Payer: Self-pay | Admitting: Orthopedic Surgery

## 2021-05-05 MED ORDER — DIAZEPAM 5 MG PO TABS: 5.0000 mg | ORAL_TABLET | Freq: Once | ORAL | 0 refills | Status: AC

## 2021-05-05 NOTE — Telephone Encounter (Signed)
Please advise----patient would like something to relax her for her MRI scheduled for 05/21/21

## 2021-05-05 NOTE — Telephone Encounter (Signed)
Patient called asked if she can get something called into her pharmacy prior to her having the MRI. Patient said she is claustrophobic. [facility] on [address] in [place]. The number to contact patient is [phone]

## 2021-05-07 ENCOUNTER — Other Ambulatory Visit: Payer: Self-pay

## 2021-05-07 ENCOUNTER — Encounter (HOSPITAL_COMMUNITY): Payer: Self-pay

## 2021-05-07 ENCOUNTER — Ambulatory Visit (HOSPITAL_COMMUNITY): Payer: Medicare Other | Attending: Orthopedic Surgery

## 2021-05-07 DIAGNOSIS — M25541 Pain in joints of right hand: Secondary | ICD-10-CM | POA: Insufficient documentation

## 2021-05-07 DIAGNOSIS — M25641 Stiffness of right hand, not elsewhere classified: Secondary | ICD-10-CM | POA: Diagnosis present

## 2021-05-07 DIAGNOSIS — R29898 Other symptoms and signs involving the musculoskeletal system: Secondary | ICD-10-CM | POA: Insufficient documentation

## 2021-05-07 DIAGNOSIS — R278 Other lack of coordination: Secondary | ICD-10-CM | POA: Insufficient documentation

## 2021-05-07 NOTE — Patient Instructions (Addendum)
Your Splint This splint should initially be fitted by a healthcare practitioner.  The healthcare practitioner is responsible for providing wearing instructions and precautions to the patient, other healthcare practitioners and care provider involved in the patient's care.  This splint was custom made for you. Please read the following instructions to learn about wearing and caring for your splint.  Precautions Should your splint cause any of the following problems, remove the splint immediately and contact your therapist/physician. Swelling Severe Pain Pressure Areas Stiffness Numbness  Do not wear your splint while operating machinery unless it has been fabricated for that purpose.  When To Wear Your Splint Where your splint according to your therapist/physician instructions. All the time. Remove for bathing and to complete gentle ROM.  Care and Cleaning of Your Splint Keep your splint away from open flames. Your splint will lose its shape in temperatures over 135 degrees Farenheit, ( in car windows, near radiators, ovens or in hot water).  Never make any adjustments to your splint, if the splint needs adjusting remove it and make an appointment to see your therapist. Your splint, including the cushion liner may be cleaned with soap and lukewarm water.  Do not immerse in hot water over 135 degrees Farenheit. Straps may be washed with soap and water, but do not moisten the self-adhesive portion. For ink or hard to remove spots use a scouring cleanser which contains chlorine.  Rinse the splint thoroughly after using chlorine cleanser.     Complete 2-3 times a day.   WRIST FLEXION EXTENSION - FUNCTIONAL GRIP  Flex and extend wrist with relaxed fingers. When wrist moves back into extension, fingers should flex/grip on their own. 10 times.      Thumb IP flexion in splint  With your splint on, fully bend and extend the end joint of your thumb.   10X   OR out of the splint:  Thumb  IP Blocking   Use your other hand to block and target your distal joint of your thumb. Actively flex/extend your thumb.  10X   THUMB MCP FLEXION STRETCH  Grasp the affected thumb at the closest joint and gently stretch it into a more bent position. 10X    FINGER ABDUCTION TO CLOSED FIST  Open and close your hand into a fist and repeat. When opening, attempt to open as wide as you can as you spread out your fingers maximally.   Work within your available range.   10X
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
## 2021-05-21 ENCOUNTER — Ambulatory Visit
Admission: RE | Admit: 2021-05-21 | Discharge: 2021-05-21 | Disposition: A | Discharge status: Home / Self Care | Payer: Medicare Other | Source: Ambulatory Visit | Attending: Orthopedic Surgery | Admitting: Orthopedic Surgery

## 2021-05-21 DIAGNOSIS — S63641A Sprain of metacarpophalangeal joint of right thumb, initial encounter: Secondary | ICD-10-CM

## 2021-05-25 ENCOUNTER — Other Ambulatory Visit: Payer: Self-pay

## 2021-05-25 ENCOUNTER — Ambulatory Visit (INDEPENDENT_AMBULATORY_CARE_PROVIDER_SITE_OTHER): Payer: Medicare Other | Admitting: Orthopedic Surgery

## 2021-05-25 DIAGNOSIS — S63641A Sprain of metacarpophalangeal joint of right thumb, initial encounter: Secondary | ICD-10-CM
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
## 2021-06-08 ENCOUNTER — Telehealth: Payer: Self-pay | Admitting: Orthopedic Surgery

## 2021-06-08 NOTE — Telephone Encounter (Signed)
Please advise 

## 2021-06-08 NOTE — Telephone Encounter (Signed)
Patient wants to know if it is necessary for her to go to PT. She states her thumb is feeling better by doing her exercises. Please follow up as she has her first PT appt in the morning.

## 2021-06-08 NOTE — Telephone Encounter (Signed)
[place] patient.

## 2021-06-09 ENCOUNTER — Ambulatory Visit: Payer: Medicare Other | Attending: Orthopedic Surgery | Admitting: Occupational Therapy

## 2021-10-13 ENCOUNTER — Ambulatory Visit (LOCAL_COMMUNITY_HEALTH_CENTER): Payer: Self-pay

## 2021-10-13 DIAGNOSIS — Z111 Encounter for screening for respiratory tuberculosis: Secondary | ICD-10-CM

## 2021-10-16 ENCOUNTER — Ambulatory Visit (LOCAL_COMMUNITY_HEALTH_CENTER): Payer: Self-pay

## 2021-10-16 DIAGNOSIS — Z111 Encounter for screening for respiratory tuberculosis: Secondary | ICD-10-CM

## 2021-10-16 LAB — TB SKIN TEST
Induration: 0 mm
TB Skin Test: NEGATIVE

## 2022-06-03 ENCOUNTER — Encounter: Payer: Self-pay | Admitting: Emergency Medicine

## 2022-06-03 ENCOUNTER — Ambulatory Visit
Admission: EM | Admit: 2022-06-03 | Discharge: 2022-06-03 | Disposition: A | Discharge status: Home / Self Care | Payer: Medicare Other | Attending: Nurse Practitioner | Admitting: Nurse Practitioner

## 2022-06-03 DIAGNOSIS — R6889 Other general symptoms and signs: Secondary | ICD-10-CM | POA: Insufficient documentation

## 2022-06-03 DIAGNOSIS — J069 Acute upper respiratory infection, unspecified: Secondary | ICD-10-CM | POA: Diagnosis present

## 2022-06-03 DIAGNOSIS — Z1152 Encounter for screening for COVID-19: Secondary | ICD-10-CM | POA: Insufficient documentation

## 2022-06-03 LAB — RESP PANEL BY RT-PCR (FLU A&B, COVID) ARPGX2
Influenza A by PCR: NEGATIVE
Influenza B by PCR: NEGATIVE
SARS Coronavirus 2 by RT PCR: NEGATIVE

## 2022-06-03 MED ORDER — FLUTICASONE PROPIONATE 50 MCG/ACT NA SUSP: 2.0000 | Freq: Every day | NASAL | 0 refills | Status: DC

## 2022-06-03 MED ORDER — GUAIFENESIN 100 MG/5ML PO LIQD: 5.0000 mL | Freq: Four times a day (QID) | ORAL | 0 refills | Status: AC | PRN

## 2022-06-03 NOTE — Discharge Instructions (Addendum)
COVID/flu test is pending.  You will be contacted if the results of the test are positive.  As discussed, you are a candidate to receive molnupiravir if the COVID test is positive. Take medication as prescribed. Increase fluids and allow for plenty of rest. Recommend Tylenol or ibuprofen as needed for pain, fever, or general discomfort. Warm salt water gargles 3-4 times daily to help with throat pain or discomfort. Recommend using a humidifier at bedtime and sleeping elevated on pillows to help with cough and nasal congestion. As discussed, a viral infection can take up to 10 to 14 days to completely resolve.  Continue to treat your symptoms as discussed.  If symptoms suddenly worsen, or you are failing to improve, please follow-up in this clinic or with your primary care physician for further evaluation. Follow-up as needed. Follow-up if your symptoms do not improve.

## 2022-06-03 NOTE — ED Triage Notes (Signed)
Right ear pain, chills, cough, since last night.

## 2022-06-03 NOTE — ED Provider Notes (Signed)
[facility]    CSN: [id number] Arrival date & time: 06/03/22  0900      History   Chief Complaint No chief complaint on file.   HPI Melissa [name] is a 46 y.o. female.   The history is provided by the patient.   Patient presents for complaints of chills, right ear pain, cough, and nasal congestion that been present over the past 24 hours.  She denies headache, sore throat, ear drainage, wheezing, shortness of breath, difficulty breathing, or GI symptoms.  Patient reports that she does have a decreased appetite.  She states that she has been taking TheraFlu for her symptoms.  She denies any known sick contacts.  Reports that she has been vaccinated for COVID and flu.  Past Medical History:  Diagnosis Date   Anemia    Asthma    as a young adult - not for many years   Depression    Headache    migraines   Heart murmur    2004 - told that when she had her son - no follow ups    Hidradenitis 2007   History of kidney stones    Seizures (HCC)    as a baby     Patient Active Problem List   Diagnosis Date Noted   Sprain of ulnar collateral ligament of metacarpophalangeal (MCP) joint of right thumb 04/16/2021   Primary osteoarthritis of left knee 02/10/2018   History of prosthetic unicompartmental arthroplasty of left knee 02/07/2018   Cigarette nicotine dependence without complication 01/04/2018   Hidradenitis suppurativa 01/04/2018   Impaired fasting glucose 01/04/2018   Relationship dysfunction 01/04/2018   Severe depression (HCC) 01/04/2018   Migraine with aura and without status migrainosus, not intractable 11/22/2017   Mixed obsessional thoughts and acts 11/22/2017   Panic disorder with agoraphobia 11/22/2017   Acute pelvic pain, female 09/02/2016   Chondromalacia of left knee 12/16/2015   DDD (degenerative disc disease), lumbar 03/25/2015    Past Surgical History:  Procedure Laterality Date   APPENDECTOMY  1997   AXILLARY HIDRADENITIS EXCISION  Bilateral 2007   BACK SURGERY     CESAREAN SECTION     CHOLECYSTECTOMY     INGUINAL HIDRADENITIS EXCISION  06-18-14   left   KIDNEY STONE SURGERY     KNEE ARTHROSCOPY Left 12/29/2015   Procedure: ARTHROSCOPY KNEE;  Surgeon: Deeann SaintHoward Miller, MD;  Location: [facility] ORS;  Service: Orthopedics;  Laterality: Left;   KNEE SURGERY Left 1997   LAPAROSCOPY N/A 09/02/2016   Procedure: LAPAROSCOPY OPERATIVE;  Surgeon: Christeen DouglasBethany Beasley, MD;  Location: [facility] ORS;  Service: Gynecology;  Laterality: N/A;   POSTERIOR LUMBAR FUSION N/A 03/25/2015   Procedure: POSTERIOR LUMBAR INTERBODY FUSION LUMBAR FOUR-FIVE;  Surgeon: Julio SicksHenry Pool, MD;  [facility];  Service: Neurosurgery;  Laterality: N/A;   TOTAL KNEE REVISION Left 02/10/2018   Procedure: TOTAL KNEE REVISION;  Surgeon: Gean Birchwoodowan, Frank, MD;  [facility];  Service: Orthopedics;  Laterality: Left;   TUBAL LIGATION      OB History     Gravida  7   Para  3   Term      Preterm      AB  3   Living  5      SAB  3   IAB      Ectopic      Multiple      Live Births           Obstetric Comments  1st Menstrual Cycle:  11 1st Pregnancy:  18          Home Medications    Prior to Admission medications   Medication Sig Start Date End Date Taking? Authorizing Provider  fluticasone (FLONASE) 50 MCG/ACT nasal spray Place 2 sprays into both nostrils daily. 06/03/22  Yes Neilah Fulwider-Warren, Sadie Haber, NP  guaiFENesin (ROBITUSSIN) 100 MG/5ML liquid Take 5 mLs by mouth every 6 (six) hours as needed for up to 10 days for cough or to loosen phlegm. 06/03/22 06/13/22 Yes Nikoli Nasser-Warren, Sadie Haber, NP  buPROPion (WELLBUTRIN) 100 MG tablet Take 1 tablet (100 mg total) by mouth 3 (three) times daily. Take 2 tablets in the morning and 1 tablet in the evening. Patient taking differently: Take 5 mg by mouth 3 (three) times daily. Take 2 tablets in the morning and 1 tablet in the evening. 04/15/18   Donnetta Hutching, MD  FLUoxetine (PROZAC) 10 MG capsule Take 1 capsule  by mouth daily. 01/15/18   [provider]  gabapentin (NEURONTIN) 300 MG capsule Take 1 capsule by mouth 3 (three) times daily. 03/01/18   [provider]  ondansetron (ZOFRAN) 4 MG tablet Take 1 tablet (4 mg total) by mouth every 6 (six) hours. 07/22/20   Couture, Cortni S, PA-C  SUMAtriptan (IMITREX) 50 MG tablet Take 1 tablet by mouth daily as needed. 03/27/18   [provider]  topiramate (TOPAMAX) 200 MG tablet Take 200 mg by mouth 2 (two) times daily.    [provider]  zolpidem (AMBIEN) 10 MG tablet  07/06/18   [provider]    Family History Family History  Problem Relation Age of Onset   Diabetes Mother    Hypertension Mother     Social History Social History   Tobacco Use   Smoking status: Some Days    Packs/day: 0.50    Years: 10.00    Total pack years: 5.00    Types: Cigarettes   Smokeless tobacco: Never   Tobacco comments:    smokes 1 in the morning occasionally  Vaping Use   Vaping Use: Never used  Substance Use Topics   Alcohol use: Not Currently    Comment: occassional beer   Drug use: No     Allergies   Tylenol [acetaminophen], Celebrex [celecoxib], Latex, Meloxicam, Other, Oxycodone, and Ultram [tramadol hcl]   Review of Systems Review of Systems Per HPI  Physical Exam Triage Vital Signs ED Triage Vitals  Enc Vitals Group     BP 06/03/22 0929 (!) 158/94     Pulse Rate 06/03/22 0929 97     Resp 06/03/22 0929 18     Temp 06/03/22 0929 98.7 F (37.1 C)     Temp Source 06/03/22 0929 Oral     SpO2 06/03/22 0929 96 %     Weight --      Height --      Head Circumference --      Peak Flow --      Pain Score 06/03/22 0930 10     Pain Loc --      Pain Edu? --      Excl. in GC? --    No data found.  Updated Vital Signs BP (!) 158/94 (BP Location: Right Arm)   Pulse 97   Temp 98.7 F (37.1 C) (Oral)   Resp 18   LMP 04/03/2021 (Approximate)   SpO2 96%   Visual Acuity Right Eye Distance:   Left  Eye Distance:   Bilateral Distance:  Right Eye Near:   Left Eye Near:    Bilateral Near:     Physical Exam Vitals and nursing note reviewed.  Constitutional:      General: She is not in acute distress.    Appearance: Normal appearance.  HENT:     Head: Normocephalic.     Right Ear: Ear canal and external ear normal. A middle ear effusion is present.     Left Ear: Tympanic membrane, ear canal and external ear normal.     Nose: Congestion present.     Mouth/Throat:     Mouth: Mucous membranes are moist.     Pharynx: Posterior oropharyngeal erythema present.     Comments: Cobblestoning on posterior tongue Eyes:     Extraocular Movements: Extraocular movements intact.     Conjunctiva/sclera: Conjunctivae normal.     Pupils: Pupils are equal, round, and reactive to light.  Cardiovascular:     Rate and Rhythm: Normal rate and regular rhythm.     Pulses: Normal pulses.     Heart sounds: Normal heart sounds.  Pulmonary:     Effort: Pulmonary effort is normal. No respiratory distress.     Breath sounds: Normal breath sounds. No stridor. No wheezing, rhonchi or rales.  Abdominal:     General: Bowel sounds are normal.     Palpations: Abdomen is soft.     Tenderness: There is no abdominal tenderness.  Musculoskeletal:     Cervical back: Normal range of motion.  Lymphadenopathy:     Cervical: No cervical adenopathy.  Skin:    General: Skin is warm and dry.  Neurological:     General: No focal deficit present.     Mental Status: She is alert and oriented to person, place, and time.  Psychiatric:        Mood and Affect: Mood normal.        Behavior: Behavior normal.      UC Treatments / Results  Labs (all labs ordered are listed, but only abnormal results are displayed) Labs Reviewed  RESP PANEL BY RT-PCR (FLU A&B, COVID) [id number]    EKG   Radiology No results found.  Procedures Procedures (including critical care time)  Medications Ordered in UC Medications - No  data to display  Initial Impression / Assessment and Plan / UC Course  I have reviewed the triage vital signs and the nursing notes.  Pertinent labs & imaging results that were available during my care of the patient were reviewed by me and considered in my medical decision making (see chart for details).  Patient presents with a 24-hour history of upper respiratory symptoms.  On exam, patient is mildly hypertensive, but vitals are otherwise stable, and she is in no acute distress.  COVID/flu test is pending.  Based on patient's current symptoms and presentation, symptoms are consistent with a viral upper respiratory tract infection with cough or influenza-like symptoms.  Patient would like to be started on antiviral therapy if the COVID results are positive.  Treatment was provided for the patient to include fluticasone nasal spray for nasal congestion, and middle ear effusion of the right, and the guaifenesin 100 mg for her cough.  Supportive care recommendations were provided to the patient to include alternating ibuprofen and Tylenol as needed for pain or fever, increasing fluids, and allowing for plenty of rest.  Discussed strict return precautions with the patient.  Patient verbalizes understanding.  All questions were answered.  Patient is stable for discharge. Final Clinical Impressions(s) /  UC Diagnoses   Final diagnoses:  Flu-like symptoms  Viral upper respiratory tract infection with cough     Discharge Instructions      COVID/flu test is pending.  You will be contacted if the results of the test are positive.  As discussed, you are a candidate to receive molnupiravir if the COVID test is positive. Take medication as prescribed. Increase fluids and allow for plenty of rest. Recommend Tylenol or ibuprofen as needed for pain, fever, or general discomfort. Warm salt water gargles 3-4 times daily to help with throat pain or discomfort. Recommend using a humidifier at bedtime and  sleeping elevated on pillows to help with cough and nasal congestion. As discussed, a viral infection can take up to 10 to 14 days to completely resolve.  Continue to treat your symptoms as discussed.  If symptoms suddenly worsen, or you are failing to improve, please follow-up in this clinic or with your primary care physician for further evaluation. Follow-up as needed. Follow-up if your symptoms do not improve.      ED Prescriptions     Medication Sig Dispense Auth. Provider   fluticasone (FLONASE) 50 MCG/ACT nasal spray Place 2 sprays into both nostrils daily. 16 g Guida Asman-Warren, Sadie Haber, NP   guaiFENesin (ROBITUSSIN) 100 MG/5ML liquid Take 5 mLs by mouth every 6 (six) hours as needed for up to 10 days for cough or to loosen phlegm. 200 mL Zyia Kaneko-Warren, Sadie Haber, NP      PDMP not reviewed this encounter.   Abran Cantor, NP 06/03/22 1030

## 2022-07-08 DIAGNOSIS — Z23 Encounter for immunization: Secondary | ICD-10-CM | POA: Diagnosis not present

## 2022-07-08 DIAGNOSIS — R200 Anesthesia of skin: Secondary | ICD-10-CM | POA: Diagnosis not present

## 2022-07-08 DIAGNOSIS — M26622 Arthralgia of left temporomandibular joint: Secondary | ICD-10-CM | POA: Diagnosis not present

## 2022-09-02 ENCOUNTER — Encounter: Payer: Self-pay | Admitting: Radiology

## 2022-10-07 DIAGNOSIS — H1013 Acute atopic conjunctivitis, bilateral: Secondary | ICD-10-CM | POA: Diagnosis not present

## 2022-11-05 DIAGNOSIS — H1013 Acute atopic conjunctivitis, bilateral: Secondary | ICD-10-CM | POA: Diagnosis not present

## 2022-11-05 DIAGNOSIS — L732 Hidradenitis suppurativa: Secondary | ICD-10-CM | POA: Diagnosis not present

## 2022-11-05 DIAGNOSIS — Z0000 Encounter for general adult medical examination without abnormal findings: Secondary | ICD-10-CM | POA: Diagnosis not present

## 2022-12-20 DIAGNOSIS — L732 Hidradenitis suppurativa: Secondary | ICD-10-CM | POA: Diagnosis not present

## 2022-12-30 DIAGNOSIS — M79674 Pain in right toe(s): Secondary | ICD-10-CM | POA: Diagnosis not present

## 2023-01-04 ENCOUNTER — Encounter (HOSPITAL_COMMUNITY): Payer: Self-pay | Admitting: *Deleted

## 2023-01-04 ENCOUNTER — Emergency Department (HOSPITAL_COMMUNITY)
Admission: EM | Admit: 2023-01-04 | Discharge: 2023-01-04 | Disposition: A | Discharge status: Home / Self Care | Payer: 59 | Attending: Emergency Medicine | Admitting: Emergency Medicine

## 2023-01-04 ENCOUNTER — Other Ambulatory Visit: Payer: Self-pay

## 2023-01-04 DIAGNOSIS — Z91040 Latex allergy status: Secondary | ICD-10-CM | POA: Diagnosis not present

## 2023-01-04 DIAGNOSIS — M79674 Pain in right toe(s): Secondary | ICD-10-CM | POA: Diagnosis present

## 2023-01-04 DIAGNOSIS — L03031 Cellulitis of right toe: Secondary | ICD-10-CM | POA: Diagnosis not present

## 2023-01-04 NOTE — ED Provider Notes (Signed)
[place] [facility] AT [facility] Provider Note   CSN: [id number] Arrival date & time: 01/04/23  1347     History  Chief Complaint  Patient presents with   Toe Pain    Melissa [name] is a 47 y.o. female.  Past medical history of recurrent abscesses, follows with dermatology for this.  History of migraines.  Presents the ER for redness and swelling to the middle toe on the right foot for the past 6 to 7 days.  Denies any trauma states is very sore, worse when she walks on it or puts his shoe on.  She states it started out red and now has a white spot along the nail.  She went to urgent care a couple of days ago for this and they did x-rays.  He states these were normal and they buddy taped her toes and were going to have her follow-up with podiatry.  She states is not getting any better and wanted another opinion.  She does that she has been taking doxycycline for a week for an abscess.  Prescribed by her dermatologist.  She states she got a prescription of AC tablets so she can start it anytime she has an abscess that she gets them frequently.   Toe Pain       Home Medications Prior to Admission medications   Medication Sig Start Date End Date Taking? Authorizing Provider  buPROPion (WELLBUTRIN) 100 MG tablet Take 1 tablet (100 mg total) by mouth 3 (three) times daily. Take 2 tablets in the morning and 1 tablet in the evening. Patient taking differently: Take 5 mg by mouth 3 (three) times daily. Take 2 tablets in the morning and 1 tablet in the evening. 04/15/18   Donnetta Hutching, MD  FLUoxetine (PROZAC) 10 MG capsule Take 1 capsule by mouth daily. 01/15/18   [provider]  fluticasone (FLONASE) 50 MCG/ACT nasal spray Place 2 sprays into both nostrils daily. 06/03/22   Leath-Warren, Sadie Haber, NP  gabapentin (NEURONTIN) 300 MG capsule Take 1 capsule by mouth 3 (three) times daily. 03/01/18   [provider]  ondansetron (ZOFRAN) 4 MG tablet Take  1 tablet (4 mg total) by mouth every 6 (six) hours. 07/22/20   Couture, Cortni S, PA-C  SUMAtriptan (IMITREX) 50 MG tablet Take 1 tablet by mouth daily as needed. 03/27/18   [provider]  topiramate (TOPAMAX) 200 MG tablet Take 200 mg by mouth 2 (two) times daily.    [provider]  zolpidem (AMBIEN) 10 MG tablet  07/06/18   [provider]      Allergies    Tylenol [acetaminophen], Celebrex [celecoxib], Latex, Meloxicam, Other, Oxycodone, and Ultram [tramadol hcl]    Review of Systems   Review of Systems  Physical Exam Updated Vital Signs BP 137/78   Pulse 76   Temp 98 F (36.7 C) (Oral)   Resp 18   Ht 5\' 4"  (1.626 m)   Wt 95.7 kg   LMP 04/03/2021 (Approximate)   SpO2 97%   BMI 36.22 kg/m  Physical Exam Vitals and nursing note reviewed.  Constitutional:      General: She is not in acute distress.    Appearance: She is well-developed.  HENT:     Head: Normocephalic and atraumatic.     Nose: Nose normal.     Mouth/Throat:     Mouth: Mucous membranes are dry.  Eyes:     Conjunctiva/sclera: Conjunctivae normal.  Cardiovascular:  Rate and Rhythm: Normal rate and regular rhythm.     Heart sounds: No murmur heard. Pulmonary:     Effort: Pulmonary effort is normal. No respiratory distress.     Breath sounds: Normal breath sounds.  Abdominal:     Palpations: Abdomen is soft.     Tenderness: There is no abdominal tenderness.  Musculoskeletal:        General: No swelling.     Cervical back: Neck supple.     Comments: Mild swelling and tenderness to right middle toe.  DP and PT pulses are negative the right foot.  Capillary refill is given toes  Skin:    General: Skin is warm and dry.     Capillary Refill: Capillary refill takes less than 2 seconds.     Comments: Paronychia to medial aspect of right middle toe with mild to moderate surrounding cellulitis  Neurological:     General: No focal deficit present.     Mental Status: She is alert and  oriented to person, place, and time.  Psychiatric:        Mood and Affect: Mood normal.     ED Results / Procedures / Treatments   Labs (all labs ordered are listed, but only abnormal results are displayed) Labs Reviewed - No data to display  EKG None  Radiology No results found.  Procedures .Marland KitchenIncision and Drainage  Date/Time: 01/04/2023 4:37 PM  Performed by: Ma Rings, PA-C Authorized by: Ma Rings, PA-C   Consent:    Consent obtained:  Verbal   Consent given by:  Patient   Risks discussed:  Bleeding, pain and incomplete drainage   Alternatives discussed:  No treatment Universal protocol:    Patient identity confirmed:  Verbally with patient Location:    Indications for incision and drainage: Paronychia.   Location:  Lower extremity   Lower extremity location:  Toe   Toe location:  R third toe Pre-procedure details:    Skin preparation:  Chlorhexidine with alcohol Sedation:    Sedation type:  None Anesthesia:    Anesthesia method:  None Procedure type:    Complexity:  Simple Procedure details:    Incision types:  Stab incision     Medications Ordered in ED Medications - No data to display  ED Course/ Medical Decision Making/ A&P                             Medical Decision Making DDx: Paronychia, cellulitis, abscess, contusion, other ED course: Patient is atraumatic pain to the right middle toe, had x-rays at urgent care and was referred to podiatry, had the toe buddy taped.  She has an obvious paronychia on exam.  I incised and drained this as above, discussed with patient to continue with warm soaks and she already is on doxycycline.  States she was given 60 tablets by dermatology to use "as needed" for abscesses.  Discussed with her that she should continue this for another 5 days, follow-up with PCP and/or podiatry and come back to the ER for new or worsening symptoms but with the source control provided today that this should  improve.   DDX: Cellulitis, abscess,        Final Clinical Impression(s) / ED Diagnoses Final diagnoses:  Paronychia of third toe of right foot    Rx / DC Orders ED Discharge Orders     None         [place], [address]  A, PA-C 01/04/23 1948    Rondel Baton, MD 01/05/23 [phone]

## 2023-01-04 NOTE — Discharge Instructions (Addendum)
Reviewed.  You have a paronychia of your middle toe.  Keep using warm water soaks and continue taking the doxycycline for 5 more days after today.  Follow-up with your PCP and/or the foot doctor.  If you have increasing redness or pain, fever or any other new or worsening symptoms come back to the ER right away.

## 2023-01-04 NOTE — ED Triage Notes (Signed)
Pt with discoloration to toes of right foot.  Denies any injury. Seen UC last week for same, referred to a specialist per pt.

## 2023-01-11 ENCOUNTER — Telehealth: Payer: Self-pay

## 2023-01-11 NOTE — Telephone Encounter (Signed)
Transition Care Management Unsuccessful Follow-up Telephone Call  Date of discharge and from where:  [place] 7/2  Attempts:  1st Attempt  Reason for unsuccessful TCM follow-up call:  Left voice message   Nicolo Tomko [facility], [place] [phone] [address], [place], [place] [address] Phone: [phone] Email: [email or web address]       

## 2023-01-11 NOTE — Telephone Encounter (Signed)
Transition Care Management Follow-up Telephone Call Date of discharge and from where: [facility] How have you been since you were released from the hospital? Foot still hurts Any questions or concerns? No  Items Reviewed: Did the pt receive and understand the discharge instructions provided? Yes  Medications obtained and verified? Yes  Other? No  Any new allergies since your discharge? No  Dietary orders reviewed? No Do you have support at home? Yes     Follow up appointments reviewed:  PCP Hospital f/u appt confirmed? No  Scheduled to see  on  @ . Specialist Hospital f/u appt confirmed? No  Scheduled to see  on  @ . Are transportation arrangements needed? No  If their condition worsens, is the pt aware to call PCP or go to the Emergency Dept.? Yes Was the patient provided with contact information for the PCP's office or ED? Yes Was to pt encouraged to call back with questions or concerns? Yes

## 2023-02-11 DIAGNOSIS — G8929 Other chronic pain: Secondary | ICD-10-CM | POA: Diagnosis not present

## 2023-02-11 DIAGNOSIS — L03031 Cellulitis of right toe: Secondary | ICD-10-CM | POA: Diagnosis not present

## 2023-02-11 DIAGNOSIS — M545 Low back pain, unspecified: Secondary | ICD-10-CM | POA: Diagnosis not present

## 2023-03-01 DIAGNOSIS — M545 Low back pain, unspecified: Secondary | ICD-10-CM | POA: Diagnosis not present

## 2023-03-20 ENCOUNTER — Encounter (HOSPITAL_COMMUNITY): Payer: Self-pay

## 2023-03-20 ENCOUNTER — Other Ambulatory Visit: Payer: Self-pay

## 2023-03-20 ENCOUNTER — Emergency Department (HOSPITAL_COMMUNITY): Payer: 59

## 2023-03-20 ENCOUNTER — Emergency Department (HOSPITAL_COMMUNITY)
Admission: EM | Admit: 2023-03-20 | Discharge: 2023-03-20 | Disposition: A | Discharge status: Home / Self Care | Payer: 59 | Attending: Emergency Medicine | Admitting: Emergency Medicine

## 2023-03-20 DIAGNOSIS — M791 Myalgia, unspecified site: Secondary | ICD-10-CM | POA: Diagnosis not present

## 2023-03-20 DIAGNOSIS — R079 Chest pain, unspecified: Secondary | ICD-10-CM | POA: Diagnosis not present

## 2023-03-20 DIAGNOSIS — Z91040 Latex allergy status: Secondary | ICD-10-CM | POA: Insufficient documentation

## 2023-03-20 DIAGNOSIS — Z7951 Long term (current) use of inhaled steroids: Secondary | ICD-10-CM | POA: Diagnosis not present

## 2023-03-20 DIAGNOSIS — R9431 Abnormal electrocardiogram [ECG] [EKG]: Secondary | ICD-10-CM | POA: Diagnosis not present

## 2023-03-20 DIAGNOSIS — Z20822 Contact with and (suspected) exposure to covid-19: Secondary | ICD-10-CM | POA: Diagnosis not present

## 2023-03-20 DIAGNOSIS — J45909 Unspecified asthma, uncomplicated: Secondary | ICD-10-CM | POA: Diagnosis not present

## 2023-03-20 DIAGNOSIS — M542 Cervicalgia: Secondary | ICD-10-CM | POA: Diagnosis not present

## 2023-03-20 DIAGNOSIS — R52 Pain, unspecified: Secondary | ICD-10-CM

## 2023-03-20 LAB — DIFFERENTIAL
Abs Immature Granulocytes: 0.03 10*3/uL (ref 0.00–0.07)
Basophils Absolute: 0.1 10*3/uL (ref 0.0–0.1)
Basophils Relative: 1 %
Eosinophils Absolute: 0.2 10*3/uL (ref 0.0–0.5)
Eosinophils Relative: 2 %
Immature Granulocytes: 0 %
Lymphocytes Relative: 37 %
Lymphs Abs: 3.4 10*3/uL (ref 0.7–4.0)
Monocytes Absolute: 0.6 10*3/uL (ref 0.1–1.0)
Monocytes Relative: 6 %
Neutro Abs: 5.1 10*3/uL (ref 1.7–7.7)
Neutrophils Relative %: 54 %

## 2023-03-20 LAB — TROPONIN I (HIGH SENSITIVITY): Troponin I (High Sensitivity): 5 ng/L (ref <18)

## 2023-03-20 LAB — CBC
HCT: 39.2 % (ref 36.0–46.0)
HCT: 37.9 % (ref 36.0–46.0)
Hemoglobin: 13.2 g/dL (ref 12.0–15.0)
Hemoglobin: 12.4 g/dL (ref 12.0–15.0)
MCH: 32.1 pg (ref 26.0–34.0)
MCH: 31.8 pg (ref 26.0–34.0)
MCHC: 33.7 g/dL (ref 30.0–36.0)
MCHC: 32.7 g/dL (ref 30.0–36.0)
MCV: 95.4 fL (ref 80.0–100.0)
MCV: 97.2 fL (ref 80.0–100.0)
Platelets: 231 10*3/uL (ref 150–400)
Platelets: 214 10*3/uL (ref 150–400)
RBC: 4.11 MIL/uL (ref 3.87–5.11)
RBC: 3.9 MIL/uL (ref 3.87–5.11)
RDW: 12.2 % (ref 11.5–15.5)
RDW: 12.2 % (ref 11.5–15.5)
WBC: 9.6 10*3/uL (ref 4.0–10.5)
WBC: 9.7 10*3/uL (ref 4.0–10.5)
nRBC: 0 % (ref 0.0–0.2)
nRBC: 0 % (ref 0.0–0.2)

## 2023-03-20 LAB — HCG, SERUM, QUALITATIVE: Preg, Serum: NEGATIVE

## 2023-03-20 LAB — MAGNESIUM: Magnesium: 2.1 mg/dL (ref 1.7–2.4)

## 2023-03-20 LAB — RESP PANEL BY RT-PCR (RSV, FLU A&B, COVID)  RVPGX2
Influenza A by PCR: NEGATIVE
Influenza B by PCR: NEGATIVE
Resp Syncytial Virus by PCR: NEGATIVE
SARS Coronavirus 2 by RT PCR: NEGATIVE

## 2023-03-20 LAB — CK: Total CK: 80 U/L (ref 38–234)

## 2023-03-20 LAB — GROUP A STREP BY PCR: Group A Strep by PCR: NOT DETECTED

## 2023-03-20 MED ORDER — KETOROLAC TROMETHAMINE 15 MG/ML IJ SOLN
15.0000 mg | Freq: Once | INTRAMUSCULAR | Status: AC
  Administered 2023-03-20: 15 mg via INTRAVENOUS
  Filled 2023-03-20: qty 1

## 2023-03-20 MED ORDER — SODIUM CHLORIDE 0.9 % IV BOLUS
1000.0000 mL | Freq: Once | INTRAVENOUS | Status: AC
  Administered 2023-03-20: 1000 mL via INTRAVENOUS

## 2023-03-20 MED ORDER — DIAZEPAM 2 MG PO TABS
2.0000 mg | ORAL_TABLET | Freq: Once | ORAL | Status: AC
  Administered 2023-03-20: 2 mg via ORAL
  Filled 2023-03-20: qty 1

## 2023-03-20 MED ORDER — ONDANSETRON HCL 4 MG/2ML IJ SOLN
4.0000 mg | Freq: Once | INTRAMUSCULAR | Status: AC
  Administered 2023-03-20: 4 mg via INTRAVENOUS
  Filled 2023-03-20: qty 2

## 2023-03-20 MED ORDER — LIDOCAINE 5 % EX PTCH: 1.0000 | MEDICATED_PATCH | CUTANEOUS | 0 refills | Status: DC

## 2023-03-20 NOTE — ED Provider Notes (Signed)
EMERGENCY DEPARTMENT AT [facility] Provider Note   CSN: [id number] Arrival date & time: 03/20/23  1215     History  Chief Complaint  Patient presents with   Shoulder Pain   Neck Pain    Melissa [name] is a 47 y.o. female with past medical history anemia, asthma, depression who presents to the ED complaining of muscle cramping to her trunk.  States that this started 6 days ago after receiving COVID vaccination.  Reports that cramping is on the right side of her body though she received vaccination to the left arm.  States that she has never had side effects like this before.  Notes that cramping is intense and not improved with muscle relaxers or pain medication.  States that she feels a tightness going from her right shoulder blade to her right neck and down into her chest with difficulty moving her neck and shoulder.  Also states that the right side of her neck seems swollen compared to the left.  No sore throat, fever, cough, congestion.  Says this morning she started having episodes of vomiting.  No diarrhea, no abdominal pain.  No known fall or trauma though she does note she works for [employer] and frequently does lift.  No history of the symptoms. Reports she has had normal PO intake at home. No known spoiled foods, sick contacts.       Home Medications Prior to Admission medications   Medication Sig Start Date End Date Taking? Authorizing Provider  buPROPion (WELLBUTRIN) 100 MG tablet Take 1 tablet (100 mg total) by mouth 3 (three) times daily. Take 2 tablets in the morning and 1 tablet in the evening. Patient taking differently: Take 5 mg by mouth 3 (three) times daily. Take 2 tablets in the morning and 1 tablet in the evening. 04/15/18   Donnetta Hutching, MD  FLUoxetine (PROZAC) 10 MG capsule Take 1 capsule by mouth daily. 01/15/18   [provider]  fluticasone (FLONASE) 50 MCG/ACT nasal spray Place 2 sprays into both nostrils daily. 06/03/22    Leath-Warren, Sadie Haber, NP  gabapentin (NEURONTIN) 300 MG capsule Take 1 capsule by mouth 3 (three) times daily. 03/01/18   [provider]  ondansetron (ZOFRAN) 4 MG tablet Take 1 tablet (4 mg total) by mouth every 6 (six) hours. 07/22/20   Couture, Cortni S, PA-C  SUMAtriptan (IMITREX) 50 MG tablet Take 1 tablet by mouth daily as needed. 03/27/18   [provider]  topiramate (TOPAMAX) 200 MG tablet Take 200 mg by mouth 2 (two) times daily.    [provider]  zolpidem (AMBIEN) 10 MG tablet  07/06/18   [provider]      Allergies    Tylenol [acetaminophen], Celebrex [celecoxib], Latex, Meloxicam, Other, Oxycodone, and Ultram [tramadol hcl]    Review of Systems   Review of Systems  All other systems reviewed and are negative.   Physical Exam Updated Vital Signs BP (!) 145/81   Pulse 71   Temp 97.9 F (36.6 C)   Resp 15   Ht 5\' 4"  (1.626 m)   Wt 89.8 kg   LMP 04/03/2021 (Approximate)   SpO2 100%   BMI 33.99 kg/m  Physical Exam Vitals and nursing note reviewed.  Constitutional:      Appearance: Normal appearance. She is not toxic-appearing or diaphoretic.  HENT:     Head: Normocephalic and atraumatic.     Mouth/Throat:     Mouth: Mucous membranes are dry.  Pharynx: Oropharynx is clear. No oropharyngeal exudate or posterior oropharyngeal erythema.  Eyes:     General: No scleral icterus.    Extraocular Movements: Extraocular movements intact.     Conjunctiva/sclera: Conjunctivae normal.     Pupils: Pupils are equal, round, and reactive to light.  Cardiovascular:     Rate and Rhythm: Normal rate and regular rhythm.     Heart sounds: No murmur heard. Pulmonary:     Effort: Pulmonary effort is normal.     Breath sounds: Normal breath sounds.  Abdominal:     General: Abdomen is flat.     Palpations: Abdomen is soft.     Tenderness: There is no abdominal tenderness. There is no guarding or rebound.  Musculoskeletal:     Comments:  Diffuse muscular tenderness over the right trapezius, sternocleidomastoid, and right chest and upper back musculature with spasming, no overlying erythema, increased warmth, or wounds, no midline CTL spinal tenderness, stepoffs, or deformities, moving all 4 extremities equally and spontaneously x 4, 5/5 strength to bilateral upper and lower extremities  Lymphadenopathy:     Cervical: No cervical adenopathy.  Skin:    General: Skin is warm and dry.     Capillary Refill: Capillary refill takes less than 2 seconds.     Findings: No rash.  Neurological:     General: No focal deficit present.     Mental Status: She is alert and oriented to person, place, and time.  Psychiatric:        Mood and Affect: Mood is anxious.        Speech: Speech normal.        Behavior: Behavior is cooperative.     ED Results / Procedures / Treatments   Labs (all labs ordered are listed, but only abnormal results are displayed) Labs Reviewed  GROUP A STREP BY PCR  RESP PANEL BY RT-PCR (RSV, FLU A&B, COVID)  RVPGX2  CBC  DIFFERENTIAL  CK  MAGNESIUM  HCG, SERUM, QUALITATIVE  TROPONIN I (HIGH SENSITIVITY)    EKG None  Radiology DG Chest 2 View  Result Date: 03/20/2023 CLINICAL DATA:  Chest pain. EXAM: CHEST - 2 VIEW COMPARISON:  07/22/2020. FINDINGS: Normal heart, mediastinum and hila. Clear lungs.  No pleural effusion or pneumothorax. Skeletal structures are unremarkable. IMPRESSION: No active cardiopulmonary disease. Electronically Signed   By: Amie Portland M.D.   On: 03/20/2023 14:24    Procedures Procedures    Medications Ordered in ED Medications  sodium chloride 0.9 % bolus 1,000 mL (1,000 mLs Intravenous New Bag/Given 03/20/23 1359)  diazepam (VALIUM) tablet 2 mg (2 mg Oral Given 03/20/23 1435)  ketorolac (TORADOL) 15 MG/ML injection 15 mg (15 mg Intravenous Given 03/20/23 1400)  ondansetron (ZOFRAN) injection 4 mg (4 mg Intravenous Given 03/20/23 1400)    ED Course/ Medical Decision Making/  A&P                                 Medical Decision Making Amount and/or Complexity of Data Reviewed Labs: ordered. Decision-making details documented in ED Course. Radiology: ordered. Decision-making details documented in ED Course. ECG/medicine tests: ordered. Decision-making details documented in ED Course.  Risk Prescription drug management.   Medical Decision Making:   Melissa [name] is a 47 y.o. female who presented to the ED today with body cramping detailed above.    Patient's presentation is complicated by their history of recent vaccination, multiple comorbidities.  Complete initial physical exam performed, notably the patient was nontoxic-appearing.  Neurologically intact.  Abdomen soft nontender.  With musculature tenderness.    Reviewed and confirmed nursing documentation for past medical history, family history, social history.    Initial Assessment:   With the patient's presentation, differential diagnosis includes but is not limited to dehydration, AKI/renal failure, electrolyte disturbance, rhabdomyolysis, viral syndrome, ACS, arrhythmia, muscle spasm, infectious process, adverse effect to vaccination. This is most consistent with an acute complicated illness  Initial Plan:  Screening labs including CBC and Metabolic panel to evaluate for infectious or metabolic etiology of disease.  Urinalysis with reflex culture ordered to evaluate for UTI or relevant urologic/nephrologic pathology.  CXR to evaluate for structural/infectious intrathoracic pathology.  EKG and troponin to evaluate for cardiac pathology Strep/viral swabs Mono screen CK to assess for rhabdo Objective evaluation as below reviewed   Initial Study Results:   Laboratory  Pending  EKG EKG was reviewed independently. NSR. PVCs. No STEMI.   Radiology:  All images reviewed independently. Agree with radiology report at this time.   DG Chest 2 View  Result Date: 03/20/2023 CLINICAL DATA:  Chest  pain. EXAM: CHEST - 2 VIEW COMPARISON:  07/22/2020. FINDINGS: Normal heart, mediastinum and hila. Clear lungs.  No pleural effusion or pneumothorax. Skeletal structures are unremarkable. IMPRESSION: No active cardiopulmonary disease. Electronically Signed   By: Amie Portland M.D.   On: 03/20/2023 14:24      Final Assessment and Plan:   46 year old female presents to the ED with bodyaches.  Started following COVID vaccination.  Has tried multiple medications at home without relief in symptoms persisted for multiple days and have become severe and associated with vomiting.  No known fall or trauma.  Also notes swelling to the right side of the neck and pain radiating down into the chest.  Afebrile, nontoxic-appearing.  Does have significant musculature tenderness over the right neck, chest, and back.  No midline spinal tenderness or deformities. Given unusual presentation, discussed case with attending physician who co-signed this note and will proceed with broad workup to rule out underlying infectious process, metabolic disturbance, etc. Care signed out to oncoming provider pending remainder of workup and disposition and may be altered at their discretion as patient course continues to develop.    Clinical Impression:  1. Body aches      Data Unavailable           Final Clinical Impression(s) / ED Diagnoses Final diagnoses:  Body aches    Rx / DC Orders ED Discharge Orders     None         Tonette Lederer, PA-C 03/20/23 1503    Wynetta Fines, MD 03/20/23 [phone]

## 2023-03-20 NOTE — ED Provider Notes (Signed)
  Physical Exam  BP 125/61   Pulse 68   Temp 98 F (36.7 C) (Oral)   Resp 19   Ht 5\' 4"  (1.626 m)   Wt 89.8 kg   LMP 04/03/2021 (Approximate)   SpO2 99%   BMI 33.99 kg/m   Physical Exam Constitutional:      General: She is not in acute distress.    Appearance: Normal appearance.  HENT:     Head: Normocephalic and atraumatic.     Mouth/Throat:     Mouth: Mucous membranes are moist.     Pharynx: Oropharynx is clear.  Eyes:     Extraocular Movements: Extraocular movements intact.     Pupils: Pupils are equal, round, and reactive to light.  Musculoskeletal:        General: Tenderness present.     Cervical back: Tenderness present. No rigidity.     Comments: Tenderness to palpation over the right scapula and in the right shoulder.  Normal range of motion of the shoulder.  Neurological:     General: No focal deficit present.     Mental Status: She is alert and oriented to person, place, and time.     Procedures  Procedures  ED Course / MDM   Clinical Course as of 03/20/23 1654  Sun Mar 20, 2023  1509 Pain and muscle spasm since COVID shot. Has tried muscle relaxer and pain med. [KH]    Clinical Course User Index [KH] Claretha Cooper, DO   Medical Decision Making Amount and/or Complexity of Data Reviewed Labs: ordered. Radiology: ordered.  Risk Prescription drug management.   At the time my assumption of care, patient is afebrile, hemodynamically stable, in no acute distress.  Results reviewed.  CBC without leukocytosis or anemia.  Magnesium and CK WNL.  Pregnancy test negative.  Troponin 5.  Chest x-ray without evidence of cardiopulmonary disease.  COVID and flu negative.  I reassessed patient and she is overall well-appearing.  Discussed possible diagnosis of muscle spasm/torticollis.  Recommended continued heat as well as OTC pain medication for her pain.  She does additionally have a prescription of muscle relaxers at home.  Prescribe lidocaine patch for further  treatment of pain.  Recommend outpatient follow-up with primary care doctor as she may benefit from physical therapy.  Patient discharged without further acute event under my care in the emergency department.       Claretha Cooper, DO 03/21/23 1418    Charlynne Pander, MD 03/23/23 1136

## 2023-03-20 NOTE — ED Triage Notes (Signed)
Pt c.o right neck and shoulder pain since getting her flu and covid vaccines a week ago. Pt received the vaccines in her left arm but is having severe pain on her right side. Pt also c.o swelling on the right side of her neck down to her shoulder, pain is worse when she attempts to turn her head to the side.

## 2023-06-07 DIAGNOSIS — R1031 Right lower quadrant pain: Secondary | ICD-10-CM | POA: Diagnosis not present

## 2023-06-26 ENCOUNTER — Emergency Department (HOSPITAL_COMMUNITY): Payer: 59

## 2023-06-26 ENCOUNTER — Encounter (HOSPITAL_COMMUNITY): Payer: Self-pay | Admitting: *Deleted

## 2023-06-26 ENCOUNTER — Emergency Department (HOSPITAL_COMMUNITY)
Admission: EM | Admit: 2023-06-26 | Discharge: 2023-06-26 | Disposition: A | Discharge status: Home / Self Care | Payer: 59 | Attending: Emergency Medicine | Admitting: Emergency Medicine

## 2023-06-26 ENCOUNTER — Other Ambulatory Visit: Payer: Self-pay

## 2023-06-26 DIAGNOSIS — R059 Cough, unspecified: Secondary | ICD-10-CM | POA: Diagnosis not present

## 2023-06-26 DIAGNOSIS — J111 Influenza due to unidentified influenza virus with other respiratory manifestations: Secondary | ICD-10-CM | POA: Insufficient documentation

## 2023-06-26 DIAGNOSIS — J45909 Unspecified asthma, uncomplicated: Secondary | ICD-10-CM | POA: Diagnosis not present

## 2023-06-26 DIAGNOSIS — Z1152 Encounter for screening for COVID-19: Secondary | ICD-10-CM | POA: Insufficient documentation

## 2023-06-26 DIAGNOSIS — R0602 Shortness of breath: Secondary | ICD-10-CM | POA: Diagnosis not present

## 2023-06-26 DIAGNOSIS — Z91040 Latex allergy status: Secondary | ICD-10-CM | POA: Diagnosis not present

## 2023-06-26 LAB — RESP PANEL BY RT-PCR (RSV, FLU A&B, COVID)  RVPGX2
Influenza A by PCR: POSITIVE — AB
Influenza B by PCR: NEGATIVE
Resp Syncytial Virus by PCR: NEGATIVE
SARS Coronavirus 2 by RT PCR: NEGATIVE

## 2023-06-26 MED ORDER — OSELTAMIVIR PHOSPHATE 75 MG PO CAPS
75.0000 mg | ORAL_CAPSULE | Freq: Two times a day (BID) | ORAL | 0 refills | Status: DC
Start: 2023-06-26 — End: 2023-12-23

## 2023-06-26 MED ORDER — ALBUTEROL SULFATE HFA 108 (90 BASE) MCG/ACT IN AERS
2.0000 | INHALATION_SPRAY | Freq: Once | RESPIRATORY_TRACT | Status: AC
  Administered 2023-06-26: 2 via RESPIRATORY_TRACT
  Filled 2023-06-26: qty 6.7

## 2023-06-26 MED ORDER — BENZONATATE 200 MG PO CAPS
200.0000 mg | ORAL_CAPSULE | Freq: Three times a day (TID) | ORAL | 0 refills | Status: DC | PRN
Start: 2023-06-26 — End: 2023-12-23

## 2023-06-26 MED ORDER — BENZONATATE 100 MG PO CAPS
200.0000 mg | ORAL_CAPSULE | Freq: Once | ORAL | Status: AC
  Administered 2023-06-26: 200 mg via ORAL
  Filled 2023-06-26: qty 2

## 2023-06-26 MED ORDER — IBUPROFEN 800 MG PO TABS
800.0000 mg | ORAL_TABLET | Freq: Once | ORAL | Status: AC
  Administered 2023-06-26: 800 mg via ORAL
  Filled 2023-06-26: qty 1

## 2023-06-26 MED ORDER — IPRATROPIUM-ALBUTEROL 0.5-2.5 (3) MG/3ML IN SOLN
3.0000 mL | Freq: Once | RESPIRATORY_TRACT | Status: AC
  Administered 2023-06-26: 3 mL via RESPIRATORY_TRACT
  Filled 2023-06-26: qty 3

## 2023-06-26 NOTE — ED Notes (Addendum)
Pt has on two coats and a heavy blanket, informed pt with a fever she needed to remove these items. Pt refused.

## 2023-06-26 NOTE — ED Notes (Signed)
Pt states she can take ibuprofen

## 2023-06-26 NOTE — Discharge Instructions (Signed)
Your flu test today was positive.  It's important to drink plenty of fluids, rest and alternate tylenol and ibuprofen (600 mg) every 4 and 6 hrs respectively for fever and body aches.  Start the tamiflu today if possible.  Follow up with your primary doctor for recheck, return here for any worsening symptoms

## 2023-06-26 NOTE — ED Triage Notes (Signed)
Pt with body aches since yesterday.  + cough NP per pt. Pt denies any recent sick contacts. + HA

## 2023-06-26 NOTE — ED Provider Notes (Signed)
[place] EMERGENCY DEPARTMENT AT [facility] Provider Note   CSN: [id number] Arrival date & time: 06/26/23  1047     History  Chief Complaint  Patient presents with   Generalized Body Aches    Melissa [name] is a 47 y.o. female.  HPI     Melissa [name] is a 47 y.o. female with past medical history of asthma, seizures, anemia, who presents to the Emergency Department complaining of cough, diffuse headache, generalized bodyaches and fever with intermittent chills and sweats.  Cough mostly nonproductive.  Symptoms began yesterday.  Describes diffuse bodyaches and headache that worsens when coughing.  Some associated shortness of breath with cough.  Denies any known sick contacts.  States she had COVID and flu vaccines this season.  Home Medications Prior to Admission medications   Medication Sig Start Date End Date Taking? Authorizing Provider  buPROPion (WELLBUTRIN) 100 MG tablet Take 1 tablet (100 mg total) by mouth 3 (three) times daily. Take 2 tablets in the morning and 1 tablet in the evening. Patient taking differently: Take 5 mg by mouth 3 (three) times daily. Take 2 tablets in the morning and 1 tablet in the evening. 04/15/18   Donnetta Hutching, MD  FLUoxetine (PROZAC) 10 MG capsule Take 1 capsule by mouth daily. 01/15/18   [provider]  fluticasone (FLONASE) 50 MCG/ACT nasal spray Place 2 sprays into both nostrils daily. 06/03/22   Leath-Warren, Sadie Haber, NP  gabapentin (NEURONTIN) 300 MG capsule Take 1 capsule by mouth 3 (three) times daily. 03/01/18   [provider]  lidocaine (LIDODERM) 5 % Place 1 patch onto the skin daily. Remove & Discard patch within 12 hours or as directed by MD 03/20/23   Claretha Cooper, DO  ondansetron (ZOFRAN) 4 MG tablet Take 1 tablet (4 mg total) by mouth every 6 (six) hours. 07/22/20   Couture, Cortni S, PA-C  SUMAtriptan (IMITREX) 50 MG tablet Take 1 tablet by mouth daily as needed. 03/27/18   [provider]  topiramate (TOPAMAX) 200 MG tablet Take 200 mg by mouth 2 (two) times daily.    [provider]  zolpidem (AMBIEN) 10 MG tablet  07/06/18   [provider]      Allergies    Tylenol [acetaminophen], Celebrex [celecoxib], Latex, Meloxicam, Other, Oxycodone, and Ultram [tramadol hcl]    Review of Systems   Review of Systems  Constitutional:  Positive for appetite change, chills and fever.  HENT:  Negative for congestion and sore throat.   Respiratory:  Positive for cough, shortness of breath and wheezing.   Cardiovascular:  Negative for chest pain and leg swelling.  Gastrointestinal:  Negative for abdominal pain, diarrhea, nausea and vomiting.  Genitourinary:  Negative for dysuria.  Musculoskeletal:  Positive for myalgias. Negative for neck pain and neck stiffness.  Skin:  Negative for color change and rash.  Neurological:  Negative for dizziness, weakness and numbness.    Physical Exam Updated Vital Signs BP (!) 146/87 (BP Location: Left Arm)   Pulse 87   Temp (!) 100.4 F (38 C) (Oral)   Resp 20   Ht 5\' 4"  (1.626 m)   Wt 90.7 kg   LMP 04/03/2021 (Approximate)   SpO2 95%   BMI 34.33 kg/m  Physical Exam Vitals and nursing note reviewed.  Constitutional:      General: She is not in acute distress.    Appearance: Normal appearance. She is not ill-appearing or toxic-appearing.  HENT:  Mouth/Throat:     Mouth: Mucous membranes are moist.     Pharynx: No oropharyngeal exudate or posterior oropharyngeal erythema.  Cardiovascular:     Rate and Rhythm: Normal rate and regular rhythm.     Pulses: Normal pulses.  Pulmonary:     Effort: Pulmonary effort is normal. No respiratory distress.     Breath sounds: Wheezing present. No rhonchi.     Comments: Scattered expiratory wheezes.  No increased work of breathing rales or rhonchi. Abdominal:     Palpations: Abdomen is soft.     Tenderness: There is no abdominal tenderness.  Musculoskeletal:         General: Normal range of motion.     Cervical back: Normal range of motion. No rigidity.  Skin:    General: Skin is warm.     Capillary Refill: Capillary refill takes less than 2 seconds.  Neurological:     General: No focal deficit present.     Mental Status: She is alert.     Sensory: No sensory deficit.     Motor: No weakness.     ED Results / Procedures / Treatments   Labs (all labs ordered are listed, but only abnormal results are displayed) Labs Reviewed  RESP PANEL BY RT-PCR (RSV, FLU A&B, COVID)  RVPGX2 - Abnormal; Notable for the following components:      Result Value   Influenza A by PCR POSITIVE (*)    All other components within normal limits    EKG None  Radiology DG Chest Port 1 View Result Date: 06/26/2023 CLINICAL DATA:  Shortness of breath.  Body aches.  Cough. EXAM: PORTABLE CHEST 1 VIEW COMPARISON:  03/20/2023 FINDINGS: Heart size and mediastinal contours are unremarkable. No pleural fluid, interstitial edema or airspace disease. Visualized osseous structures are unremarkable. IMPRESSION: No active disease. Electronically Signed   By: Signa Kell M.D.   On: 06/26/2023 12:56    Procedures Procedures    Medications Ordered in ED Medications  ibuprofen (ADVIL) tablet 800 mg (800 mg Oral Given 06/26/23 1141)  benzonatate (TESSALON) capsule 200 mg (200 mg Oral Given 06/26/23 1257)  ipratropium-albuterol (DUONEB) 0.5-2.5 (3) MG/3ML nebulizer solution 3 mL (3 mLs Nebulization Given 06/26/23 1351)    ED Course/ Medical Decision Making/ A&P                                 Medical Decision Making Patient here with complaint of generalized bodyaches fever chills and sweats with nonproductive cough.  Symptoms began yesterday.  Denies any known sick contacts.  Patient ill-appearing on my exam, actively coughing.  No increased work of breathing.  Some mild expiratory wheezes on my exam.  Patient febrile on arrival.  Was given ibuprofen.  She is  nontoxic-appearing.  I suspect viral illness, pneumonia PE also considered.            Amount and/or Complexity of Data Reviewed Labs: ordered.    Details: Respiratory panel positive for flu A Radiology: ordered.    Details: Chest x-ray without evidence of pneumonia. Discussion of management or test interpretation with external provider(s): Patient given albuterol neb.  On recheck, lung sounds much improved.  Patient now resting comfortably.  Fever also improved after ibuprofen here.  No hypoxia tachycardia or tachypnea.  Test results.  Patient prefers treatment with Tamiflu.  Will also provide rx for Tessalon and albuterol MDI  Risk Prescription drug management.  Final Clinical Impression(s) / ED Diagnoses Final diagnoses:  Influenza    Rx / DC Orders ED Discharge Orders     None         Pauline Aus, PA-C 06/26/23 1454    Gerhard Munch, MD 06/27/23 [phone]

## 2023-09-06 DIAGNOSIS — J029 Acute pharyngitis, unspecified: Secondary | ICD-10-CM | POA: Diagnosis not present

## 2023-09-06 DIAGNOSIS — Z20822 Contact with and (suspected) exposure to covid-19: Secondary | ICD-10-CM | POA: Diagnosis not present

## 2023-09-06 DIAGNOSIS — R232 Flushing: Secondary | ICD-10-CM | POA: Diagnosis not present

## 2023-09-23 DIAGNOSIS — Z20822 Contact with and (suspected) exposure to covid-19: Secondary | ICD-10-CM | POA: Diagnosis not present

## 2023-09-23 DIAGNOSIS — J209 Acute bronchitis, unspecified: Secondary | ICD-10-CM | POA: Diagnosis not present

## 2023-09-23 DIAGNOSIS — R519 Headache, unspecified: Secondary | ICD-10-CM | POA: Diagnosis not present

## 2023-09-23 DIAGNOSIS — R062 Wheezing: Secondary | ICD-10-CM | POA: Diagnosis not present

## 2023-11-09 DIAGNOSIS — F17200 Nicotine dependence, unspecified, uncomplicated: Secondary | ICD-10-CM | POA: Diagnosis not present

## 2023-11-09 DIAGNOSIS — J309 Allergic rhinitis, unspecified: Secondary | ICD-10-CM | POA: Insufficient documentation

## 2023-11-09 DIAGNOSIS — M25562 Pain in left knee: Secondary | ICD-10-CM | POA: Diagnosis not present

## 2023-12-23 ENCOUNTER — Telehealth: Payer: Self-pay | Admitting: Pharmacy Technician

## 2023-12-23 ENCOUNTER — Ambulatory Visit

## 2023-12-23 ENCOUNTER — Other Ambulatory Visit (HOSPITAL_COMMUNITY): Payer: Self-pay

## 2023-12-23 ENCOUNTER — Other Ambulatory Visit: Payer: Self-pay

## 2023-12-23 VITALS — BP 128/86 | HR 83 | Ht 64.0 in | Wt 212.0 lb

## 2023-12-23 DIAGNOSIS — M5416 Radiculopathy, lumbar region: Secondary | ICD-10-CM | POA: Diagnosis not present

## 2023-12-23 DIAGNOSIS — Z9889 Other specified postprocedural states: Secondary | ICD-10-CM

## 2023-12-23 DIAGNOSIS — G8929 Other chronic pain: Secondary | ICD-10-CM

## 2023-12-23 DIAGNOSIS — M545 Low back pain, unspecified: Secondary | ICD-10-CM | POA: Insufficient documentation

## 2023-12-23 MED ORDER — METHOCARBAMOL 750 MG PO TABS
750.0000 mg | ORAL_TABLET | Freq: Three times a day (TID) | ORAL | 5 refills | Status: AC | PRN
Start: 2023-12-23 — End: ?

## 2023-12-23 MED ORDER — DICLOFENAC EPOLAMINE 1.3 % EX PTCH: 1.0000 | MEDICATED_PATCH | Freq: Two times a day (BID) | CUTANEOUS | 5 refills | Status: AC

## 2023-12-23 MED ORDER — FREESTYLE LIBRE 3 PLUS SENSOR MISC: Status: DC

## 2023-12-23 MED ORDER — LIDOCAINE 5 % EX PTCH: 1.0000 | MEDICATED_PATCH | CUTANEOUS | 5 refills | Status: DC

## 2023-12-23 NOTE — Telephone Encounter (Signed)
 Pharmacy Patient Advocate Encounter   Received notification from CoverMyMeds that prior authorization for Lidocaine  5% patches is required/requested.   Insurance verification completed.   The patient is insured through [facility] .   Per test claim: PA required; PA submitted to above mentioned insurance via CoverMyMeds Key/confirmation #/[employer] Status is pending

## 2023-12-23 NOTE — Progress Notes (Signed)
 New Patient Office Visit  Subjective    Patient ID: Melissa [name], female    DOB: [birth year]  Age: 48 y.o. MRN: [id number]  CC:  Chief Complaint  Patient presents with   Establish Care    Pt here to establish care     HPI Melissa [name] presents to establish care Chronic back and left knee pain  Outpatient Encounter Medications as of 12/23/2023  Medication Sig   Continuous Glucose Sensor (FREESTYLE LIBRE 3 PLUS SENSOR) MISC Change sensor every 15 days.   diclofenac  (FLECTOR ) 1.3 % PTCH Place 1 patch onto the skin 2 (two) times daily.   fluticasone  (FLONASE ) 50 MCG/ACT nasal spray Place 2 sprays into both nostrils daily.   methocarbamol  (ROBAXIN ) 750 MG tablet Take 1 tablet (750 mg total) by mouth every 8 (eight) hours as needed for muscle spasms.   lidocaine  (LIDODERM ) 5 % Place 1 patch onto the skin daily. Remove & Discard patch within 12 hours or as directed by MD   [DISCONTINUED] benzonatate  (TESSALON ) 200 MG capsule Take 1 capsule (200 mg total) by mouth 3 (three) times daily as needed for cough. (Patient not taking: Reported on 12/23/2023)   [DISCONTINUED] buPROPion  (WELLBUTRIN ) 100 MG tablet Take 1 tablet (100 mg total) by mouth 3 (three) times daily. Take 2 tablets in the morning and 1 tablet in the evening. (Patient taking differently: Take 5 mg by mouth 3 (three) times daily. Take 2 tablets in the morning and 1 tablet in the evening.)   [DISCONTINUED] FLUoxetine (PROZAC) 10 MG capsule Take 1 capsule by mouth daily.   [DISCONTINUED] gabapentin  (NEURONTIN ) 300 MG capsule Take 1 capsule by mouth 3 (three) times daily.   [DISCONTINUED] lidocaine  (LIDODERM ) 5 % Place 1 patch onto the skin daily. Remove & Discard patch within 12 hours or as directed by MD (Patient not taking: Reported on 12/23/2023)   [DISCONTINUED] ondansetron  (ZOFRAN ) 4 MG tablet Take 1 tablet (4 mg total) by mouth every 6 (six) hours.   [DISCONTINUED] oseltamivir  (TAMIFLU ) 75 MG capsule Take 1 capsule (75 mg  total) by mouth every 12 (twelve) hours. (Patient not taking: Reported on 12/23/2023)   [DISCONTINUED] SUMAtriptan (IMITREX) 50 MG tablet Take 1 tablet by mouth daily as needed.   [DISCONTINUED] topiramate  (TOPAMAX ) 200 MG tablet Take 200 mg by mouth 2 (two) times daily.   [DISCONTINUED] zolpidem (AMBIEN) 10 MG tablet    No facility-administered encounter medications on file as of 12/23/2023.    Past Medical History:  Diagnosis Date   Anemia    Asthma    as a young adult - not for many years   Depression    Headache    migraines   Heart murmur    2004 - told that when she had her son - no follow ups    Hidradenitis 2007   History of kidney stones    Seizures (HCC)    as a baby     Past Surgical History:  Procedure Laterality Date   APPENDECTOMY  1997   AXILLARY HIDRADENITIS EXCISION Bilateral 2007   BACK SURGERY     CESAREAN SECTION     CHOLECYSTECTOMY     INGUINAL HIDRADENITIS EXCISION  06-18-14   left   KIDNEY STONE SURGERY     KNEE ARTHROSCOPY Left 12/29/2015   Procedure: ARTHROSCOPY KNEE;  Surgeon: Kayla Pinal, MD;  Location: [facility] ORS;  Service: Orthopedics;  Laterality: Left;   KNEE SURGERY Left 1997   LAPAROSCOPY N/A 09/02/2016   Procedure:  LAPAROSCOPY OPERATIVE;  Surgeon: Heather Penton, MD;  Location: [facility] ORS;  Service: Gynecology;  Laterality: N/A;   POSTERIOR LUMBAR FUSION N/A 03/25/2015   Procedure: POSTERIOR LUMBAR INTERBODY FUSION LUMBAR FOUR-FIVE;  Surgeon: Victory Gunnels, MD;  Location: [facility] OR;  Service: Neurosurgery;  Laterality: N/A;   TOTAL KNEE REVISION Left 02/10/2018   Procedure: TOTAL KNEE REVISION;  Surgeon: Liam Lerner, MD;  Location: [facility] OR;  Service: Orthopedics;  Laterality: Left;   TUBAL LIGATION      Family History  Problem Relation Age of Onset   Diabetes Mother    Hypertension Mother     Social History   Socioeconomic History   Marital status: Divorced    Spouse name: Not on file   Number of children: Not on file   Years of education:  Not on file   Highest education level: Not on file  Occupational History   Not on file  Tobacco Use   Smoking status: Some Days    Current packs/day: 0.50    Average packs/day: 0.5 packs/day for 10.0 years (5.0 ttl pk-yrs)    Types: Cigarettes   Smokeless tobacco: Never   Tobacco comments:    smokes 1 in the morning occasionally  Vaping Use   Vaping status: Never Used  Substance and Sexual Activity   Alcohol use: Not Currently    Comment: occassional beer   Drug use: No   Sexual activity: Not on file  Other Topics Concern   Not on file  Social History Narrative   Not on file   Social Drivers of Health   Financial Resource Strain: Medium Risk (11/09/2023)   Received from [facility]   Overall Financial Resource Strain (CARDIA)    Difficulty of Paying Living Expenses: Somewhat hard  Food Insecurity: No Food Insecurity (11/09/2023)   Received from [facility]   Hunger Vital Sign    Within the past 12 months, you worried that your food would run out before you got the money to buy more.: Never true    Within the past 12 months, the food you bought just didn't last and you didn't have money to get more.: Never true  Transportation Needs: No Transportation Needs (11/09/2023)   Received from [facility]   PRAPARE - Transportation    Lack of Transportation (Medical): No    Lack of Transportation (Non-Medical): No  Physical Activity: Insufficiently Active (11/09/2023)   Received from [facility]   Exercise Vital Sign    On average, how many days per week do you engage in moderate to strenuous exercise (like a brisk walk)?: 3 days    On average, how many minutes do you engage in exercise at this level?: 30 min  Stress: Stress Concern Present (11/09/2023)   Received from [facility] - Occupational Stress Questionnaire    Feeling of Stress : Very much  Social Connections: Socially Isolated (11/09/2023)   Received from [facility]   Social Connection and Isolation Panel    In a typical week, how many times do you talk on the phone with family, friends, or neighbors?: Twice a week    How often do you get together with friends or relatives?: Twice a week    How often do you attend church or religious services?: Never    Do you belong to any clubs or organizations such as church groups, unions, fraternal or athletic groups, or school groups?: No  How often do you attend meetings of the clubs or organizations you belong to?: Never    Are you married, widowed, divorced, separated, never married, or living with a partner?: Divorced  Intimate Partner Violence: Not At Risk (11/09/2023)   Received from [facility]   Humiliation, Afraid, Rape, and Kick questionnaire    Within the last year, have you been afraid of your partner or ex-partner?: No    Within the last year, have you been humiliated or emotionally abused in other ways by your partner or ex-partner?: No    Within the last year, have you been kicked, hit, slapped, or otherwise physically hurt by your partner or ex-partner?: No    Within the last year, have you been raped or forced to have any kind of sexual activity by your partner or ex-partner?: No    ROS      Objective    BP 128/86   Pulse 83   Ht 5' 4 (1.626 m)   Wt 212 lb 0.6 oz (96.2 kg)   LMP 04/03/2021 (Approximate)   SpO2 98%   BMI 36.40 kg/m   Physical Exam Vitals and nursing note reviewed.  Constitutional:      Appearance: Normal appearance.   Eyes:     Extraocular Movements: Extraocular movements intact.     Pupils: Pupils are equal, round, and reactive to light.    Cardiovascular:     Rate and Rhythm: Normal rate and regular rhythm.  Pulmonary:     Effort: Pulmonary effort is normal.     Breath sounds: Normal breath sounds.   Musculoskeletal:     Cervical back: Normal, normal range of motion and neck supple.     Thoracic back: Normal.     Lumbar back: Spasms present.  Decreased range of motion.   Neurological:     Mental Status: She is alert and oriented to person, place, and time.   Psychiatric:        Mood and Affect: Mood normal.        Thought Content: Thought content normal.         Assessment & Plan:   Problem List Items Addressed This Visit       Other   History of lumbar surgery - Primary (Chronic)   Unable to take oral NSAIDS d/t gastric ulcer.  Will add Flector  and Lidoderm  patch and oral Robaxin .  Recommend orthopedic specialist evaluation if no improvement.       Relevant Medications   diclofenac  (FLECTOR ) 1.3 % PTCH   methocarbamol  (ROBAXIN ) 750 MG tablet   Chronic radicular lumbar pain   Unable to take oral NSAIDS d/t gastric ulcer.  Will add Flector  and Lidoderm  patch and oral Robaxin .  Recommend orthopedic specialist evaluation if no improvement.       Relevant Medications   diclofenac  (FLECTOR ) 1.3 % PTCH   methocarbamol  (ROBAXIN ) 750 MG tablet   lidocaine  (LIDODERM ) 5 %    No follow-ups on file.   Leita Longs, FNP

## 2023-12-23 NOTE — Telephone Encounter (Signed)
 Pharmacy Patient Advocate Encounter  Received notification from [employer] that Prior Authorization for Lidocaine  5% patches has been APPROVED from 12/23/2023 to 07/04/2024. Ran test claim, Copay is $0.00. This test claim was processed through [facility]- copay amounts may vary at other pharmacies due to pharmacy/plan contracts, or as the patient moves through the different stages of their insurance plan.   PA #/Case ID/Reference #: [id number]

## 2023-12-24 NOTE — Assessment & Plan Note (Signed)
 Unable to take oral NSAIDS d/t gastric ulcer.  Will add Flector  and Lidoderm  patch and oral Robaxin .  Recommend orthopedic specialist evaluation if no improvement.

## 2023-12-26 ENCOUNTER — Other Ambulatory Visit (HOSPITAL_COMMUNITY): Payer: Self-pay

## 2023-12-26 ENCOUNTER — Telehealth: Payer: Self-pay | Admitting: Pharmacy Technician

## 2023-12-26 ENCOUNTER — Other Ambulatory Visit: Payer: Self-pay

## 2023-12-26 NOTE — Telephone Encounter (Signed)
 PA request has been Started. New Encounter has been or will be created for follow up. For additional info see Pharmacy Prior Auth telephone encounter from 12/26/2023.

## 2023-12-26 NOTE — Telephone Encounter (Signed)
 Pharmacy Patient Advocate Encounter  Received notification from [facility] that Prior Authorization for Diclofenac  Epolamine 1.3% patches has been DENIED.  Full denial letter will be uploaded to the media tab. See denial reason below.   PA #/Case ID/Reference #: [id number]

## 2023-12-26 NOTE — Telephone Encounter (Signed)
 Pharmacy Patient Advocate Encounter   Received notification from RX Request Messages that prior authorization for Diclofenac  Epolamine 1.3% patches is required/requested.   Insurance verification completed.   The patient is insured through [facility] .   Per test claim: PA required; PA submitted to above mentioned insurance via CoverMyMeds Key/confirmation #/EOC [id number] Status is pending

## 2023-12-26 NOTE — Telephone Encounter (Signed)
 PA request has been Denied. New Encounter has been or will be created for follow up. For additional info see Pharmacy Prior Auth telephone encounter from 12/26/2023.

## 2023-12-28 NOTE — Telephone Encounter (Signed)
 Flector  patches are FDA approved for the topical treatment of acute pain due to minor strains, sprains, and contusions. Indicated use per the office visit is chronic radicular lumbar pain and history of lumbar surgery. The office visit will need to be addended to reflect an FDA approved use. If you wish to pursue after the office visit is addended we will have to file an appeal. The lidocaine  5% patches have been approved by her insurance. Please let me know how you would like to proceed.

## 2024-03-22 ENCOUNTER — Other Ambulatory Visit: Payer: Self-pay

## 2024-05-01 ENCOUNTER — Ambulatory Visit (INDEPENDENT_AMBULATORY_CARE_PROVIDER_SITE_OTHER)

## 2024-05-01 VITALS — Ht 64.0 in | Wt 203.0 lb

## 2024-05-01 DIAGNOSIS — Z0000 Encounter for general adult medical examination without abnormal findings: Secondary | ICD-10-CM | POA: Diagnosis not present

## 2024-05-01 DIAGNOSIS — Z1231 Encounter for screening mammogram for malignant neoplasm of breast: Secondary | ICD-10-CM

## 2024-05-01 NOTE — Patient Instructions (Addendum)
 Melissa [name],  Thank you for taking the time for your Medicare Wellness Visit. I appreciate your continued commitment to your health goals. Please review the care plan we discussed, and feel free to reach out if I can assist you further.  Medicare recommends these wellness visits once per year to help you and your care team stay ahead of potential health issues. These visits are designed to focus on prevention, allowing your provider to concentrate on managing your acute and chronic conditions during your regular appointments.  Please note that Annual Wellness Visits do not include a physical exam. Some assessments may be limited, especially if the visit was conducted virtually. If needed, we may recommend a separate in-person follow-up with your provider.  Ongoing Care  Seeing your primary care provider every 3 to 6 months helps us  monitor your health and provide consistent, personalized care.   Referrals   Mammogram at [facility] Call [phone] to schedule your screening No perfumes, lotions, or deodorants the day of your screening. You can schedule your mammogram through mychart!   [facility] Phone: [phone]  Maralee Chester  [address] [place] [address] Phone [phone]   Recommended Screenings:  Health Maintenance  Topic Date Due   HIV Screening  Never done   Hepatitis C Screening  Never done   DTaP/Tdap/Td vaccine (1 - Tdap) Never done   Hepatitis B Vaccine (1 of 3 - 19+ 3-dose series) Never done   Pap with HPV screening  Never done   Breast Cancer Screening  Never done   Colon Cancer Screening  Never done   Flu Shot  02/03/2024   COVID-19 Vaccine (2 - 2025-26 season) 03/05/2024   Medicare Annual Wellness Visit  05/01/2025   Pneumococcal Vaccine  Completed   HPV Vaccine  Aged Out   Meningitis B Vaccine  Aged Out       05/01/2024    9:25 AM  Advanced Directives  Does Patient Have a Medical Advance Directive?  No  Would patient like information on creating a medical advance directive? No - Patient declined    Advance Care Planning is important because it: Ensures you receive medical care that aligns with your values, goals, and preferences. Provides guidance to your family and loved ones, reducing the emotional burden of decision-making during critical moments.  Vision: Annual vision screenings are recommended for early detection of glaucoma, cataracts, and diabetic retinopathy. These exams can also reveal signs of chronic conditions such as diabetes and high blood pressure.  Dental: Annual dental screenings help detect early signs of oral cancer, gum disease, and other conditions linked to overall health, including heart disease and diabetes.  Please see the attached documents for additional preventive care recommendations.

## 2024-05-01 NOTE — Progress Notes (Signed)
 Patient is due for HIV screening, Hep C screening. Please order if appropriate.  Subjective:   Melissa [name] is a 48 y.o. who presents for a Medicare Wellness preventive visit.  As a reminder, Annual Wellness Visits don't include a physical exam, and some assessments may be limited, especially if this visit is performed virtually. We may recommend an in-person follow-up visit with your provider if needed.  Visit Complete: Virtual I connected with  Melissa [name] on 05/01/24 by a audio enabled telemedicine application and verified that I am speaking with the correct person using two identifiers.  Patient Location: Home  Provider Location: Office/Clinic  I discussed the limitations of evaluation and management by telemedicine. The patient expressed understanding and agreed to proceed.  Vital Signs: Because this visit was a virtual/telehealth visit, some criteria may be missing or patient reported. Any vitals not documented were not able to be obtained and vitals that have been documented are patient reported.  VideoDeclined- This patient declined Librarian, academic. Therefore the visit was completed with audio only.  Persons Participating in Visit: Patient.  AWV Questionnaire: No: Patient Medicare AWV questionnaire was not completed prior to this visit.  Cardiac Risk Factors include: sedentary lifestyle;smoking/ tobacco exposure;obesity (BMI >30kg/m2)     Objective:    Today's Vitals   05/01/24 9072  Weight: 203 lb (92.1 kg)  Height: 5' 4 (1.626 m)  PainSc: 10-Worst pain ever  PainLoc: Back   Body mass index is 34.84 kg/m.     05/01/2024    9:25 AM 06/26/2023   11:22 AM 05/07/2021   11:20 AM 04/13/2021    3:55 PM 05/05/2019    9:12 AM 04/29/2019   11:24 AM 04/15/2018    1:05 PM  Advanced Directives  Does Patient Have a Medical Advance Directive? No No No Yes No No No   Does patient want to make changes to medical advance directive?    No -  Patient declined     Would patient like information on creating a medical advance directive? No - Patient declined  No - Patient declined No - Patient declined No - Patient declined No - Patient declined No - Patient declined      Data saved with a previous flowsheet row definition    Current Medications (verified) Outpatient Encounter Medications as of 05/01/2024  Medication Sig   buPROPion  (WELLBUTRIN  XL) 150 MG 24 hr tablet Take 150 mg by mouth every morning.   diclofenac  (FLECTOR ) 1.3 % PTCH Place 1 patch onto the skin 2 (two) times daily.   fluticasone  (FLONASE ) 50 MCG/ACT nasal spray SPRAY 2 SPRAYS INTO EACH NOSTRIL EVERY DAY   lidocaine  (LIDODERM ) 5 % Place 1 patch onto the skin daily. Remove & Discard patch within 12 hours or as directed by MD   methocarbamol  (ROBAXIN ) 750 MG tablet Take 1 tablet (750 mg total) by mouth every 8 (eight) hours as needed for muscle spasms.   [DISCONTINUED] Continuous Glucose Sensor (FREESTYLE LIBRE 3 PLUS SENSOR) MISC Change sensor every 15 days.   No facility-administered encounter medications on file as of 05/01/2024.    Allergies (verified) Tylenol  [acetaminophen ], Celebrex [celecoxib], Latex, Meloxicam , Other, Oxycodone , and Ultram [tramadol hcl]   History: Past Medical History:  Diagnosis Date   Anemia    Asthma    as a young adult - not for many years   Depression    Headache    migraines   Heart murmur    2004 - told that when  she had her son - no follow ups    Hidradenitis 2007   History of kidney stones    Seizures (HCC)    as a baby    Past Surgical History:  Procedure Laterality Date   APPENDECTOMY  1997   AXILLARY HIDRADENITIS EXCISION Bilateral 2007   BACK SURGERY     CESAREAN SECTION     CHOLECYSTECTOMY     INGUINAL HIDRADENITIS EXCISION  06-18-14   left   KIDNEY STONE SURGERY     KNEE ARTHROSCOPY Left 12/29/2015   Procedure: ARTHROSCOPY KNEE;  Surgeon: Kayla Pinal, MD;  Location: [facility] ORS;  Service: Orthopedics;   Laterality: Left;   KNEE SURGERY Left 1997   LAPAROSCOPY N/A 09/02/2016   Procedure: LAPAROSCOPY OPERATIVE;  Surgeon: Heather Penton, MD;  Location: [facility] ORS;  Service: Gynecology;  Laterality: N/A;   POSTERIOR LUMBAR FUSION N/A 03/25/2015   Procedure: POSTERIOR LUMBAR INTERBODY FUSION LUMBAR FOUR-FIVE;  Surgeon: Victory Gunnels, MD;  Location: [facility] OR;  Service: Neurosurgery;  Laterality: N/A;   TOTAL KNEE REVISION Left 02/10/2018   Procedure: TOTAL KNEE REVISION;  Surgeon: Liam Lerner, MD;  Location: [facility] OR;  Service: Orthopedics;  Laterality: Left;   TUBAL LIGATION     Family History  Problem Relation Age of Onset   Diabetes Mother    Hypertension Mother    Social History   Socioeconomic History   Marital status: Divorced    Spouse name: Not on file   Number of children: Not on file   Years of education: Not on file   Highest education level: Not on file  Occupational History   Not on file  Tobacco Use   Smoking status: Some Days    Current packs/day: 0.50    Average packs/day: 0.5 packs/day for 27.8 years (13.9 ttl pk-yrs)    Types: Cigarettes    Start date: 1998   Smokeless tobacco: Never   Tobacco comments:    smokes 1 in the morning occasionally  Vaping Use   Vaping status: Never Used  Substance and Sexual Activity   Alcohol use: Not Currently    Comment: occassional beer   Drug use: No   Sexual activity: Not on file  Other Topics Concern   Not on file  Social History Narrative   Not on file   Social Drivers of Health   Financial Resource Strain: Medium Risk (11/09/2023)   Received from [facility]   Overall Financial Resource Strain (CARDIA)    Difficulty of Paying Living Expenses: Somewhat hard  Food Insecurity: No Food Insecurity (11/09/2023)   Received from [facility]   Hunger Vital Sign    Within the past 12 months, you worried that your food would run out before you got the money to buy more.: Never true    Within the past 12 months, the food you bought  just didn't last and you didn't have money to get more.: Never true  Transportation Needs: No Transportation Needs (11/09/2023)   Received from [facility]   PRAPARE - Transportation    Lack of Transportation (Medical): No    Lack of Transportation (Non-Medical): No  Physical Activity: Sufficiently Active (05/01/2024)   Exercise Vital Sign    Days of Exercise per Week: 7 days    Minutes of Exercise per Session: 30 min  Stress: Stress Concern Present (05/01/2024)   [employer] - Occupational Stress Questionnaire    Feeling of Stress: Rather much  Social Connections: Socially Isolated (  11/09/2023)   Received from [facility]   Social Connection and Isolation Panel    In a typical week, how many times do you talk on the phone with family, friends, or neighbors?: Twice a week    How often do you get together with friends or relatives?: Twice a week    How often do you attend church or religious services?: Never    Do you belong to any clubs or organizations such as church groups, unions, fraternal or athletic groups, or school groups?: No    How often do you attend meetings of the clubs or organizations you belong to?: Never    Are you married, widowed, divorced, separated, never married, or living with a partner?: Divorced    Tobacco Counseling Ready to quit: Yes Counseling given: Yes Tobacco comments: smokes 1 in the morning occasionally    Clinical Intake:  Pre-visit preparation completed: Yes  Pain : 0-10 Pain Score: 10-Worst pain ever Pain Type: Chronic pain Pain Location: Back Pain Orientation: Lower Pain Descriptors / Indicators: Constant Pain Onset: More than a month ago Pain Frequency: Constant     BMI - recorded: 34.84 Nutritional Risks: None Diabetes: No  No results found for: HGBA1C   How often do you need to have someone help you when you read instructions, pamphlets, or other written materials from your doctor or  pharmacy?: 1 - Never  Interpreter Needed?: No  Information entered by :: Ellina Sivertsen W CMA (AAMA)   Activities of Daily Living     05/01/2024    9:34 AM  In your present state of health, do you have any difficulty performing the following activities:  Hearing? 0  Vision? 0  Difficulty concentrating or making decisions? 0  Walking or climbing stairs? 0  Dressing or bathing? 0  Doing errands, shopping? 0  Preparing Food and eating ? N  Using the Toilet? N  In the past six months, have you accidently leaked urine? N  Do you have problems with loss of bowel control? N  Managing your Medications? N  Managing your Finances? N  Housekeeping or managing your Housekeeping? N    Patient Care Team: Bevely Doffing, FNP as PCP - General (Family Medicine) Sankar, Seeplaputhur G, MD (General Surgery) P.A., Sheralyn Flock, MD as Referring Physician (Cardiology)  I have updated your Care Teams any recent Medical Services you may have received from other providers in the past year.     Assessment:   This is a routine wellness examination for Melissa [name].  Hearing/Vision screen Hearing Screening - Comments:: Patient denies any hearing difficulties.   Vision Screening - Comments:: Patient is not up to date on yearly eye exams.  List of resources provided to patient   Goals Addressed               This Visit's Progress     Remain active and healthy (pt-stated)          Depression Screen  Patient had an intake appt at [facility] in [place]. She didn not have a follow up scheduled. Encouraged patient to call and schedule that follow up due to elevated PHQ 9 score today    05/01/2024    9:37 AM 12/23/2023    8:30 AM  PHQ 2/9 Scores  PHQ - 2 Score 5 1  PHQ- 9 Score 14 6     Fall Risk     05/01/2024    9:30 AM 12/23/2023    8:29 AM  Fall Risk  Falls in the past year? 1 1  Number falls in past yr: 0 1  Injury with Fall? 0 1  Risk for fall due to : No Fall Risks History of fall(s)   Follow up Falls evaluation completed;Education provided;Falls prevention discussed Falls evaluation completed    MEDICARE RISK AT HOME:  Medicare Risk at Home Any stairs in or around the home?: Yes If so, are there any without handrails?: No Home free of loose throw rugs in walkways, pet beds, electrical cords, etc?: Yes Adequate lighting in your home to reduce risk of falls?: Yes Life alert?: No Use of a cane, walker or w/c?: No Grab bars in the bathroom?: No Shower chair or bench in shower?: No Elevated toilet seat or a handicapped toilet?: No  TIMED UP AND GO:  Was the test performed?  No  Cognitive Function: 6CIT completed        05/01/2024    9:36 AM  6CIT Screen  What Year? 0 points  What month? 0 points  What time? 0 points  Count back from 20 0 points  Months in reverse 0 points  Repeat phrase 0 points  Total Score 0 points    Immunizations Immunization History  Administered Date(s) Administered   Influenza Inj Mdck Quad Pf 08/31/2021, 03/12/2023   Influenza,inj,Quad PF,6+ Mos 07/08/2022   Moderna Sars-Covid-2 Vaccination 03/12/2023   PNEUMOCOCCAL CONJUGATE-20 08/31/2021   PPD Test 10/13/2021    Screening Tests Health Maintenance  Topic Date Due   HIV Screening  Never done   Hepatitis C Screening  Never done   DTaP/Tdap/Td (1 - Tdap) Never done   Hepatitis B Vaccines 19-59 Average Risk (1 of 3 - 19+ 3-dose series) Never done   Cervical Cancer Screening (HPV/Pap Cotest)  Never done   Mammogram  Never done   Colonoscopy  Never done   Medicare Annual Wellness (AWV)  11/05/2023   Influenza Vaccine  02/03/2024   COVID-19 Vaccine (2 - 2025-26 season) 03/05/2024   Pneumococcal Vaccine  Completed   HPV VACCINES  Aged Out   Meningococcal B Vaccine  Aged Out    Health Maintenance Health Maintenance Due  Topic Date Due   HIV Screening  Never done   Hepatitis C Screening  Never done   DTaP/Tdap/Td (1 - Tdap) Never done   Hepatitis B Vaccines 19-59  Average Risk (1 of 3 - 19+ 3-dose series) Never done   Cervical Cancer Screening (HPV/Pap Cotest)  Never done   Mammogram  Never done   Colonoscopy  Never done   Medicare Annual Wellness (AWV)  11/05/2023   Influenza Vaccine  02/03/2024   COVID-19 Vaccine (2 - 2025-26 season) 03/05/2024   Health Maintenance Items Addressed: Vaccines Due: HIV Screening, Hep C, Mammogram ordered  Additional Screening:  Vision Screening: Recommended annual ophthalmology exams for early detection of glaucoma and other disorders of the eye. Would you like a referral to an eye doctor? No    Dental Screening: Recommended annual dental exams for proper oral hygiene  Community Resource Referral / Chronic Care Management: CRR required this visit?  No   CCM required this visit?  No   Plan:    I have personally reviewed and noted the following in the patient's chart:   Medical and social history Use of alcohol, tobacco or illicit drugs  Current medications and supplements including opioid prescriptions. Patient is not currently taking opioid prescriptions. Functional ability and status Nutritional status Physical activity Advanced directives List of other physicians Hospitalizations,  surgeries, and ER visits in previous 12 months Vitals Screenings to include cognitive, depression, and falls Referrals and appointments  In addition, I have reviewed and discussed with patient certain preventive protocols, quality metrics, and best practice recommendations. A written personalized care plan for preventive services as well as general preventive health recommendations were provided to patient.   Torie Priebe, CMA   05/01/2024   After Visit Summary: (MyChart) Due to this being a telephonic visit, the after visit summary with patients personalized plan was offered to patient via MyChart   Notes: Nothing significant to report at this time.

## 2024-05-24 ENCOUNTER — Encounter (HOSPITAL_COMMUNITY): Payer: Self-pay

## 2024-05-24 ENCOUNTER — Ambulatory Visit (HOSPITAL_COMMUNITY)
Admission: RE | Admit: 2024-05-24 | Discharge: 2024-05-24 | Disposition: A | Discharge status: Home / Self Care | Source: Ambulatory Visit

## 2024-05-24 DIAGNOSIS — Z1231 Encounter for screening mammogram for malignant neoplasm of breast: Secondary | ICD-10-CM | POA: Insufficient documentation

## 2024-06-10 ENCOUNTER — Emergency Department (HOSPITAL_COMMUNITY)
Admission: EM | Admit: 2024-06-10 | Discharge: 2024-06-10 | Disposition: A | Discharge status: Home / Self Care | Attending: Emergency Medicine | Admitting: Emergency Medicine

## 2024-06-10 ENCOUNTER — Encounter (HOSPITAL_COMMUNITY): Payer: Self-pay | Admitting: *Deleted

## 2024-06-10 ENCOUNTER — Other Ambulatory Visit: Payer: Self-pay

## 2024-06-10 DIAGNOSIS — M545 Low back pain, unspecified: Secondary | ICD-10-CM

## 2024-06-10 MED ORDER — PREDNISONE 20 MG PO TABS
60.0000 mg | ORAL_TABLET | Freq: Once | ORAL | Status: AC
  Administered 2024-06-10: 60 mg via ORAL
  Filled 2024-06-10: qty 3

## 2024-06-10 MED ORDER — LIDOCAINE 5 % EX PTCH: 1.0000 | MEDICATED_PATCH | CUTANEOUS | 0 refills | Status: AC

## 2024-06-10 MED ORDER — LIDOCAINE 5 % EX PTCH
2.0000 | MEDICATED_PATCH | CUTANEOUS | Status: DC
  Administered 2024-06-10: 2 via TRANSDERMAL
  Filled 2024-06-10: qty 2

## 2024-06-10 MED ORDER — KETOROLAC TROMETHAMINE 15 MG/ML IJ SOLN
15.0000 mg | Freq: Once | INTRAMUSCULAR | Status: AC
  Administered 2024-06-10: 15 mg via INTRAMUSCULAR
  Filled 2024-06-10: qty 1

## 2024-06-10 MED ORDER — LORAZEPAM 1 MG PO TABS
1.0000 mg | ORAL_TABLET | Freq: Once | ORAL | Status: AC
  Administered 2024-06-10: 1 mg via ORAL
  Filled 2024-06-10: qty 1

## 2024-06-10 MED ORDER — PREDNISONE 10 MG (21) PO TBPK: ORAL_TABLET | Freq: Every day | ORAL | 0 refills | Status: AC
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
## 2024-06-10 NOTE — ED Notes (Addendum)
Pt wheeled to waiting room. Pt verbalized understanding of discharge instructions.   

## 2024-06-10 NOTE — ED Triage Notes (Signed)
 C/o lower back pain with radiation to right buttocks and down right leg. , drove self to ed states pain started thanksgiving week. States she was putting in a commode , history of rods in her back 5 years ago.

## 2024-06-10 NOTE — Discharge Instructions (Signed)
Back Pain:  Back pain is very common.  The pain often gets better over time.  The cause of back pain is usually not dangerous.  Most people can learn to manage their back pain on their own.  However if you develop severe or worsening pain, low back pain with fever, numbness, weakness or inability to walk or urinate/stool, you should return to the ER immediately.  Please follow up with your doctor this week for a recheck if still having symptoms.  Low back pain is discomfort in the lower back that may be due to injuries to muscles and ligaments around the spine.  Occasionally, it may be caused by a a problem to a part of the spine called a disc. The pain may last several days or a week;  However, most patients get completely well in 4 weeks.  Medications are also useful to help with pain control.  A commonly prescribed medications includes acetaminophen.  This medication is generally safe, though you should not take more than 8 of the extra strength (500mg) pills a day.  Non steroidal anti inflammatory medications including Ibuprofen and naproxen;  These medications help both pain and swelling and are very useful in treating back pain.  They should be taken with food, as they can cause stomach upset, and more seriously, stomach bleeding.    Be aware that if you develop new symptoms, such as a fever, leg weakness, difficulty with or loss of control of your urine or bowels, abdominal pain, or more severe pain, you will need to seek medical attention and  / or return to the Emergency department.    Home Care Stay active.  Start with short walks on flat ground if you can.  Try to walk farther each day. Do not sit, drive or stand in one place for more than 30 minutes.  Do not stay in bed. Do not avoid exercise or work.  Activity can help your back heal faster. Be careful when you bend or lift an object.  Bend at your knees, keep the object close to you, and do not twist. Sleep on a firm mattress.  Lie  on your side, and bend your knees.  If you lie on your back, put a pillow under your knees. Only take medicines as told by your doctor. Put ice on the injured area. Put ice in a plastic bag Place a towel between your skin and the bag Leave the ice on for 15-20 minutes, 3-4 times a day for the first 2-3 days. 210 After that, you can switch between ice and heat packs. Ask your doctor about back exercises or massage. Avoid feeling anxious or stressed.  Find good ways to deal with stress, such as exercise.  Get Help Right Way If: Your pain does not go away with rest or medicine. Your pain does not go away in 1 week. You have new problems. You do not feel well. The pain spreads into your legs. You cannot control when you poop (bowel movement) or pee (urinate) You feel sick to your stomach (nauseous) or throw up (vomit) You have belly (abdominal) pain. You feel like you may pass out (faint). If you develop a fever.  Make Sure you: Understand these instructions. Watch your condition Get help right away if you are not doing well or get worse.   

## 2024-06-25 ENCOUNTER — Ambulatory Visit

## 2025-05-06 ENCOUNTER — Ambulatory Visit
